# Patient Record
Sex: Female | Born: 1935
Health system: Southern US, Community
[De-identification: ages and names within clinical notes are randomized; demographics above are authoritative.]

## PROBLEM LIST (undated history)

## (undated) DIAGNOSIS — K2980 Duodenitis without bleeding: Secondary | ICD-10-CM

## (undated) DIAGNOSIS — E785 Hyperlipidemia, unspecified: Secondary | ICD-10-CM

## (undated) DIAGNOSIS — R0989 Other specified symptoms and signs involving the circulatory and respiratory systems: Secondary | ICD-10-CM

## (undated) DIAGNOSIS — K449 Diaphragmatic hernia without obstruction or gangrene: Secondary | ICD-10-CM

## (undated) DIAGNOSIS — B9681 Helicobacter pylori [H. pylori] as the cause of diseases classified elsewhere: Secondary | ICD-10-CM

## (undated) DIAGNOSIS — D126 Benign neoplasm of colon, unspecified: Secondary | ICD-10-CM

## (undated) DIAGNOSIS — K2970 Gastritis, unspecified, without bleeding: Secondary | ICD-10-CM

## (undated) DIAGNOSIS — C4491 Basal cell carcinoma of skin, unspecified: Secondary | ICD-10-CM

## (undated) DIAGNOSIS — Z9289 Personal history of other medical treatment: Secondary | ICD-10-CM

## (undated) DIAGNOSIS — I2510 Atherosclerotic heart disease of native coronary artery without angina pectoris: Secondary | ICD-10-CM

## (undated) DIAGNOSIS — K279 Peptic ulcer, site unspecified, unspecified as acute or chronic, without hemorrhage or perforation: Secondary | ICD-10-CM

## (undated) DIAGNOSIS — N6019 Diffuse cystic mastopathy of unspecified breast: Secondary | ICD-10-CM

## (undated) DIAGNOSIS — I219 Acute myocardial infarction, unspecified: Secondary | ICD-10-CM

## (undated) DIAGNOSIS — C4492 Squamous cell carcinoma of skin, unspecified: Secondary | ICD-10-CM

## (undated) DIAGNOSIS — K5790 Diverticulosis of intestine, part unspecified, without perforation or abscess without bleeding: Secondary | ICD-10-CM

## (undated) DIAGNOSIS — K219 Gastro-esophageal reflux disease without esophagitis: Secondary | ICD-10-CM

## (undated) HISTORY — DX: Other specified symptoms and signs involving the circulatory and respiratory systems: R09.89

## (undated) HISTORY — DX: Helicobacter pylori (H. pylori) as the cause of diseases classified elsewhere: B96.81

## (undated) HISTORY — PX: DILATION AND CURETTAGE OF UTERUS: SHX78

## (undated) HISTORY — DX: Diffuse cystic mastopathy of unspecified breast: N60.19

## (undated) HISTORY — PX: BASAL CELL CARCINOMA EXCISION: SHX1214

## (undated) HISTORY — DX: Gastro-esophageal reflux disease without esophagitis: K21.9

## (undated) HISTORY — DX: Personal history of other medical treatment: Z92.89

## (undated) HISTORY — DX: Peptic ulcer, site unspecified, unspecified as acute or chronic, without hemorrhage or perforation: K27.9

## (undated) HISTORY — DX: Hyperlipidemia, unspecified: E78.5

## (undated) HISTORY — DX: Benign neoplasm of colon, unspecified: D12.6

## (undated) HISTORY — DX: Diverticulosis of intestine, part unspecified, without perforation or abscess without bleeding: K57.90

## (undated) HISTORY — DX: Atherosclerotic heart disease of native coronary artery without angina pectoris: I25.10

## (undated) HISTORY — DX: Acute myocardial infarction, unspecified: I21.9

## (undated) HISTORY — DX: Diaphragmatic hernia without obstruction or gangrene: K44.9

## (undated) HISTORY — DX: Basal cell carcinoma of skin, unspecified: C44.91

## (undated) HISTORY — DX: Duodenitis without bleeding: K29.80

## (undated) HISTORY — DX: Gastritis, unspecified, without bleeding: K29.70

---
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
## 1982-10-08 HISTORY — PX: CHOLECYSTECTOMY: SHX55

## 1991-10-09 DIAGNOSIS — I219 Acute myocardial infarction, unspecified: Secondary | ICD-10-CM

## 1991-10-09 HISTORY — PX: ANGIOPLASTY: SHX39

## 1991-10-09 HISTORY — DX: Acute myocardial infarction, unspecified: I21.9

## 1998-02-07 ENCOUNTER — Other Ambulatory Visit: Admission: RE | Admit: 1998-02-07 | Discharge: 1998-02-07 | Payer: Self-pay | Admitting: *Deleted

## 1998-04-15 ENCOUNTER — Ambulatory Visit (HOSPITAL_COMMUNITY): Admission: RE | Admit: 1998-04-15 | Discharge: 1998-04-15 | Payer: Self-pay | Admitting: *Deleted

## 1998-09-21 ENCOUNTER — Other Ambulatory Visit: Admission: RE | Admit: 1998-09-21 | Discharge: 1998-09-21 | Payer: Self-pay | Admitting: Obstetrics and Gynecology

## 1999-03-21 ENCOUNTER — Encounter (INDEPENDENT_AMBULATORY_CARE_PROVIDER_SITE_OTHER): Payer: Self-pay | Admitting: *Deleted

## 1999-03-21 ENCOUNTER — Emergency Department (HOSPITAL_COMMUNITY): Admission: EM | Admit: 1999-03-21 | Discharge: 1999-03-21 | Payer: Self-pay | Admitting: Emergency Medicine

## 1999-03-22 ENCOUNTER — Other Ambulatory Visit: Admission: RE | Admit: 1999-03-22 | Discharge: 1999-03-22 | Payer: Self-pay | Admitting: Obstetrics and Gynecology

## 1999-05-10 ENCOUNTER — Other Ambulatory Visit: Admission: RE | Admit: 1999-05-10 | Discharge: 1999-05-10 | Payer: Self-pay | Admitting: Obstetrics and Gynecology

## 1999-05-10 ENCOUNTER — Encounter (INDEPENDENT_AMBULATORY_CARE_PROVIDER_SITE_OTHER): Payer: Self-pay | Admitting: Specialist

## 1999-10-10 ENCOUNTER — Other Ambulatory Visit: Admission: RE | Admit: 1999-10-10 | Discharge: 1999-10-10 | Payer: Self-pay | Admitting: *Deleted

## 2000-01-17 ENCOUNTER — Other Ambulatory Visit: Admission: RE | Admit: 2000-01-17 | Discharge: 2000-01-17 | Payer: Self-pay | Admitting: *Deleted

## 2000-10-08 DIAGNOSIS — D126 Benign neoplasm of colon, unspecified: Secondary | ICD-10-CM

## 2000-10-08 HISTORY — DX: Benign neoplasm of colon, unspecified: D12.6

## 2001-01-06 ENCOUNTER — Other Ambulatory Visit: Admission: RE | Admit: 2001-01-06 | Discharge: 2001-01-06 | Payer: Self-pay | Admitting: *Deleted

## 2001-03-07 ENCOUNTER — Other Ambulatory Visit: Admission: RE | Admit: 2001-03-07 | Discharge: 2001-03-07 | Payer: Self-pay | Admitting: Gastroenterology

## 2001-03-07 ENCOUNTER — Encounter (INDEPENDENT_AMBULATORY_CARE_PROVIDER_SITE_OTHER): Payer: Self-pay | Admitting: Specialist

## 2002-01-06 ENCOUNTER — Other Ambulatory Visit: Admission: RE | Admit: 2002-01-06 | Discharge: 2002-01-06 | Payer: Self-pay | Admitting: Obstetrics and Gynecology

## 2002-10-08 DIAGNOSIS — D126 Benign neoplasm of colon, unspecified: Secondary | ICD-10-CM

## 2002-10-08 HISTORY — DX: Benign neoplasm of colon, unspecified: D12.6

## 2003-01-19 ENCOUNTER — Encounter (INDEPENDENT_AMBULATORY_CARE_PROVIDER_SITE_OTHER): Payer: Self-pay

## 2003-01-19 ENCOUNTER — Ambulatory Visit (HOSPITAL_COMMUNITY): Admission: RE | Admit: 2003-01-19 | Discharge: 2003-01-19 | Payer: Self-pay | Admitting: Obstetrics and Gynecology

## 2003-02-08 ENCOUNTER — Other Ambulatory Visit: Admission: RE | Admit: 2003-02-08 | Discharge: 2003-02-08 | Payer: Self-pay | Admitting: Obstetrics and Gynecology

## 2004-08-24 ENCOUNTER — Ambulatory Visit: Payer: Self-pay | Admitting: Family Medicine

## 2004-08-24 ENCOUNTER — Encounter: Admission: RE | Admit: 2004-08-24 | Discharge: 2004-08-24 | Payer: Self-pay | Admitting: Family Medicine

## 2004-09-11 ENCOUNTER — Ambulatory Visit: Payer: Self-pay | Admitting: Family Medicine

## 2005-10-04 ENCOUNTER — Ambulatory Visit: Payer: Self-pay | Admitting: Internal Medicine

## 2005-10-20 ENCOUNTER — Emergency Department (HOSPITAL_COMMUNITY): Admission: EM | Admit: 2005-10-20 | Discharge: 2005-10-20 | Payer: Self-pay | Admitting: Emergency Medicine

## 2005-10-20 ENCOUNTER — Ambulatory Visit: Payer: Self-pay | Admitting: Internal Medicine

## 2006-01-08 ENCOUNTER — Ambulatory Visit: Payer: Self-pay | Admitting: Internal Medicine

## 2006-01-30 ENCOUNTER — Ambulatory Visit: Payer: Self-pay | Admitting: Gastroenterology

## 2006-02-11 ENCOUNTER — Ambulatory Visit: Payer: Self-pay | Admitting: Gastroenterology

## 2006-04-12 ENCOUNTER — Ambulatory Visit: Payer: Self-pay | Admitting: Internal Medicine

## 2008-05-27 ENCOUNTER — Ambulatory Visit: Payer: Self-pay | Admitting: Family Medicine

## 2008-05-27 DIAGNOSIS — M549 Dorsalgia, unspecified: Secondary | ICD-10-CM | POA: Insufficient documentation

## 2008-05-27 DIAGNOSIS — E785 Hyperlipidemia, unspecified: Secondary | ICD-10-CM

## 2008-05-27 LAB — CONVERTED CEMR LAB
Bilirubin Urine: NEGATIVE
Blood in Urine, dipstick: NEGATIVE
Glucose, Urine, Semiquant: NEGATIVE
Ketones, urine, test strip: NEGATIVE
Nitrite: NEGATIVE
Protein, U semiquant: NEGATIVE
Specific Gravity, Urine: 1.025
Urobilinogen, UA: 0.2
pH: 5

## 2008-05-28 ENCOUNTER — Telehealth (INDEPENDENT_AMBULATORY_CARE_PROVIDER_SITE_OTHER): Payer: Self-pay | Admitting: *Deleted

## 2008-05-31 ENCOUNTER — Telehealth (INDEPENDENT_AMBULATORY_CARE_PROVIDER_SITE_OTHER): Payer: Self-pay | Admitting: *Deleted

## 2008-06-21 ENCOUNTER — Telehealth (INDEPENDENT_AMBULATORY_CARE_PROVIDER_SITE_OTHER): Payer: Self-pay | Admitting: *Deleted

## 2008-06-23 ENCOUNTER — Encounter: Admission: RE | Admit: 2008-06-23 | Discharge: 2008-06-23 | Payer: Self-pay | Admitting: Family Medicine

## 2008-06-24 ENCOUNTER — Encounter (INDEPENDENT_AMBULATORY_CARE_PROVIDER_SITE_OTHER): Payer: Self-pay | Admitting: *Deleted

## 2008-07-05 ENCOUNTER — Telehealth: Payer: Self-pay | Admitting: Family Medicine

## 2008-07-13 ENCOUNTER — Encounter: Payer: Self-pay | Admitting: Family Medicine

## 2008-08-19 ENCOUNTER — Ambulatory Visit: Payer: Self-pay | Admitting: Internal Medicine

## 2008-11-26 ENCOUNTER — Encounter: Payer: Self-pay | Admitting: Family Medicine

## 2009-09-19 ENCOUNTER — Ambulatory Visit: Payer: Self-pay | Admitting: Family Medicine

## 2009-09-19 DIAGNOSIS — K449 Diaphragmatic hernia without obstruction or gangrene: Secondary | ICD-10-CM | POA: Insufficient documentation

## 2009-09-19 DIAGNOSIS — R634 Abnormal weight loss: Secondary | ICD-10-CM | POA: Insufficient documentation

## 2009-09-19 DIAGNOSIS — Z8711 Personal history of peptic ulcer disease: Secondary | ICD-10-CM | POA: Insufficient documentation

## 2009-09-20 ENCOUNTER — Encounter (INDEPENDENT_AMBULATORY_CARE_PROVIDER_SITE_OTHER): Payer: Self-pay | Admitting: *Deleted

## 2009-09-21 ENCOUNTER — Encounter (INDEPENDENT_AMBULATORY_CARE_PROVIDER_SITE_OTHER): Payer: Self-pay | Admitting: *Deleted

## 2009-09-21 LAB — CONVERTED CEMR LAB
ALT: 17 units/L (ref 0–35)
AST: 25 units/L (ref 0–37)
Albumin: 4.1 g/dL (ref 3.5–5.2)
Alkaline Phosphatase: 48 units/L (ref 39–117)
BUN: 15 mg/dL (ref 6–23)
Basophils Absolute: 0.1 10*3/uL (ref 0.0–0.1)
Basophils Relative: 1.5 % (ref 0.0–3.0)
Bilirubin, Direct: 0.1 mg/dL (ref 0.0–0.3)
CO2: 30 meq/L (ref 19–32)
Calcium: 9.9 mg/dL (ref 8.4–10.5)
Chloride: 104 meq/L (ref 96–112)
Creatinine, Ser: 0.9 mg/dL (ref 0.4–1.2)
Eosinophils Absolute: 0.2 10*3/uL (ref 0.0–0.7)
Eosinophils Relative: 3.5 % (ref 0.0–5.0)
GFR calc non Af Amer: 64.64 mL/min (ref >60)
Glucose, Bld: 98 mg/dL (ref 70–99)
HCT: 40.7 % (ref 36.0–46.0)
Hemoglobin: 13.6 g/dL (ref 12.0–15.0)
Lymphocytes Relative: 32.6 % (ref 12.0–46.0)
Lymphs Abs: 1.8 10*3/uL (ref 0.7–4.0)
MCHC: 33.4 g/dL (ref 30.0–36.0)
MCV: 98.7 fL (ref 78.0–100.0)
Monocytes Absolute: 0.8 10*3/uL (ref 0.1–1.0)
Monocytes Relative: 14.1 % — ABNORMAL HIGH (ref 3.0–12.0)
Neutro Abs: 2.5 10*3/uL (ref 1.4–7.7)
Neutrophils Relative %: 48.3 % (ref 43.0–77.0)
Platelets: 146 10*3/uL — ABNORMAL LOW (ref 150.0–400.0)
Potassium: 3.9 meq/L (ref 3.5–5.1)
RBC: 4.13 M/uL (ref 3.87–5.11)
RDW: 12.7 % (ref 11.5–14.6)
Sodium: 142 meq/L (ref 135–145)
TSH: 1.32 microintl units/mL (ref 0.35–5.50)
Total Bilirubin: 0.8 mg/dL (ref 0.3–1.2)
Total Protein: 7.2 g/dL (ref 6.0–8.3)
WBC: 5.4 10*3/uL (ref 4.5–10.5)

## 2009-10-12 ENCOUNTER — Ambulatory Visit: Payer: Self-pay | Admitting: Family Medicine

## 2009-10-12 ENCOUNTER — Encounter (INDEPENDENT_AMBULATORY_CARE_PROVIDER_SITE_OTHER): Payer: Self-pay | Admitting: *Deleted

## 2009-10-12 LAB — CONVERTED CEMR LAB
OCCULT 1: NEGATIVE
OCCULT 2: NEGATIVE
OCCULT 3: NEGATIVE

## 2009-10-14 DIAGNOSIS — K5730 Diverticulosis of large intestine without perforation or abscess without bleeding: Secondary | ICD-10-CM | POA: Insufficient documentation

## 2009-10-14 DIAGNOSIS — Z860100 Personal history of colon polyps, unspecified: Secondary | ICD-10-CM | POA: Insufficient documentation

## 2009-10-18 ENCOUNTER — Ambulatory Visit: Payer: Self-pay | Admitting: Gastroenterology

## 2009-10-18 DIAGNOSIS — R1013 Epigastric pain: Secondary | ICD-10-CM | POA: Insufficient documentation

## 2009-10-18 DIAGNOSIS — Z9089 Acquired absence of other organs: Secondary | ICD-10-CM | POA: Insufficient documentation

## 2009-10-19 ENCOUNTER — Ambulatory Visit: Payer: Self-pay | Admitting: Gastroenterology

## 2009-10-19 DIAGNOSIS — K2970 Gastritis, unspecified, without bleeding: Secondary | ICD-10-CM | POA: Insufficient documentation

## 2009-10-19 DIAGNOSIS — K2990 Gastroduodenitis, unspecified, without bleeding: Secondary | ICD-10-CM

## 2009-10-19 LAB — CONVERTED CEMR LAB: UREASE: NEGATIVE

## 2009-10-26 HISTORY — PX: CATARACT EXTRACTION: SUR2

## 2009-11-01 ENCOUNTER — Telehealth: Payer: Self-pay | Admitting: Gastroenterology

## 2009-11-09 HISTORY — PX: CATARACT EXTRACTION: SUR2

## 2009-11-28 ENCOUNTER — Encounter: Payer: Self-pay | Admitting: Family Medicine

## 2010-10-11 ENCOUNTER — Encounter: Payer: Self-pay | Admitting: Family Medicine

## 2010-10-29 ENCOUNTER — Encounter (HOSPITAL_COMMUNITY): Payer: Self-pay | Admitting: Oncology

## 2010-11-07 NOTE — Procedures (Signed)
Summary: Upper Endoscopy  Patient: [name] Note: All result statuses are Final unless otherwise noted.  Tests: (1) Upper Endoscopy (EGD)   EGD Upper Endoscopy       DONE      [facility]     [address]     [place], [place]  [address]           ENDOSCOPY PROCEDURE REPORT           PATIENT:  Rachel Nichols, Rachel Nichols  MR#:  [id number]     BIRTHDATE:  06/04/1931, 78 yrs. old  GENDER:  female           ENDOSCOPIST:  Vania Rea. Jarold Motto, MD, [facility]     Referred by:           PROCEDURE DATE:  10/19/2009     PROCEDURE:  EGD with biopsy     ASA CLASS:  Class II     INDICATIONS:  abdominal pain           MEDICATIONS:   Fentanyl 25 mcg IV, Versed 2 mg IV, glycopyrrolate     (Robinal) 0.2 mg IV     TOPICAL ANESTHETIC:  Exactacain Spray           DESCRIPTION OF PROCEDURE:   After the risks benefits and     alternatives of the procedure were thoroughly explained, informed     consent was obtained.  The LB [id number]-[id number] [id number] endoscope was     introduced through the mouth and advanced to the second portion of     the duodenum, without limitations.  The instrument was slowly     withdrawn as the mucosa was fully examined.     <<PROCEDUREIMAGES>>           Duodenitis was found in the bulb of the duodenum. LINEAR EROSION     IN BULB AND SCARRING WITH DEFORMITY OF BULB.  Moderate gastritis     was found in the fundus. CLO AND REGULAR BIOPSIES DONE FOR     H.PYLORI.  Normal duodenal folds were noted.  The esophagus and     gastroesophageal junction were completely normal in appearance.     Retroflexed views revealed no masses.    The scope was then withdrawn     from the patient and the procedure completed.           COMPLICATIONS:  None           ENDOSCOPIC IMPRESSION:     1) Duodenitis in the bulb of duodenum     2) Moderate gastritis in the fundus     3) Normal duodenal folds     4) Normal esophagus     5) No masses     RECURRENT DUODENAL ULCER DISEASE.PROBABLE H.PYLORI.  RECOMMENDATIONS:     1) continue PPI     2) follow-up of helicobacter pylori status, treat if indicated     3) Rx CLO if positive           REPEAT EXAM:  No           ______________________________     Vania Rea. Jarold Motto, MD, [facility]           CC:  Lelon Perla, DO           n.     eSIGNED:   Vania Rea. Navpreet Szczygiel at 10/19/2009 02:22 PM           Lerry Liner, [phone]  Note: An exclamation mark Marland Kitchen)  indicates a result that was not dispersed into the flowsheet. Document Creation Date: 10/19/2009 2:22 PM _______________________________________________________________________  (1) Order result status: Final Collection or observation date-time: 10/19/2009 14:14 Requested date-time:  Receipt date-time:  Reported date-time:  Referring Physician:   Ordering Physician: Sheryn Bison [phone]) Specimen Source:  Source: Launa Grill Order Number: [id number] Lab site:

## 2010-11-07 NOTE — Assessment & Plan Note (Signed)
Summary: WEIGHTLOSS AND HERNIA--CH   History of Present Illness Visit Type: Initial Consult Primary GI MD: Sheryn Bison MD FACP FAGA Primary Provider: Loreen Freud, MD Requesting Provider: Loreen Freud, MD Chief Complaint: Patient referred for epigastric pain and increased acid reflux. Dr. Laury Axon gave her samples of Dexilant which she has been taking since her last office visit, with the exception of the last 6 days. She stopped the Dexilant. She states she has had a 5lb weight loss since summer. She has a history of hiatal hernia and doudenal ulcer in the the 70s.  History of Present Illness:   This patient is a pleasant 77 year old white female referred through the courtesy of Dr. Laury Axon for evaluation of epigastric abdominal pain which began in late November and lasted approximately 6 weeks and is responsive to PPI therapy. Her pain was a dull in sensation in epigastric area without radiation, relieved by antacids and food, and was associated with nocturnal awakening. She denies use of aspirin or salicylates. She was told she had a peptic ulcer by upper GI series in the 1970s. She denies a history of gastrointestinal hemorrhage. Recently there has been 4-5 pound weight loss despite good p.o. intake. There has been no melena, hematochezia, or hepatobiliary complaints. Cholecystectomy was performed in 1984. She is up-to-date on colonoscopy exams and has none diverticulosis coli.  Medical problems include Rosacea with weekly administration of minocycline, hypertensive cardiovascular disease with previous angioplasty, hypertension, hyperlipidemia, and osteopenia.   GI Review of Systems    Reports abdominal pain and  weight loss.     Location of  Abdominal pain: epigastric area. Weight loss of 5 pounds over 6 months.   Denies acid reflux, belching, bloating, chest pain, dysphagia with liquids, dysphagia with solids, heartburn, loss of appetite, nausea, vomiting, vomiting blood, and  weight gain.         Denies anal fissure, black tarry stools, change in bowel habit, constipation, diarrhea, diverticulosis, fecal incontinence, heme positive stool, hemorrhoids, irritable bowel syndrome, jaundice, light color stool, liver problems, rectal bleeding, and  rectal pain.    Current Medications (verified): 1)  Atenolol 25 Mg  Tabs (Atenolol) .... Take 1/2 Tab Once Daily 2)  Zetia 10 Mg  Tabs (Ezetimibe) .... Take 1 Tab Once Daily 3)  Protegra Cardio   Tabs (Multiple Vitamins-Minerals) .... Take 1 Tab Once Daily 4)  Vitamin D 1000 Unit Tabs (Cholecalciferol) .... Take One By Mouth Once Daily 5)  Minocycline Hcl 100 Mg  Caps (Minocycline Hcl) .... Take 2 Tabs Weekly 6)  Crestor 5 Mg Tabs (Rosuvastatin Calcium) .Marland Kitchen.. 1 By Mouth Daily 7)  Dexilant 60 Mg Cpdr (Dexlansoprazole) .Marland Kitchen.. 1 By Mouth Once Daily  Allergies (verified): 1)  ! Ultram 2)  ! Cardizem  Past History:  Past medical, surgical, family and social histories (including risk factors) reviewed for relevance to current acute and chronic problems.  Past Medical History: Reviewed history from 10/14/2009 and no changes required. Hyperlipidemia Myocardial infarction, hx of (1993)--cardiolyte stress--- Dr Tresa Endo  Current Problems:  DIVERTICULOSIS, COLON (ICD-562.10) COLONIC POLYPS, ADENOMATOUS, HX OF (ICD-V12.72) WEIGHT LOSS (ICD-783.21) PUD, HX OF (ICD-V12.71) HIATAL HERNIA WITH REFLUX (ICD-553.3) HYPERLIPIDEMIA (ICD-272.4) BACK PAIN (ICD-724.5)  Past Surgical History: angioplasty 1993 Cholecystectomy 1984  Family History: Reviewed history from 09/19/2009 and no changes required. Brother ---bacteria attacked heart and lungs Family History of Breast Cancer: maternal aunts Family History of Inflammatory Bowel Disease: brother Family History of Heart Disease: brother, sister  Social History: Reviewed history from 05/27/2008 and no changes  required. 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  The patient denies allergy/sinus, anemia, anxiety-new, blood in urine, breast changes/lumps, change in vision, confusion, cough, coughing up blood, depression-new, fainting, fatigue, fever, headaches-new, hearing problems, heart rhythm changes, itching, menstrual pain, muscle pains/cramps, night sweats, nosebleeds, pregnancy symptoms, shortness of breath, skin rash, sleeping problems, sore throat, swelling of feet/legs, swollen lymph glands, thirst - excessive , urination - excessive , urination changes/pain, urine leakage, vision changes, and voice change.   General:  Denies fever, chills, sweats, anorexia, fatigue, weakness, malaise, weight loss, and sleep disorder. Eyes:  Denies blurring, diplopia, irritation, discharge, vision loss, scotoma, eye pain, and photophobia. ENT:  Denies earache, ear discharge, tinnitus, decreased hearing, nasal congestion, loss of smell, nosebleeds, sore throat, hoarseness, and difficulty swallowing. CV:  Denies chest pains, angina, palpitations, syncope, dyspnea on exertion, orthopnea, PND, peripheral edema, and claudication. Resp:  Denies dyspnea at rest, dyspnea with exercise, cough, sputum, wheezing, coughing up blood, and pleurisy. GI:  Denies difficulty swallowing, pain on swallowing, nausea, indigestion/heartburn, vomiting, vomiting blood, abdominal pain, jaundice, gas/bloating, diarrhea, constipation, change in bowel habits, bloody BM's, black BMs, and fecal incontinence. GU:  Denies urinary burning, blood in urine, nocturnal urination, urinary frequency, urinary incontinence, abnormal vaginal bleeding, amenorrhea, menorrhagia, vaginal discharge, pelvic pain, genital sores, painful intercourse, and decreased libido. MS:  Complains of joint stiffness and low back pain; denies joint pain /  LOM, joint swelling, joint deformity, muscle weakness, muscle cramps, muscle atrophy, leg pain at night, leg pain with exertion, and shoulder pain / LOM hand / wrist pain (CTS). Derm:  Complains of dry skin; denies rash, itching, hives, moles, warts, and unhealing ulcers. Neuro:  Denies weakness, paralysis, abnormal sensation, seizures, syncope, tremors, vertigo, transient blindness, frequent falls, frequent headaches, difficulty walking, headache, sciatica, radiculopathy other:, restless legs, memory loss, and confusion. Psych:  Denies depression, anxiety, memory loss, suicidal ideation, hallucinations, paranoia, phobia, and confusion. Endo:  Denies cold intolerance, heat intolerance, polydipsia, polyphagia, polyuria, unusual weight change, and hirsutism. Heme:  Denies bruising, bleeding, enlarged lymph nodes, and pagophagia.  Vital Signs:  Patient profile:   77 year old female Height:      64 inches Weight:      131.8 pounds BMI:     22.71 Pulse rate:   60 / minute Pulse rhythm:   regular BP sitting:   94 / 54  (left arm) Cuff size:   regular  Vitals Entered By: Harlow Mares CMA Duncan Dull) (October 18, 2009 10:35 AM)  Physical Exam  General:  Well developed, well nourished, no acute distress.healthy appearing.   Head:  Normocephalic and atraumatic. Eyes:  PERRLA, no icterus.exam deferred to patient's ophthalmologist.   Neck:  Supple; no masses or thyromegaly. Lungs:  Clear throughout to auscultation. Heart:  Regular rate and rhythm; no murmurs, rubs,  or bruits. Abdomen:  Soft, nontender and nondistended. No masses, hepatosplenomegaly or hernias noted. Normal bowel sounds. Msk:  Symmetrical with no gross deformities. Normal posture. Pulses:  Normal pulses noted. Extremities:  No clubbing, cyanosis, edema or deformities noted. Neurologic:  Alert and  oriented x4;  grossly normal neurologically. Cervical Nodes:  No significant cervical adenopathy. Inguinal Nodes:  No significant  inguinal adenopathy. Psych:  Alert and cooperative. Normal mood and affect.   Impression & Recommendations:  Problem #  1:  ABDOMINAL PAIN-EPIGASTRIC (ICD-789.06) Assessment Improved  Her history and symptomatology are consistent with relapsing peptic ulcer disease and probable H. pylori infection. We will perform endoscopy for diagnostic purposes and check her for Helicobacter infection. If present, would recommend triple drug therapy per the relapsing nature of her problem and also for carcinoma prophylaxis. She currently is asymptomatic and I reviewed her record in detail and her labs, she is not anemic there is no evidence of other metabolic derangements. She actually is eating well and her weight is stable. There is a vague history of her brother recently dying during hospitalization with a unknown "bacterial infection". Patient denies use of NSAIDs or salicylates and quit smoking greater than 25 years ago. She does not abuse ethanol.  Orders: EGD (EGD)  Problem # 2:  DIVERTICULOSIS, COLON (ICD-562.10) Assessment: Improved high-fiber diet as tolerated.  Problem # 3:  CHOLECYSTECTOMY, HX OF (ICD-V45.79) Assessment: Unchanged There are no current hepatobiliary problems. She has a well-healed right upper quadrant scar from previous surgery. Additional surgeries have included appendectomy and removal of her right ovary. She denies gynecologic problems at this time.  Patient Instructions: 1)  Copy sent to : Dr.  Loreen Freud 2)  Please continue current medications.  3)  Conscious Sedation brochure given.  4)  Upper Endoscopy brochure given.  5)  High Fiber, Low Fat  Healthy Eating Plan brochure given.  6)  Return tomorrow afternoon for EGD. 7)  The medication list was reviewed and reconciled.  All changed / newly prescribed medications were explained.  A complete medication list was provided to the patient / caregiver.

## 2010-11-07 NOTE — Progress Notes (Signed)
Summary: Prevpak  Phone Note Outgoing Call   Call placed by: Ashok Cordia RN,  November 01, 2009 12:05 PM Summary of Call: Talked with pt re treatment for hpylori.  Pt had cataract surgery on 10/26/09 and she is scheduled to have surgery on ther other eye on 11/09/09.  Pt asks if it sould be OK to wait until after the second surgery to start the prevpak?  She will be taking antiobiotic eye gtts. Initial call taken by: Ashok Cordia RN,  November 01, 2009 12:07 PM    Additional Follow-up for Phone Call Additional follow up Details #2::    yes Follow-up by: Mardella Layman MD FACG,  November 01, 2009 12:21 PM  Additional Follow-up for Phone Call Additional follow up Details #3:: Details for Additional Follow-up Action Taken: Pt notified.  Pt request Rx be sent to [facility] for Prevpak.  Rx sent as requested. Additional Follow-up by: Ashok Cordia RN,  November 01, 2009 1:32 PM  New/Updated Medications: PREVPAC   MISC (AMOXICILL-CLARITHRO-LANSOPRAZ) take as directed Prescriptions: PREVPAC   MISC (AMOXICILL-CLARITHRO-LANSOPRAZ) take as directed  #1 x 0   Entered by:   Ashok Cordia RN   Authorized by:   Mardella Layman MD [facility]   Signed by:   Ashok Cordia RN on 11/01/2009   Method used:   Electronically to        [facility]* (mail-order)             ,          Ph: [phone]       Fax: [phone]   RxID:   [id number]

## 2010-11-07 NOTE — Letter (Signed)
Summary: Results Follow up Letter  [facility] [place]  [address] [place], [place] [address]   Phone: [phone]  Fax: [phone]    10/12/2009 MRN: [id number]  Rachel Nichols [address] [place], [place]  [address]  Dear Rachel Nichols,  The following are the results of your recent test(s):  Test         Result    Pap Smear:        Normal _____  Not Normal _____ Comments: ______________________________________________________ Cholesterol: LDL(Bad cholesterol):         Your goal is less than:         HDL (Good cholesterol):       Your goal is more than: Comments:  ______________________________________________________ Mammogram:        Normal _____  Not Normal _____ Comments:  ___________________________________________________________________ Hemoccult:        Normal __X___  Not normal _______ Comments:    _____________________________________________________________________ Other Tests:    We routinely do not discuss normal results over the telephone.  If you desire a copy of the results, or you have any questions about this information we can discuss them at your next office visit.   Sincerely,    Army Fossa CMA  October 12, 2009 2:14 PM

## 2010-11-07 NOTE — Procedures (Signed)
Summary: Colon   Colonoscopy  Procedure date:  02/11/2006  Findings:      Location:  [facility].   Patient Name: Rachel Nichols, Rachel Nichols MRN:  Procedure Procedures: Colonoscopy CPT: [phone].  Personnel: Endoscopist: Vania Rea. Jarold Motto, MD.  Exam Location: Exam performed in Outpatient Clinic. Outpatient  Patient Consent: Procedure, Alternatives, Risks and Benefits discussed, consent obtained, from patient. Consent was obtained by the RN.  Indications  Surveillance of: Adenomatous Polyp(s).  History  Current Medications: Patient is not currently taking Coumadin.  Pre-Exam Physical: Performed Feb 11, 2006. Cardio-pulmonary exam, Rectal exam, Abdominal exam, Extremity exam, Mental status exam WNL.  Comments: Pt. history reviewed/updated, physical exam performed prior to initiation of sedation? Exam Exam: Extent of exam reached: Cecum, extent intended: Cecum.  The cecum was identified by appendiceal orifice and IC valve. Patient position: on left side. Duration of exam: 20 minutes. Colon retroflexion performed. Images taken. ASA Classification: II. Tolerance: good.  Monitoring: Pulse and BP monitoring, Oximetry used. Supplemental O2 given. at 2 Liters.  Colon Prep Used Golytely for colon prep. Prep results: excellent.  Sedation Meds: Patient assessed and found to be appropriate for moderate (conscious) sedation. Fentanyl 50 mcg. given IV. Versed 7 mg. given IV.  Instrument(s): CF 140L. Serial [id number].  Findings - DIVERTICULOSIS: Descending Colon to Sigmoid Colon. Not bleeding. ICD9: Diverticulosis, Colon: 562.10. Comments: Small mouthed tics noted...  - NORMAL EXAM: Cecum to Rectum. Not Seen: Polyps. AVM's. Colitis. Tumors. Melanosis. Crohn's. Hemorrhoids. Comments: VERY LOW pain threshold here...   Assessment  Diagnoses: 562.10: Diverticulosis, Colon.   Events  Unplanned Interventions: No intervention was required.  Plans Medication  Plan: Referring provider to order medications.  Patient Education: Patient given standard instructions for: Diverticulosis. Patient instructed to get routine colonoscopy every 5 years.  Disposition: After procedure patient sent to recovery. After recovery patient sent home.  Scheduling/Referral: Follow-Up prn.     cc: Daphene Jaeger, MD     Titus Dubin. Alwyn Ren, MD   This report was created from the original endoscopy report, which was reviewed and signed by the above listed endoscopist.

## 2010-11-07 NOTE — Miscellaneous (Signed)
Summary: clotest  Clinical Lists Changes  Problems: Added new problem of GASTRITIS (ICD-535.50) Orders: Added new Test order of TLB-H Pylori Screen Gastric Biopsy (83013-CLOTEST) - Signed 

## 2010-11-09 NOTE — Letter (Signed)
Summary: [facility]  [facility]   Imported By: Lanelle Bal 10/26/2010 13:47:30  _____________________________________________________________________  External Attachment:    Type:   Image     Comment:   External Document

## 2010-11-29 ENCOUNTER — Encounter: Payer: Self-pay | Admitting: Family Medicine

## 2010-12-25 ENCOUNTER — Other Ambulatory Visit: Payer: Self-pay | Admitting: Obstetrics

## 2011-01-10 ENCOUNTER — Emergency Department (HOSPITAL_COMMUNITY): Payer: Medicare Other

## 2011-01-10 ENCOUNTER — Inpatient Hospital Stay (HOSPITAL_COMMUNITY)
Admission: EM | Admit: 2011-01-10 | Discharge: 2011-01-11 | DRG: 287 | Disposition: A | Discharge status: Home / Self Care | Payer: Medicare Other | Attending: Cardiovascular Disease | Admitting: Cardiovascular Disease

## 2011-01-10 DIAGNOSIS — I2510 Atherosclerotic heart disease of native coronary artery without angina pectoris: Principal | ICD-10-CM | POA: Diagnosis present

## 2011-01-10 DIAGNOSIS — E559 Vitamin D deficiency, unspecified: Secondary | ICD-10-CM | POA: Diagnosis present

## 2011-01-10 DIAGNOSIS — J329 Chronic sinusitis, unspecified: Secondary | ICD-10-CM | POA: Diagnosis present

## 2011-01-10 DIAGNOSIS — I959 Hypotension, unspecified: Secondary | ICD-10-CM | POA: Diagnosis present

## 2011-01-10 DIAGNOSIS — Z7982 Long term (current) use of aspirin: Secondary | ICD-10-CM

## 2011-01-10 DIAGNOSIS — I498 Other specified cardiac arrhythmias: Secondary | ICD-10-CM | POA: Diagnosis present

## 2011-01-10 DIAGNOSIS — I10 Essential (primary) hypertension: Secondary | ICD-10-CM | POA: Diagnosis present

## 2011-01-10 DIAGNOSIS — Z9861 Coronary angioplasty status: Secondary | ICD-10-CM

## 2011-01-10 DIAGNOSIS — I200 Unstable angina: Secondary | ICD-10-CM | POA: Diagnosis present

## 2011-01-10 DIAGNOSIS — Z7902 Long term (current) use of antithrombotics/antiplatelets: Secondary | ICD-10-CM

## 2011-01-10 DIAGNOSIS — E785 Hyperlipidemia, unspecified: Secondary | ICD-10-CM | POA: Diagnosis present

## 2011-01-10 HISTORY — PX: CORONARY STENT PLACEMENT: SHX1402

## 2011-01-10 LAB — PROTIME-INR
INR: 0.98 (ref 0.00–1.49)
Prothrombin Time: 13.2 seconds (ref 11.6–15.2)

## 2011-01-10 LAB — POCT CARDIAC MARKERS
CKMB, poc: 1.3 ng/mL (ref 1.0–8.0)
CKMB, poc: 1.5 ng/mL (ref 1.0–8.0)
Myoglobin, poc: 66.8 ng/mL (ref 12–200)
Myoglobin, poc: 83.8 ng/mL (ref 12–200)
Troponin i, poc: <0.05 ng/mL (ref 0.00–0.09)
Troponin i, poc: <0.05 ng/mL (ref 0.00–0.09)

## 2011-01-10 LAB — DIFFERENTIAL
Basophils Absolute: 0 10*3/uL (ref 0.0–0.1)
Basophils Relative: 0 % (ref 0–1)
Eosinophils Absolute: 0.1 10*3/uL (ref 0.0–0.7)
Eosinophils Relative: 1 % (ref 0–5)
Lymphocytes Relative: 18 % (ref 12–46)
Lymphs Abs: 1.5 10*3/uL (ref 0.7–4.0)

## 2011-01-10 LAB — CK TOTAL AND CKMB (NOT AT ARMC)
CK, MB: 2.1 ng/mL (ref 0.3–4.0)
Relative Index: 1.8 (ref 0.0–2.5)
Total CK: 118 U/L (ref 7–177)

## 2011-01-10 LAB — CBC
HCT: 39.2 % (ref 36.0–46.0)
Hemoglobin: 13.2 g/dL (ref 12.0–15.0)
MCH: 31.5 pg (ref 26.0–34.0)
MCHC: 33.7 g/dL (ref 30.0–36.0)
MCV: 93.6 fL (ref 78.0–100.0)
Platelets: 164 10*3/uL (ref 150–400)
RBC: 4.19 MIL/uL (ref 3.87–5.11)
RDW: 13 % (ref 11.5–15.5)
WBC: 8.5 10*3/uL (ref 4.0–10.5)

## 2011-01-10 LAB — COMPREHENSIVE METABOLIC PANEL
ALT: 19 U/L (ref 0–35)
AST: 25 U/L (ref 0–37)
Alkaline Phosphatase: 47 U/L (ref 39–117)
BUN: 12 mg/dL (ref 6–23)
CO2: 24 mEq/L (ref 19–32)
Calcium: 9.2 mg/dL (ref 8.4–10.5)
Chloride: 108 mEq/L (ref 96–112)
Creatinine, Ser: 0.82 mg/dL (ref 0.4–1.2)
Glucose, Bld: 123 mg/dL — ABNORMAL HIGH (ref 70–99)
Potassium: 3.5 mEq/L (ref 3.5–5.1)
Total Bilirubin: 0.6 mg/dL (ref 0.3–1.2)
Total Protein: 6.5 g/dL (ref 6.0–8.3)

## 2011-01-10 LAB — TROPONIN I: Troponin I: 0.01 ng/mL (ref 0.00–0.06)

## 2011-01-10 LAB — MRSA PCR SCREENING: MRSA by PCR: NEGATIVE

## 2011-01-10 LAB — TSH: TSH: 1.691 u[IU]/mL (ref 0.350–4.500)

## 2011-01-10 LAB — APTT: aPTT: 26 seconds (ref 24–37)

## 2011-01-10 LAB — HEMOGLOBIN A1C: Mean Plasma Glucose: 126 mg/dL — ABNORMAL HIGH (ref <117)

## 2011-01-10 LAB — POCT ACTIVATED CLOTTING TIME: Activated Clotting Time: 334 seconds

## 2011-01-10 LAB — BRAIN NATRIURETIC PEPTIDE: Pro B Natriuretic peptide (BNP): 86 pg/mL (ref 0.0–100.0)

## 2011-01-11 LAB — BASIC METABOLIC PANEL
BUN: 7 mg/dL (ref 6–23)
CO2: 24 mEq/L (ref 19–32)
Calcium: 8.1 mg/dL — ABNORMAL LOW (ref 8.4–10.5)
Chloride: 110 mEq/L (ref 96–112)
Creatinine, Ser: 0.84 mg/dL (ref 0.4–1.2)
GFR calc Af Amer: >60 mL/min (ref >60)
GFR calc non Af Amer: >60 mL/min (ref >60)
Glucose, Bld: 102 mg/dL — ABNORMAL HIGH (ref 70–99)
Potassium: 4 mEq/L (ref 3.5–5.1)
Sodium: 138 mEq/L (ref 135–145)

## 2011-01-11 LAB — CBC
HCT: 36.8 % (ref 36.0–46.0)
Hemoglobin: 12.2 g/dL (ref 12.0–15.0)
MCH: 31.4 pg (ref 26.0–34.0)
MCHC: 33.2 g/dL (ref 30.0–36.0)
MCV: 94.8 fL (ref 78.0–100.0)
Platelets: 146 10*3/uL — ABNORMAL LOW (ref 150–400)
RBC: 3.88 MIL/uL (ref 3.87–5.11)
RDW: 13.2 % (ref 11.5–15.5)
WBC: 7.4 10*3/uL (ref 4.0–10.5)

## 2011-01-11 LAB — LIPID PANEL
Cholesterol: 105 mg/dL (ref 0–200)
LDL Cholesterol: 49 mg/dL (ref 0–99)
Triglycerides: 119 mg/dL (ref <150)
VLDL: 24 mg/dL (ref 0–40)

## 2011-01-16 NOTE — H&P (Signed)
NAME:  Rachel Nichols, Rachel Nichols NO.:  [id number]  MEDICAL RECORD NO.:  [id number]           PATIENT TYPE:  E  LOCATION:  [facility]                         FACILITY:  [facility]  PHYSICIAN:  Nanetta Batty, M.D.   DATE OF BIRTH:  [birth year]  DATE OF ADMISSION:  01/10/2011 DATE OF DISCHARGE:                             HISTORY & PHYSICAL   CHIEF COMPLAINT:  Chest pressure.  HISTORY OF PRESENT ILLNESS:  A 77 year old white married female presents to the ER with chest pressure that began actually yesterday, midsternal without radiation.  It would come and go all night, would wake her from sleep.  This morning, she took a whole aspirin instead of her 81 mg and came to the emergency room.  Here in the ER, they gave her 1 nitroglycerin and the pain completely subsided.  She also complains of lower back pain.  She had no associated symptoms of nausea, diaphoresis, or shortness of breath with this chest discomfort.  She does have a history of coronary artery disease with angioplasty alone to the right coronary artery in 1993.  She also had mild concomitant LAD and circumflex stenosis.  Her last nuclear stress test was on September 13, 2010, at which time she had no ischemia.  Last echo on September 13, 2010, with normal systolic and diastolic function, mild MR and TR and mild aortic valve sclerosis.  The patient does relate she has been packing up her winter clothes and tells they are moving it from the basement all the way upstairs, which may be the cause of her lower back pain and has worked in the yard some as well.  Here in the emergency room, on my exam, she is totally pain free except for some low back discomfort.  Her heart rate, she is sinus brady, mostly in the 50s, and it will drop down to sinus brady in 46. She states at home, her heart rates are in the 50s as low as 52.  Blood pressure here drops down to 87, she has already had 1 L of fluid.  We are giving her in a 250 mL bolus  as well.  The patient had also eaten a sandwich earlier.  Other history includes recently diagnosed with low levels of vitamin D, I believe 50,000 units a week as well as a recent bone density scan and is on medication for decreased bone density.  Also recent normal mammogram.  Also recent sinusitis on amoxicillin.  She also has a history of rosacea.  FAMILY HISTORY:  Not significant to this admission.  SOCIAL HISTORY:  Married with two children, three grandchildren, and four great-grandchildren.  She does have one child that is deceased. She is active.  She swims a lot and active at the house.  No tobacco, no alcohol use.  REVIEW OF SYSTEMS:  No weight changes.  Currently with sinusitis, on amoxicillin.  MUSCULOSKELETAL:  Denies joint pain or musculoskeletal pain.  SKIN:  Without rashes.  HEENT:  No blurred vision or double vision.  PULMONARY:  Denies shortness of breath.  CARDIOVASCULAR: Stated.  GASTROINTESTINAL:  No indigestion, diarrhea,  constipation, or melena.  GENITOURINARY:  No hematuria or dysuria.  She is concerned that she may get some type of infection on the amoxicillin.  ENDOCRINE:  No diabetes or thyroid disease.  NEUROLOGIC:  No syncope, lightheadedness, or dizziness.  ALLERGIES: 1. CARDIZEM. 2. Janean Sark.  OUTPATIENT MEDICATIONS:  Atenolol 12.5 mg p.o. daily, Zetia 10 mg p.o. daily, Crestor 5 mg daily, aspirin 81 mg daily.  She takes minocycline 2 times a week, but she has been holding all of her meds as she is on her amoxicillin for sinusitis.  She is on vitamin D, unsure of the dosage questionable 50,000 units weekly.  She is also on medication for decreased bone density weekly.  I do not have a name or dose of that, but here in the hospital, we will hold it anyway.  PHYSICAL EXAMINATION:  VITAL SIGNS:  Today blood pressure 109/62 to now 86 systolic.  Temp 98, pulse 58-46 sinus brady arrhythmia, respirations 16, temperature 98, and oxygen saturation  97%. GENERAL:  Alert and oriented white female in no acute distress, pleasant affect. SKIN:  Warm and dry.  Brisk capillary refill. HEENT:  Normocephalic.  Sclerae are clear. NECK:  Supple.  No JVD, no bruits. LUNGS:  Clear with few harsh breath sounds.  No rales are noted.  No wheezes. HEART:  Sounds S1, S2 regular rate and rhythm with a faint 1/6 systolic murmur. ABDOMEN:  Soft, nontender.  Positive bowel sounds.  I do not palpate liver, spleen, or masses. LOWER EXTREMITIES:  Without edema, 2+ pedal bilaterally, 2+ radials bilaterally. NEUROLOGIC:  Alert and oriented x3.  Follows commands.  Affect is pleasant.  IMAGING DATA:  EKG; sinus rhythm, sinus brady with a heart rate of 56, and no acute changes from old EKG.  ASSESSMENT: 1. Chest pressure, felt to be cardiac with relief with nitroglycerin     sublingual and recent normal stress test with known coronary     disease with angioplasty in 1993. 2. Dyslipidemia, treated. 3. Sinus bradycardia, somewhat slower now than her normal 52-60.  We     will decrease the dose of atenolol. 4. Hypotension.  We will bolus with another 250 mL saline.  We will     hold off on nitroglycerin currently due to the hypotension.  PLAN:  Discussed with Dr. Allyson Sabal, we will plan cardiac catheterization for today and further plan depending on results.  Again, decrease beta- blocker, I am cutting her to 6.25 twice a day with parameters.  We will also increase IV fluids.  The patient is asymptomatic with the bradycardia.  We will do serial CK/MBs and begin IV heparin.  No nitroglycerin currently secondary to the hypotension.  Please note that Dr. Allyson Sabal has seen her and assisted her with me.     Darcella Gasman. Annie Paras, N.P.   ______________________________ Nanetta Batty, M.D.    LRI/MEDQ  D:  01/10/2011  T:  01/10/2011  Job:  [id number]  cc:   Lelon Perla, DO Eliberto Ivory Rosalio Macadamia, M.D. Dr. Tresa Endo  Electronically Signed by Nada Boozer N.P. on  01/12/2011 08:06:31 AM Electronically Signed by Nanetta Batty M.D. on 01/16/2011 10:45:49 AM

## 2011-01-16 NOTE — Procedures (Signed)
NAME:  Rachel Nichols, Rachel Nichols NO.:  [id number]  MEDICAL RECORD NO.:  [id number]           PATIENT TYPE:  I  LOCATION:  6524                         FACILITY:  [facility]  PHYSICIAN:  Nanetta Batty, M.D.   DATE OF BIRTH:  [birth year]  DATE OF PROCEDURE: DATE OF DISCHARGE:                           CARDIAC CATHETERIZATION   Rachel Nichols is a 77 year old married Caucasian female, patient of Dr. Tresa Endo, history of CAD, status post PCI in the setting of inferior wall myocardial infarction, October 14, 1991.  The patient did have mild LAD and circumflex disease at that time.  She had a recent negative Myoview, September 13, 2010.  She developed unstable angina today, was admitted to the ER.  No acute EKG changes.  She presents now for angiography and potential intervention.  DESCRIPTION OF PROCEDURE:  The patient was brought to the Second [facility] in the postabsorptive state.  She was premedicated with p.o. Valium and IV fentanyl.  Her right groin was prepped and draped in the usual sterile fashion.  A 1% Xylocaine was used for local anesthesia.  A 5-French sheath was inserted into the right femoral artery using standard Seldinger technique.  A 5-French right and left Judkins diagnostic catheter as well as 5-French pigtail catheter were used for selective coronary angiography and left ventriculography respectively.  Visipaque dye was used for the entirety of the case.  Retrograde, aortic, left ventricular, and pullback pressures were recorded.  HEMODYNAMICS: 1. Aortic systolic pressure 127, diastolic pressure 64. 2. Left ventricular systolic pressure 125, end-diastolic pressure 15.  SELECTIVE CORONARY ANGIOGRAPHY: 1. Left main normal. 2. LAD; LAD had a 60% hypodense lesion in the proximal third just     after the first diagonal branch. 3. Circumflex; free of significant disease. 4. Ramus branch; moderate in size and had 80-90% proximal stenosis. 5. Right  coronary artery; dominant with segmental 40-50% segmental     stenosis in the midportion.  LEFT VENTRICULOGRAPHY:  RAO left ventriculogram was performed using 25 mL of Visipaque dye at 12 mL per second.  The overall LVEF is estimated at greater than 60% without focal wall motion abnormalities.  IMPRESSION:  Rachel Nichols has high-grade ramus branch stenosis with moderate left anterior descending disease.  We will proceed with percutaneous coronary intervention and stenting using Resolute drug- eluting stent and Angiomax.  DESCRIPTION OF PROCEDURE:  A 5-French sheath was exchanged over a short wire for a 6-French sheath.  The patient had received 4 chewable aspirin as well as 600 mg of p.o. Plavix and Angiomax bolus with therapeutic ACT.  Using a 6-French XB 3.5 guide catheter along with an 0.014 x 190 [employer]ahi soft wire and 2.25 x 12 Trek balloon, predilatation was performed with nominal pressures.  Following this, a 2.25 x 12 Resolute drug- eluting stent was then deployed at 12 atmospheres (2.4 mm resulting reduction of 80-90% proximal ramus branch stenosis to 0.0% residual). The patient tolerated the procedure well.  There were no hemodynamic or electrocardiographic sequelae.  IMPRESSION:  Successful ramus branch percutaneous coronary intervention and stenting using Resolute drug-eluting stent and  Angiomax in the setting of unstable angina with moderate residual left anterior descending and right coronary artery disease.  The guidewire and catheter removed.  The sheath was then secured in place.  The patient left lab in stable condition.  She will be gently hydrated overnight, treated with aspirin and Plavix, and discharged to home in the morning. She left the lab in stable condition.     Nanetta Batty, M.D.     JB/MEDQ  D:  01/10/2011  T:  01/11/2011  Job:  [id number]  cc:   [facility] Nicki Guadalajara, M.D. Sherry  A. Rosalio Macadamia, M.D. Lelon Perla, DO  Electronically Signed by Nanetta Batty M.D. on 01/16/2011 10:45:52 AM

## 2011-01-18 NOTE — Discharge Summary (Signed)
  NAMEVESPER, TRANT NO.:  [id number]  MEDICAL RECORD NO.:  [id number]           PATIENT TYPE:  I  LOCATION:  6524                         FACILITY:  [facility]  PHYSICIAN:  Nicki Guadalajara, M.D.     DATE OF BIRTH:  07-04-1931  DATE OF ADMISSION:  01/10/2011 DATE OF DISCHARGE:  01/11/2011                              DISCHARGE SUMMARY   DISCHARGE DIAGNOSES: 1. Unstable angina. 2. Coronary artery disease, right coronary artery percutaneous     coronary intervention in 1993, with elective ramus intermedius drug-     eluting stent placement this admission. 3. Treated dyslipidemia. 4. History of sinusitis. 5. Low vitamin D, treated.  HOSPITAL COURSE:  The patient is a 77 year old female, seen by Dr. Tresa Endo in the past.  She had a remote angioplasty in 1993.  She had a recent low-risk Myoview in the office, September 13, 2010, which showed no ischemia.  Echocardiogram shows normal LV function.  She was seen in the emergency room on January 10, 2011, with chest pain which was consistent with unstable angina, see history and physical for complete details. The patient was admitted to telemetry and taken to the cath lab late on January 10, 2011.  This revealed normal LV function, 40-50% mid RCA, 60% LAD after the takeoff of the first diagonal, normal circumflex, and OM with an 80% ramus intermedius stenosis.  This was treated with a drug- eluting stent.  She tolerated the procedure well and we feel she can be discharged on January 11, 2011.  LABS AT DISCHARGE:  White count 7.4, hemoglobin 12.2, hematocrit 36.8, platelets 146.  Sodium 138, potassium 4.0, BUN 7, creatinine 0.84, LDL was 49.  EKG shows sinus rhythm, sinus bradycardia without acute changes.  Please see med rec for complete discharge medications.  DISPOSITION:  The patient was discharged in stable condition and will follow up Dr. Tresa Endo as an outpatient.     Abelino Derrick,  P.A.   ______________________________ Nicki Guadalajara, M.D.    Lenard Lance  D:  01/11/2011  T:  01/11/2011  Job:  [id number]  Electronically Signed by Corine Shelter P.A. on 01/15/2011 12:55:34 PM Electronically Signed by Nicki Guadalajara M.D. on 01/18/2011 10:54:15 AM

## 2011-01-23 ENCOUNTER — Encounter: Payer: Self-pay | Admitting: Family Medicine

## 2011-01-29 ENCOUNTER — Encounter (HOSPITAL_COMMUNITY): Payer: Medicare Other | Attending: Cardiovascular Disease

## 2011-01-29 DIAGNOSIS — Z7902 Long term (current) use of antithrombotics/antiplatelets: Secondary | ICD-10-CM | POA: Insufficient documentation

## 2011-01-29 DIAGNOSIS — I10 Essential (primary) hypertension: Secondary | ICD-10-CM | POA: Insufficient documentation

## 2011-01-29 DIAGNOSIS — Z9861 Coronary angioplasty status: Secondary | ICD-10-CM | POA: Insufficient documentation

## 2011-01-29 DIAGNOSIS — I2510 Atherosclerotic heart disease of native coronary artery without angina pectoris: Secondary | ICD-10-CM | POA: Insufficient documentation

## 2011-01-29 DIAGNOSIS — Z7982 Long term (current) use of aspirin: Secondary | ICD-10-CM | POA: Insufficient documentation

## 2011-01-29 DIAGNOSIS — I498 Other specified cardiac arrhythmias: Secondary | ICD-10-CM | POA: Insufficient documentation

## 2011-01-29 DIAGNOSIS — Z5189 Encounter for other specified aftercare: Secondary | ICD-10-CM | POA: Insufficient documentation

## 2011-01-29 DIAGNOSIS — I200 Unstable angina: Secondary | ICD-10-CM | POA: Insufficient documentation

## 2011-01-29 DIAGNOSIS — E785 Hyperlipidemia, unspecified: Secondary | ICD-10-CM | POA: Insufficient documentation

## 2011-01-31 ENCOUNTER — Encounter (HOSPITAL_COMMUNITY): Payer: Medicare Other

## 2011-02-02 ENCOUNTER — Encounter (HOSPITAL_COMMUNITY): Payer: Medicare Other

## 2011-02-05 ENCOUNTER — Encounter (HOSPITAL_COMMUNITY): Payer: Medicare Other

## 2011-02-07 ENCOUNTER — Encounter (HOSPITAL_COMMUNITY): Payer: Medicare Other | Attending: Cardiovascular Disease

## 2011-02-07 DIAGNOSIS — Z7902 Long term (current) use of antithrombotics/antiplatelets: Secondary | ICD-10-CM | POA: Insufficient documentation

## 2011-02-07 DIAGNOSIS — Z5189 Encounter for other specified aftercare: Secondary | ICD-10-CM | POA: Insufficient documentation

## 2011-02-07 DIAGNOSIS — Z9861 Coronary angioplasty status: Secondary | ICD-10-CM | POA: Insufficient documentation

## 2011-02-07 DIAGNOSIS — I2510 Atherosclerotic heart disease of native coronary artery without angina pectoris: Secondary | ICD-10-CM | POA: Insufficient documentation

## 2011-02-07 DIAGNOSIS — I498 Other specified cardiac arrhythmias: Secondary | ICD-10-CM | POA: Insufficient documentation

## 2011-02-07 DIAGNOSIS — E785 Hyperlipidemia, unspecified: Secondary | ICD-10-CM | POA: Insufficient documentation

## 2011-02-07 DIAGNOSIS — I200 Unstable angina: Secondary | ICD-10-CM | POA: Insufficient documentation

## 2011-02-07 DIAGNOSIS — Z7982 Long term (current) use of aspirin: Secondary | ICD-10-CM | POA: Insufficient documentation

## 2011-02-07 DIAGNOSIS — I10 Essential (primary) hypertension: Secondary | ICD-10-CM | POA: Insufficient documentation

## 2011-02-09 ENCOUNTER — Encounter (HOSPITAL_COMMUNITY): Payer: Medicare Other

## 2011-02-12 ENCOUNTER — Encounter (HOSPITAL_COMMUNITY): Payer: Medicare Other

## 2011-02-14 ENCOUNTER — Encounter (HOSPITAL_COMMUNITY): Payer: Medicare Other

## 2011-02-16 ENCOUNTER — Encounter (HOSPITAL_COMMUNITY): Payer: Medicare Other

## 2011-02-19 ENCOUNTER — Encounter (HOSPITAL_COMMUNITY): Payer: Medicare Other

## 2011-02-21 ENCOUNTER — Encounter (HOSPITAL_COMMUNITY): Payer: Medicare Other

## 2011-02-23 ENCOUNTER — Encounter (HOSPITAL_COMMUNITY): Payer: Medicare Other

## 2011-02-23 NOTE — Consult Note (Signed)
NAMEMAIREAD, SCHWARZKOPF NO.:  [id number]   MEDICAL RECORD NO.:  [id number]          PATIENT TYPE:  EMS   LOCATION:  [facility]                         FACILITY:  [facility]   PHYSICIAN:  Titus Dubin. Alwyn Ren, M.D. [facility] OF BIRTH:  August 10, 1931   DATE OF CONSULTATION:  10/20/2005  DATE OF DISCHARGE:                                   CONSULTATION   Rachel Nichols is a 77 year old white female who began to experience pain in the  left eye the morning of January 12.  She awoke with sensation of sharp,  sticking feeling in the eye as if she had a foreign body in the eye.  This  was associated with swelling around the eye, particularly the lower lid.  She began to use Sulfacet sodium 10% eye drops which she had used for  infection of the right eye in December.   Specifically, she began to have pain in the right eye December 24; she was  seen December 28 and treated with the eye drops and Amoxil 875 mg every 12  hours for 10 days.  At that time, she also had some imbalance symptoms which  resolved with these therapies.  As of January 9, she had no symptoms on the  right.   She denies any fever, chills, sweats, or purulent secretions from the eyes,  nose, or throat.  She has had no visual change.   She did have some pain over the left face and pain in both ears.   She is followed by an optician, Dr. Herbert Moors, but does not have an  ophthalmologist.   She uses Extra-Strength Tylenol as well as the eye drops on the left but  continued to have increased pain and swelling as of January 13, prompting  the call.  Arrangements were made to meet her in the emergency room to rule  out orbital cellulitis.   PAST MEDICAL HISTORY:  1.  Myocardial infarction on 2 occasions in 1993; specifically, January 6      and October 18, 1991.  2.  Cholecystectomy.  3.  Two pregnancies and two deliveries.   MEDICATIONS:  1.  Atenolol 25 mg daily.  2.  Crestor 5 mg daily.  3.  Zetia 10 mg daily.  4.   Enteric-coated aspirin 81 mg.   All meds are from Dr. Tresa Endo.  She is on Tums and calcium supplements.   She is intolerant or allergic to ULTRAM and CARDIZEM CD.   She denies any GI symptoms or cardiopulmonary symptoms.  Specifically, she  had no diarrhea following Amoxil.   PHYSICAL EXAMINATION:  HEENT: On exam, there is obvious swelling around the  left eye, particularly inferiorly.  There is mild chemosis.  No frankly  purulent secretions are noted.  She has full extraocular motion.  Pupils  equal, round, and reactive to light.  There was minimal erythema over the  left inferior lid, and there was tenderness to palpation.  Vision is intact,  and extraocular motion as noted.  Tympanic membranes are dull, but there is  no erythema or discharge.  VITAL SIGNS:  Temperature 96.9, blood pressure 104/60, pulse 58, respiratory  rate 18.  T  LYMPHADENOPATHY:  She has no lymphadenopathy about the head, neck, or  axillae.  CARDIAC: An S4 was noted with no significant murmur.  CHEST:  Clear with no increased work of breathing.   CBC and differential revealed normal white count.   CT scan revealed soft tissue swelling but no orbital cellulitis.   She was placed on ciprofloxacin 0.3% solution, 2 drops in the left eye every  2 hours for 2 days and then every 4 hours while awake for 7 days.  She was  also placed on Augmentin 500 mg every 12 hours to be taken with food.  These  medications were chosen in view of the acute symptoms on the left; symptoms  have progressed despite use of the sodium Sulfacet and having completed the  amoxicillin.  Possible resistant organisms are questioned.   If symptoms fail to resolve or progress, then ophthalmology consultation  will be pursued.  Warning signs were discussed in detail with her.  Specifically, these were to include facial or orbital pain, fever, or  purulent discharge, inability to rotate her eyes was also a warning sign.  She understands these  instructions and knows to call should she fail to  continue to improve.      Titus Dubin. Alwyn Ren, M.D. [facility]  Electronically Signed     [facility]/MEDQ  D:  10/20/2005  T:  10/21/2005  Job:  [id number]   cc:   Nicki Guadalajara, M.D.  Fax: [phone]

## 2011-02-23 NOTE — Assessment & Plan Note (Signed)
[facility]                                 ON-CALL NOTE   Rachel Nichols, Rachel Nichols                           MRN:          [id number]  DATE:02/23/2007                            DOB:          08-27-1931    PRIMARY CARE PHYSICIAN:  Dr. Laury Axon   Rachel Nichols calls in today complaining of a sore throat, congestion, and  headache. She also states that she feels feverish. The reason she called  is to find out if she can take Tylenol extra strength this evening. She  states that this morning she took some Mucinex D. She has no  contraindications in taking Tylenol and she has taken it in the past.  Overall, her symptoms are not severe. She denies any shortness of breath  or chest pain.   PLAN:  I advised the patient that she can go ahead and take Tylenol  extra strength as directed on the bottle. She can call the office  tomorrow if she feels that her symptoms are not improving or if they  worsen in any way. The patient expressed understanding.     Leanne Chang, M.D.  Electronically Signed    LA/MedQ  DD: 02/23/2007  DT: 02/24/2007  Job #: [id number]

## 2011-02-23 NOTE — Op Note (Signed)
NAME:  Rachel Nichols, Rachel Nichols                             ACCOUNT NO.:  [id number]   MEDICAL RECORD NO.:  [id number]                   PATIENT TYPE:  AMB   LOCATION:  [facility]C                                  FACILITY:  [facility]   PHYSICIAN:  Sherry A. Rosalio Macadamia, M.D.           DATE OF BIRTH:  05-23-1931   DATE OF PROCEDURE:  01/19/2003  DATE OF DISCHARGE:                                 OPERATIVE REPORT   PREOPERATIVE DIAGNOSIS:  Post menopausal bleeding, thickened endometrium.   POSTOPERATIVE DIAGNOSIS:  Post menopausal bleeding, thickened endometrium.   PROCEDURE:  D&C hysteroscopy with resectoscope.   SURGEON:  Sherry A. Rosalio Macadamia, M.D.   ANESTHESIA:  MAC.   INDICATIONS:  This is a 77 year old G2 P2-0-0-2 woman who noted the onset of  irregular bleeding while on hormone replacement therapy.  The patient was  taken off of hormone replacement therapy.  However, in March 2004 she had  some vaginal spotting and bleeding after intercourse.  Because of the post  menopausal bleeding, an ultrasound was performed.  Ultrasound revealed an  irregular endometrial cavity with probable polyp present.  Because of this  the patient is brought to the operating room for [facility] hysteroscopy with  resectoscope.   FINDINGS:  Normal size anteflexed uterus with thickened polypoid tissue  along the posterior endometrial wall.  Normal adnexa without mass.   DESCRIPTION OF PROCEDURE:  The patient is brought into the operating room,  given adequate IV sedation which was minimal.  She was placed in a dorsal  lithotomy position.  Her perineum was washed with Hibiclens.  Pelvic  examination was performed.  The patient was draped in sterile fashion.  A  speculum was placed within the vagina.  The vagina was washed with  Hibiclens.  A paracervical block was administered with 1% Nesacaine.  The  anterior lip of the cervix was grasped with a single tooth tenaculum.  The  cervix was sounded.  The cervix was dilated with Pratt  dilators to a #31.  The hysteroscope was introduced into the endometrial cavity.  Pictures were  obtained.  Using a double-loop right angle resector the posterior wall of  the uterus was resected, first removing the thickened endometrial tissue.  Samples from the other endometrial areas were taken.  Adequate hemostasis  was present.  Pictures were obtained before and after resection.  All  instruments were removed from the vagina.  The patient was taken out of the  dorsal lithotomy position.  She was awakened.  She was moved from the  operating table to a stretcher in stable condition.   COMPLICATIONS:  None.   ESTIMATED BLOOD LOSS:  Less than 5 mL.   SORBITOL DIFFERENTIAL:  -60 mL.  Sherry A. Rosalio Macadamia, M.D.   SAD/MEDQ  D:  01/19/2003  T:  01/19/2003  Job:  [id number]

## 2011-02-26 ENCOUNTER — Encounter (HOSPITAL_COMMUNITY): Payer: Medicare Other

## 2011-02-28 ENCOUNTER — Encounter (HOSPITAL_COMMUNITY): Payer: Medicare Other

## 2011-03-02 ENCOUNTER — Encounter (HOSPITAL_COMMUNITY): Payer: Medicare Other

## 2011-03-05 ENCOUNTER — Encounter (HOSPITAL_COMMUNITY): Payer: Medicare Other

## 2011-03-07 ENCOUNTER — Encounter (HOSPITAL_COMMUNITY): Payer: Medicare Other

## 2011-03-09 ENCOUNTER — Encounter (HOSPITAL_COMMUNITY): Payer: Medicare Other

## 2011-03-12 ENCOUNTER — Encounter (HOSPITAL_COMMUNITY): Payer: Medicare Other | Attending: Cardiovascular Disease

## 2011-03-12 DIAGNOSIS — Z5189 Encounter for other specified aftercare: Secondary | ICD-10-CM | POA: Insufficient documentation

## 2011-03-12 DIAGNOSIS — I498 Other specified cardiac arrhythmias: Secondary | ICD-10-CM | POA: Insufficient documentation

## 2011-03-12 DIAGNOSIS — E785 Hyperlipidemia, unspecified: Secondary | ICD-10-CM | POA: Insufficient documentation

## 2011-03-12 DIAGNOSIS — Z7902 Long term (current) use of antithrombotics/antiplatelets: Secondary | ICD-10-CM | POA: Insufficient documentation

## 2011-03-12 DIAGNOSIS — I2510 Atherosclerotic heart disease of native coronary artery without angina pectoris: Secondary | ICD-10-CM | POA: Insufficient documentation

## 2011-03-12 DIAGNOSIS — I10 Essential (primary) hypertension: Secondary | ICD-10-CM | POA: Insufficient documentation

## 2011-03-12 DIAGNOSIS — Z9861 Coronary angioplasty status: Secondary | ICD-10-CM | POA: Insufficient documentation

## 2011-03-12 DIAGNOSIS — Z7982 Long term (current) use of aspirin: Secondary | ICD-10-CM | POA: Insufficient documentation

## 2011-03-12 DIAGNOSIS — I200 Unstable angina: Secondary | ICD-10-CM | POA: Insufficient documentation

## 2011-03-14 ENCOUNTER — Encounter (HOSPITAL_COMMUNITY): Payer: Medicare Other

## 2011-03-16 ENCOUNTER — Encounter (HOSPITAL_COMMUNITY): Payer: Medicare Other

## 2011-03-19 ENCOUNTER — Encounter (HOSPITAL_COMMUNITY): Payer: Medicare Other

## 2011-03-21 ENCOUNTER — Encounter (HOSPITAL_COMMUNITY): Payer: Medicare Other

## 2011-03-23 ENCOUNTER — Encounter (HOSPITAL_COMMUNITY): Payer: Medicare Other

## 2011-03-26 ENCOUNTER — Encounter (HOSPITAL_COMMUNITY): Payer: Medicare Other

## 2011-03-28 ENCOUNTER — Encounter (HOSPITAL_COMMUNITY): Payer: Medicare Other

## 2011-03-30 ENCOUNTER — Encounter (HOSPITAL_COMMUNITY): Payer: Medicare Other

## 2011-04-02 ENCOUNTER — Encounter (HOSPITAL_COMMUNITY): Payer: Medicare Other

## 2011-04-04 ENCOUNTER — Encounter (HOSPITAL_COMMUNITY): Payer: Medicare Other

## 2011-04-06 ENCOUNTER — Encounter (HOSPITAL_COMMUNITY): Payer: Medicare Other

## 2011-04-08 HISTORY — PX: FINGER SURGERY: SHX640

## 2011-04-09 ENCOUNTER — Encounter (HOSPITAL_COMMUNITY): Payer: Medicare Other | Attending: Cardiovascular Disease

## 2011-04-09 DIAGNOSIS — Z7982 Long term (current) use of aspirin: Secondary | ICD-10-CM | POA: Insufficient documentation

## 2011-04-09 DIAGNOSIS — Z5189 Encounter for other specified aftercare: Secondary | ICD-10-CM | POA: Insufficient documentation

## 2011-04-09 DIAGNOSIS — I200 Unstable angina: Secondary | ICD-10-CM | POA: Insufficient documentation

## 2011-04-09 DIAGNOSIS — I10 Essential (primary) hypertension: Secondary | ICD-10-CM | POA: Insufficient documentation

## 2011-04-09 DIAGNOSIS — Z7902 Long term (current) use of antithrombotics/antiplatelets: Secondary | ICD-10-CM | POA: Insufficient documentation

## 2011-04-09 DIAGNOSIS — E785 Hyperlipidemia, unspecified: Secondary | ICD-10-CM | POA: Insufficient documentation

## 2011-04-09 DIAGNOSIS — I498 Other specified cardiac arrhythmias: Secondary | ICD-10-CM | POA: Insufficient documentation

## 2011-04-09 DIAGNOSIS — Z9861 Coronary angioplasty status: Secondary | ICD-10-CM | POA: Insufficient documentation

## 2011-04-09 DIAGNOSIS — I2510 Atherosclerotic heart disease of native coronary artery without angina pectoris: Secondary | ICD-10-CM | POA: Insufficient documentation

## 2011-04-11 ENCOUNTER — Encounter (HOSPITAL_COMMUNITY): Payer: Medicare Other

## 2011-04-13 ENCOUNTER — Encounter (HOSPITAL_COMMUNITY): Payer: Medicare Other

## 2011-04-16 ENCOUNTER — Encounter (HOSPITAL_COMMUNITY): Payer: Medicare Other

## 2011-04-18 ENCOUNTER — Encounter (HOSPITAL_COMMUNITY): Payer: Medicare Other

## 2011-04-20 ENCOUNTER — Encounter (HOSPITAL_COMMUNITY): Payer: Medicare Other

## 2011-04-23 ENCOUNTER — Encounter (HOSPITAL_COMMUNITY): Payer: Medicare Other

## 2011-04-24 ENCOUNTER — Other Ambulatory Visit: Payer: Self-pay | Admitting: Physician Assistant

## 2011-04-24 DIAGNOSIS — C4492 Squamous cell carcinoma of skin, unspecified: Secondary | ICD-10-CM

## 2011-04-24 HISTORY — DX: Squamous cell carcinoma of skin, unspecified: C44.92

## 2011-04-25 ENCOUNTER — Encounter (HOSPITAL_COMMUNITY): Payer: Medicare Other

## 2011-04-27 ENCOUNTER — Encounter (HOSPITAL_COMMUNITY): Payer: Medicare Other

## 2011-04-30 ENCOUNTER — Encounter (HOSPITAL_COMMUNITY): Payer: Medicare Other

## 2011-05-02 ENCOUNTER — Encounter (HOSPITAL_COMMUNITY): Payer: Medicare Other

## 2011-05-04 ENCOUNTER — Encounter (HOSPITAL_COMMUNITY): Payer: Medicare Other

## 2011-12-03 DIAGNOSIS — Z1231 Encounter for screening mammogram for malignant neoplasm of breast: Secondary | ICD-10-CM | POA: Diagnosis not present

## 2011-12-03 DIAGNOSIS — Z803 Family history of malignant neoplasm of breast: Secondary | ICD-10-CM | POA: Diagnosis not present

## 2012-02-20 DIAGNOSIS — I252 Old myocardial infarction: Secondary | ICD-10-CM | POA: Diagnosis not present

## 2012-02-20 DIAGNOSIS — I10 Essential (primary) hypertension: Secondary | ICD-10-CM | POA: Diagnosis not present

## 2012-02-20 DIAGNOSIS — E7800 Pure hypercholesterolemia, unspecified: Secondary | ICD-10-CM | POA: Diagnosis not present

## 2012-02-20 DIAGNOSIS — I2510 Atherosclerotic heart disease of native coronary artery without angina pectoris: Secondary | ICD-10-CM | POA: Diagnosis not present

## 2012-03-02 DIAGNOSIS — M545 Low back pain: Secondary | ICD-10-CM | POA: Diagnosis not present

## 2012-03-02 DIAGNOSIS — M533 Sacrococcygeal disorders, not elsewhere classified: Secondary | ICD-10-CM | POA: Diagnosis not present

## 2012-03-06 ENCOUNTER — Encounter: Payer: Self-pay | Admitting: Family Medicine

## 2012-03-24 DIAGNOSIS — I2510 Atherosclerotic heart disease of native coronary artery without angina pectoris: Secondary | ICD-10-CM | POA: Diagnosis not present

## 2012-03-24 DIAGNOSIS — I10 Essential (primary) hypertension: Secondary | ICD-10-CM | POA: Diagnosis not present

## 2012-03-24 DIAGNOSIS — E785 Hyperlipidemia, unspecified: Secondary | ICD-10-CM | POA: Diagnosis not present

## 2012-03-28 DIAGNOSIS — R5383 Other fatigue: Secondary | ICD-10-CM | POA: Diagnosis not present

## 2012-03-28 DIAGNOSIS — E782 Mixed hyperlipidemia: Secondary | ICD-10-CM | POA: Diagnosis not present

## 2012-03-28 DIAGNOSIS — Z79899 Other long term (current) drug therapy: Secondary | ICD-10-CM | POA: Diagnosis not present

## 2012-04-14 ENCOUNTER — Encounter: Payer: Self-pay | Admitting: Family Medicine

## 2012-04-14 ENCOUNTER — Ambulatory Visit (INDEPENDENT_AMBULATORY_CARE_PROVIDER_SITE_OTHER): Payer: Medicare Other | Admitting: Family Medicine

## 2012-04-14 ENCOUNTER — Encounter: Payer: Self-pay | Admitting: Gastroenterology

## 2012-04-14 VITALS — BP 116/70 | HR 64 | Temp 98.5°F | Ht 64.0 in | Wt 142.0 lb

## 2012-04-14 DIAGNOSIS — Z860100 Personal history of colon polyps, unspecified: Secondary | ICD-10-CM

## 2012-04-14 DIAGNOSIS — Z23 Encounter for immunization: Secondary | ICD-10-CM

## 2012-04-14 DIAGNOSIS — E785 Hyperlipidemia, unspecified: Secondary | ICD-10-CM | POA: Diagnosis not present

## 2012-04-14 DIAGNOSIS — I2510 Atherosclerotic heart disease of native coronary artery without angina pectoris: Secondary | ICD-10-CM | POA: Diagnosis not present

## 2012-04-14 DIAGNOSIS — K635 Polyp of colon: Secondary | ICD-10-CM | POA: Insufficient documentation

## 2012-04-14 DIAGNOSIS — Z9861 Coronary angioplasty status: Secondary | ICD-10-CM | POA: Insufficient documentation

## 2012-04-14 DIAGNOSIS — Z0000 Encounter for general adult medical examination without abnormal findings: Secondary | ICD-10-CM

## 2012-04-14 NOTE — Assessment & Plan Note (Signed)
Pt due for colonoscopy.

## 2012-04-14 NOTE — Addendum Note (Signed)
Addended by: Arnette Norris on: 04/14/2012 02:14 PM   Modules accepted: Orders

## 2012-04-14 NOTE — Patient Instructions (Addendum)
Preventive Care for Adults, Female A healthy lifestyle and preventive care can promote health and wellness. Preventive health guidelines for women include the following key practices.  A routine yearly physical is a good way to check with your caregiver about your health and preventive screening. It is a chance to share any concerns and updates on your health, and to receive a thorough exam.   Visit your dentist for a routine exam and preventive care every 6 months. Brush your teeth twice a day and floss once a day. Good oral hygiene prevents tooth decay and gum disease.   The frequency of eye exams is based on your age, health, family medical history, use of contact lenses, and other factors. Follow your caregiver's recommendations for frequency of eye exams.   Eat a healthy diet. Foods like vegetables, fruits, whole grains, low-fat dairy products, and lean protein foods contain the nutrients you need without too many calories. Decrease your intake of foods high in solid fats, added sugars, and salt. Eat the right amount of calories for you.Get information about a proper diet from your caregiver, if necessary.   Regular physical exercise is one of the most important things you can do for your health. Most adults should get at least 150 minutes of moderate-intensity exercise (any activity that increases your heart rate and causes you to sweat) each week. In addition, most adults need muscle-strengthening exercises on 2 or more days a week.   Maintain a healthy weight. The body mass index (BMI) is a screening tool to identify possible weight problems. It provides an estimate of body fat based on height and weight. Your caregiver can help determine your BMI, and can help you achieve or maintain a healthy weight.For adults 20 years and older:   A BMI below 18.5 is considered underweight.   A BMI of 18.5 to 24.9 is normal.   A BMI of 25 to 29.9 is considered overweight.   A BMI of 30 and above is  considered obese.   Maintain normal blood lipids and cholesterol levels by exercising and minimizing your intake of saturated fat. Eat a balanced diet with plenty of fruit and vegetables. Blood tests for lipids and cholesterol should begin at age 20 and be repeated every 5 years. If your lipid or cholesterol levels are high, you are over 50, or you are at high risk for heart disease, you may need your cholesterol levels checked more frequently.Ongoing high lipid and cholesterol levels should be treated with medicines if diet and exercise are not effective.   If you smoke, find out from your caregiver how to quit. If you do not use tobacco, do not start.   If you are pregnant, do not drink alcohol. If you are breastfeeding, be very cautious about drinking alcohol. If you are not pregnant and choose to drink alcohol, do not exceed 1 drink per day. One drink is considered to be 12 ounces (355 mL) of beer, 5 ounces (148 mL) of wine, or 1.5 ounces (44 mL) of liquor.   Avoid use of street drugs. Do not share needles with anyone. Ask for help if you need support or instructions about stopping the use of drugs.   High blood pressure causes heart disease and increases the risk of stroke. Your blood pressure should be checked at least every 1 to 2 years. Ongoing high blood pressure should be treated with medicines if weight loss and exercise are not effective.   If you are 55 to 76   years old, ask your caregiver if you should take aspirin to prevent strokes.   Diabetes screening involves taking a blood sample to check your fasting blood sugar level. This should be done once every 3 years, after age 45, if you are within normal weight and without risk factors for diabetes. Testing should be considered at a younger age or be carried out more frequently if you are overweight and have at least 1 risk factor for diabetes.   Breast cancer screening is essential preventive care for women. You should practice "breast  self-awareness." This means understanding the normal appearance and feel of your breasts and may include breast self-examination. Any changes detected, no matter how small, should be reported to a caregiver. Women in their 20s and 30s should have a clinical breast exam (CBE) by a caregiver as part of a regular health exam every 1 to 3 years. After age 40, women should have a CBE every year. Starting at age 40, women should consider having a mammography (breast X-ray test) every year. Women who have a family history of breast cancer should talk to their caregiver about genetic screening. Women at a high risk of breast cancer should talk to their caregivers about having magnetic resonance imaging (MRI) and a mammography every year.   The Pap test is a screening test for cervical cancer. A Pap test can show cell changes on the cervix that might become cervical cancer if left untreated. A Pap test is a procedure in which cells are obtained and examined from the lower end of the uterus (cervix).   Women should have a Pap test starting at age 21.   Between ages 21 and 29, Pap tests should be repeated every 2 years.   Beginning at age 30, you should have a Pap test every 3 years as long as the past 3 Pap tests have been normal.   Some women have medical problems that increase the chance of getting cervical cancer. Talk to your caregiver about these problems. It is especially important to talk to your caregiver if a new problem develops soon after your last Pap test. In these cases, your caregiver may recommend more frequent screening and Pap tests.   The above recommendations are the same for women who have or have not gotten the vaccine for human papillomavirus (HPV).   If you had a hysterectomy for a problem that was not cancer or a condition that could lead to cancer, then you no longer need Pap tests. Even if you no longer need a Pap test, a regular exam is a good idea to make sure no other problems are  starting.   If you are between ages 65 and 70, and you have had normal Pap tests going back 10 years, you no longer need Pap tests. Even if you no longer need a Pap test, a regular exam is a good idea to make sure no other problems are starting.   If you have had past treatment for cervical cancer or a condition that could lead to cancer, you need Pap tests and screening for cancer for at least 20 years after your treatment.   If Pap tests have been discontinued, risk factors (such as a new sexual partner) need to be reassessed to determine if screening should be resumed.   The HPV test is an additional test that may be used for cervical cancer screening. The HPV test looks for the virus that can cause the cell changes on the cervix.   The cells collected during the Pap test can be tested for HPV. The HPV test could be used to screen women aged 30 years and older, and should be used in women of any age who have unclear Pap test results. After the age of 30, women should have HPV testing at the same frequency as a Pap test.   Colorectal cancer can be detected and often prevented. Most routine colorectal cancer screening begins at the age of 50 and continues through age 75. However, your caregiver may recommend screening at an earlier age if you have risk factors for colon cancer. On a yearly basis, your caregiver may provide home test kits to check for hidden blood in the stool. Use of a small camera at the end of a tube, to directly examine the colon (sigmoidoscopy or colonoscopy), can detect the earliest forms of colorectal cancer. Talk to your caregiver about this at age 50, when routine screening begins. Direct examination of the colon should be repeated every 5 to 10 years through age 75, unless early forms of pre-cancerous polyps or small growths are found.   Hepatitis C blood testing is recommended for all people born from 1945 through 1965 and any individual with known risks for hepatitis C.    Practice safe sex. Use condoms and avoid high-risk sexual practices to reduce the spread of sexually transmitted infections (STIs). STIs include gonorrhea, chlamydia, syphilis, trichomonas, herpes, HPV, and human immunodeficiency virus (HIV). Herpes, HIV, and HPV are viral illnesses that have no cure. They can result in disability, cancer, and death. Sexually active women aged 25 and younger should be checked for chlamydia. Older women with new or multiple partners should also be tested for chlamydia. Testing for other STIs is recommended if you are sexually active and at increased risk.   Osteoporosis is a disease in which the bones lose minerals and strength with aging. This can result in serious bone fractures. The risk of osteoporosis can be identified using a bone density scan. Women ages 65 and over and women at risk for fractures or osteoporosis should discuss screening with their caregivers. Ask your caregiver whether you should take a calcium supplement or vitamin D to reduce the rate of osteoporosis.   Menopause can be associated with physical symptoms and risks. Hormone replacement therapy is available to decrease symptoms and risks. You should talk to your caregiver about whether hormone replacement therapy is right for you.   Use sunscreen with sun protection factor (SPF) of 30 or more. Apply sunscreen liberally and repeatedly throughout the day. You should seek shade when your shadow is shorter than you. Protect yourself by wearing long sleeves, pants, a wide-brimmed hat, and sunglasses year round, whenever you are outdoors.   Once a month, do a whole body skin exam, using a mirror to look at the skin on your back. Notify your caregiver of new moles, moles that have irregular borders, moles that are larger than a pencil eraser, or moles that have changed in shape or color.   Stay current with required immunizations.   Influenza. You need a dose every fall (or winter). The composition of  the flu vaccine changes each year, so being vaccinated once is not enough.   Pneumococcal polysaccharide. You need 1 to 2 doses if you smoke cigarettes or if you have certain chronic medical conditions. You need 1 dose at age 65 (or older) if you have never been vaccinated.   Tetanus, diphtheria, pertussis (Tdap, Td). Get 1 dose of   Tdap vaccine if you are younger than age 65, are over 65 and have contact with an infant, are a healthcare worker, are pregnant, or simply want to be protected from whooping cough. After that, you need a Td booster dose every 10 years. Consult your caregiver if you have not had at least 3 tetanus and diphtheria-containing shots sometime in your life or have a deep or dirty wound.   HPV. You need this vaccine if you are a woman age 26 or younger. The vaccine is given in 3 doses over 6 months.   Measles, mumps, rubella (MMR). You need at least 1 dose of MMR if you were born in 1957 or later. You may also need a second dose.   Meningococcal. If you are age 19 to 21 and a first-year college student living in a residence hall, or have one of several medical conditions, you need to get vaccinated against meningococcal disease. You may also need additional booster doses.   Zoster (shingles). If you are age 60 or older, you should get this vaccine.   Varicella (chickenpox). If you have never had chickenpox or you were vaccinated but received only 1 dose, talk to your caregiver to find out if you need this vaccine.   Hepatitis A. You need this vaccine if you have a specific risk factor for hepatitis A virus infection or you simply wish to be protected from this disease. The vaccine is usually given as 2 doses, 6 to 18 months apart.   Hepatitis B. You need this vaccine if you have a specific risk factor for hepatitis B virus infection or you simply wish to be protected from this disease. The vaccine is given in 3 doses, usually over 6 months.  Preventive Services /  Frequency Ages 19 to 39  Blood pressure check.** / Every 1 to 2 years.   Lipid and cholesterol check.** / Every 5 years beginning at age 20.   Clinical breast exam.** / Every 3 years for women in their 20s and 30s.   Pap test.** / Every 2 years from ages 21 through 29. Every 3 years starting at age 30 through age 65 or 70 with a history of 3 consecutive normal Pap tests.   HPV screening.** / Every 3 years from ages 30 through ages 65 to 70 with a history of 3 consecutive normal Pap tests.   Hepatitis C blood test.** / For any individual with known risks for hepatitis C.   Skin self-exam. / Monthly.   Influenza immunization.** / Every year.   Pneumococcal polysaccharide immunization.** / 1 to 2 doses if you smoke cigarettes or if you have certain chronic medical conditions.   Tetanus, diphtheria, pertussis (Tdap, Td) immunization. / A one-time dose of Tdap vaccine. After that, you need a Td booster dose every 10 years.   HPV immunization. / 3 doses over 6 months, if you are 26 and younger.   Measles, mumps, rubella (MMR) immunization. / You need at least 1 dose of MMR if you were born in 1957 or later. You may also need a second dose.   Meningococcal immunization. / 1 dose if you are age 19 to 21 and a first-year college student living in a residence hall, or have one of several medical conditions, you need to get vaccinated against meningococcal disease. You may also need additional booster doses.   Varicella immunization.** / Consult your caregiver.   Hepatitis A immunization.** / Consult your caregiver. 2 doses, 6 to 18 months   apart.   Hepatitis B immunization.** / Consult your caregiver. 3 doses usually over 6 months.  Ages 40 to 64  Blood pressure check.** / Every 1 to 2 years.   Lipid and cholesterol check.** / Every 5 years beginning at age 20.   Clinical breast exam.** / Every year after age 40.   Mammogram.** / Every year beginning at age 40 and continuing for as  long as you are in good health. Consult with your caregiver.   Pap test.** / Every 3 years starting at age 30 through age 65 or 70 with a history of 3 consecutive normal Pap tests.   HPV screening.** / Every 3 years from ages 30 through ages 65 to 70 with a history of 3 consecutive normal Pap tests.   Fecal occult blood test (FOBT) of stool. / Every year beginning at age 50 and continuing until age 75. You may not need to do this test if you get a colonoscopy every 10 years.   Flexible sigmoidoscopy or colonoscopy.** / Every 5 years for a flexible sigmoidoscopy or every 10 years for a colonoscopy beginning at age 50 and continuing until age 75.   Hepatitis C blood test.** / For all people born from 1945 through 1965 and any individual with known risks for hepatitis C.   Skin self-exam. / Monthly.   Influenza immunization.** / Every year.   Pneumococcal polysaccharide immunization.** / 1 to 2 doses if you smoke cigarettes or if you have certain chronic medical conditions.   Tetanus, diphtheria, pertussis (Tdap, Td) immunization.** / A one-time dose of Tdap vaccine. After that, you need a Td booster dose every 10 years.   Measles, mumps, rubella (MMR) immunization. / You need at least 1 dose of MMR if you were born in 1957 or later. You may also need a second dose.   Varicella immunization.** / Consult your caregiver.   Meningococcal immunization.** / Consult your caregiver.   Hepatitis A immunization.** / Consult your caregiver. 2 doses, 6 to 18 months apart.   Hepatitis B immunization.** / Consult your caregiver. 3 doses, usually over 6 months.  Ages 65 and over  Blood pressure check.** / Every 1 to 2 years.   Lipid and cholesterol check.** / Every 5 years beginning at age 20.   Clinical breast exam.** / Every year after age 40.   Mammogram.** / Every year beginning at age 40 and continuing for as long as you are in good health. Consult with your caregiver.   Pap test.** /  Every 3 years starting at age 30 through age 65 or 70 with a 3 consecutive normal Pap tests. Testing can be stopped between 65 and 70 with 3 consecutive normal Pap tests and no abnormal Pap or HPV tests in the past 10 years.   HPV screening.** / Every 3 years from ages 30 through ages 65 or 70 with a history of 3 consecutive normal Pap tests. Testing can be stopped between 65 and 70 with 3 consecutive normal Pap tests and no abnormal Pap or HPV tests in the past 10 years.   Fecal occult blood test (FOBT) of stool. / Every year beginning at age 50 and continuing until age 75. You may not need to do this test if you get a colonoscopy every 10 years.   Flexible sigmoidoscopy or colonoscopy.** / Every 5 years for a flexible sigmoidoscopy or every 10 years for a colonoscopy beginning at age 50 and continuing until age 75.   Hepatitis   C blood test.** / For all people born from 1945 through 1965 and any individual with known risks for hepatitis C.   Osteoporosis screening.** / A one-time screening for women ages 65 and over and women at risk for fractures or osteoporosis.   Skin self-exam. / Monthly.   Influenza immunization.** / Every year.   Pneumococcal polysaccharide immunization.** / 1 dose at age 65 (or older) if you have never been vaccinated.   Tetanus, diphtheria, pertussis (Tdap, Td) immunization. / A one-time dose of Tdap vaccine if you are over 65 and have contact with an infant, are a healthcare worker, or simply want to be protected from whooping cough. After that, you need a Td booster dose every 10 years.   Varicella immunization.** / Consult your caregiver.   Meningococcal immunization.** / Consult your caregiver.   Hepatitis A immunization.** / Consult your caregiver. 2 doses, 6 to 18 months apart.   Hepatitis B immunization.** / Check with your caregiver. 3 doses, usually over 6 months.  ** Family history and personal history of risk and conditions may change your caregiver's  recommendations. Document Released: 11/20/2001 Document Revised: 09/13/2011 Document Reviewed: 02/19/2011 [employer] Patient Information 2012 [employer]. 

## 2012-04-14 NOTE — Progress Notes (Signed)
Subjective:    Rachel Nichols is a 78 y.o. female who presents for Medicare Annual/Subsequent preventive examination.  Preventive Screening-Counseling & Management  Tobacco History  Smoking status  . Former Smoker -- 2.0 packs/day for 15 years  . Types: Cigarettes  . Quit date: 10/09/1975  Smokeless tobacco  . Never Used     Problems Prior to Visit 1.   Current Problems (verified) Patient Active Problem List  Diagnosis  . HYPERLIPIDEMIA  . GASTRITIS  . HIATAL HERNIA WITH REFLUX  . DIVERTICULOSIS, COLON  . BACK PAIN  . WEIGHT LOSS  . ABDOMINAL PAIN-EPIGASTRIC  . PUD, HX OF  . COLONIC POLYPS, ADENOMATOUS, HX OF  . CHOLECYSTECTOMY, HX OF    Medications Prior to Visit Current Outpatient Prescriptions on File Prior to Visit  Medication Sig Dispense Refill  . atenolol (TENORMIN) 25 MG tablet Take 12.5 mg by mouth daily.      . rosuvastatin (CRESTOR) 5 MG tablet Take 5 mg by mouth daily.      Marland Kitchen ZETIA 10 MG tablet Take 1 tablet by mouth daily.        Current Medications (verified) Current Outpatient Prescriptions  Medication Sig Dispense Refill  . aspirin 81 MG tablet Take 81 mg by mouth daily.      . ATELVIA 35 MG TBEC Take 1 tablet by mouth once a week.      Marland Kitchen atenolol (TENORMIN) 25 MG tablet Take 12.5 mg by mouth daily.      . Biotin 5 MG CAPS Take 1 capsule by mouth daily.      . cholecalciferol (VITAMIN D) 1000 UNITS tablet Take 2,000 Units by mouth daily.      . clopidogrel (PLAVIX) 75 MG tablet Take 1 tablet by mouth daily.      . Coenzyme Q10 (CO Q10) 100 MG CAPS Take 200 mg by mouth daily.      . fish oil-omega-3 fatty acids 1000 MG capsule Take 1 g by mouth 3 (three) times daily.      . minocycline (MINOCIN,DYNACIN) 100 MG capsule Take 1 capsule by mouth 2 (two) times a week.      . Multiple Vitamins-Minerals (CENTRUM SILVER ULTRA WOMENS) TABS Take 1 tablet by mouth daily.      . Multiple Vitamins-Minerals (PROTEGRA CARDIO PO) Take 1 tablet by mouth daily.        . rosuvastatin (CRESTOR) 5 MG tablet Take 5 mg by mouth daily.      Marland Kitchen ZETIA 10 MG tablet Take 1 tablet by mouth daily.         Allergies (verified) Diltiazem hcl and Tramadol hcl   PAST HISTORY  Family History Family History  Problem Relation Age of Onset  . Breast cancer Maternal Aunt   . Heart disease Brother 11    MI  . Heart disease Sister     stents  . Neuropathy Sister   . Hypertension Sister   . Stroke Mother   . Heart disease Father     Social History History  Substance Use Topics  . Smoking status: Former Smoker -- 2.0 packs/day for 15 years    Types: Cigarettes    Quit date: 10/09/1975  . Smokeless tobacco: Never Used  . Alcohol Use: No     Are there smokers in your home (other than you)? No  Risk Factors Current exercise habits: Gym/ health club routine includes swimming and walking on track .  Dietary issues discussed: na   Cardiac risk factors: advanced  age (older than 40 for men, 62 for women), dyslipidemia, family history of premature cardiovascular disease and hypertension.  Depression Screen (Note: if answer to either of the following is "Yes", a more complete depression screening is indicated)   Over the past two weeks, have you felt down, depressed or hopeless? No  Over the past two weeks, have you felt little interest or pleasure in doing things? No  Have you lost interest or pleasure in daily life? No  Do you often feel hopeless? No  Do you cry easily over simple problems? No  Activities of Daily Living In your present state of health, do you have any difficulty performing the following activities?:  Driving? No Managing money?  No Feeding yourself? No Getting from bed to chair? No Climbing a flight of stairs? No Preparing food and eating?: No Bathing or showering? No Getting dressed: No Getting to the toilet? No Using the toilet:No Moving around from place to place: No In the past year have you fallen or had a near fall?:No   Are  you sexually active?  Yes  Do you have more than one partner?  No  Hearing Difficulties: No Do you often ask people to speak up or repeat themselves? No Do you experience ringing or noises in your ears? No Do you have difficulty understanding soft or whispered voices? No   Do you feel that you have a problem with memory? No  Do you often misplace items? No  Do you feel safe at home?  Yes  Cognitive Testing  Alert? Yes  Normal Appearance?Yes  Oriented to person? Yes  Place? Yes   Time? Yes  Recall of three objects?  Yes  Can perform simple calculations? Yes  Displays appropriate judgment?Yes  Can read the correct time from a watch face?Yes   Advanced Directives have been discussed with the patient? Yes  List the Names of Other Physician/Practitioners you currently use: 1.  Cardiology--- Tresa Endo 2.l  GI -- Jarold Motto 3.  opth-- shapiro 4.  Dentist-- [place] 5 Gyn-- fogleman 6 Derm-- [place] Indicate any recent Medical Services you may have received from other than [facility] providers in the past year (date may be approximate).  Immunization History  Administered Date(s) Administered  . Influenza Whole 08/19/2008, 09/19/2009    Screening Tests Health Maintenance  Topic Date Due  . Tetanus/tdap  05/11/1952  . Colonoscopy  05/12/1983  . Pneumococcal Polysaccharide Vaccine Age 60 And Over  05/11/1998  . Influenza Vaccine  07/08/2012  . Zostavax  Completed    All answers were reviewed with the patient and necessary referrals were made:  Loreen Freud, DO   04/14/2012   History reviewed: allergies, current medications, past family history, past medical history, past social history, past surgical history and problem list  Review of Systems Review of Systems  Constitutional: Negative for activity change, appetite change and fatigue.  HENT: Negative for hearing loss, congestion, tinnitus and ear discharge.  dentist q59m Eyes: Negative for visual disturbance (see optho q1y -- vision  corrected to 20/20 with glasses).  Respiratory: Negative for cough, chest tightness and shortness of breath.   Cardiovascular: Negative for chest pain, palpitations and leg swelling.  Gastrointestinal: Negative for abdominal pain, diarrhea, constipation and abdominal distention.  Genitourinary: Negative for urgency, frequency, decreased urine volume and difficulty urinating.  Musculoskeletal: Negative for back pain, arthralgias and gait problem.  Skin: Negative for color change, pallor and rash.  Neurological: Negative for dizziness, light-headedness, numbness and headaches.  Hematological: Negative for  adenopathy. Does not bruise/bleed easily.  Psychiatric/Behavioral: Negative for suicidal ideas, confusion, sleep disturbance, self-injury, dysphoric mood, decreased concentration and agitation.        Objective:     Vision by Snellen chart: opth  Body mass index is 24.37 kg/(m^2). BP 116/70  Pulse 64  Temp 98.5 F (36.9 C) (Oral)  Ht 5\' 4"  (1.626 m)  Wt 142 lb (64.411 kg)  BMI 24.37 kg/m2  SpO2 96%  BP 116/70  Pulse 64  Temp 98.5 F (36.9 C) (Oral)  Ht 5\' 4"  (1.626 m)  Wt 142 lb (64.411 kg)  BMI 24.37 kg/m2  SpO2 96% General appearance: alert, cooperative, appears stated age and no distress Head: Normocephalic, without obvious abnormality, atraumatic Eyes: conjunctivae/corneas clear. PERRL, EOM&#39;s intact. Fundi benign. Ears: normal TM&#39;s and external ear canals both ears Nose: Nares normal. Septum midline. Mucosa normal. No drainage or sinus tenderness. Throat: lips, mucosa, and tongue normal; teeth and gums normal Neck: no adenopathy, no carotid bruit, no JVD, supple, symmetrical, trachea midline and thyroid not enlarged, symmetric, no tenderness/mass/nodules Back: symmetric, no curvature. ROM normal. No CVA tenderness. Lungs: clear to auscultation bilaterally Breasts: normal appearance, no masses or tenderness Heart: S1, S2 normal Abdomen: soft, non-tender;  bowel sounds normal; no masses,  no organomegaly Pelvic: deferred Extremities: extremities normal, atraumatic, no cyanosis or edema Pulses: 2+ and symmetric Skin: Skin color, texture, turgor normal. No rashes or lesions Lymph nodes: Cervical, supraclavicular, and axillary nodes normal. Neurologic: Alert and oriented X 3, normal strength and tone. Normal symmetric reflexes. Normal coordination and gait Psych-- no depression anxiety     Assessment:     cpe     Plan:    labs done by cardiologist ghm utd During the course of the visit the patient was educated and counseled about appropriate screening and preventive services including:    Pneumococcal vaccine   Influenza vaccine  Screening mammography  Bone densitometry screening  Colorectal cancer screening  Advanced directives: has an advanced directive - a copy HAS NOT been provided. Pt left without getting pneumonia vaccinex Diet review for nutrition referral? Yes ____  Not Indicated _x___   Patient Instructions (the written plan) was given to the patient.  Medicare Attestation I have personally reviewed: The patient's medical and social history Their use of alcohol, tobacco or illicit drugs Their current medications and supplements The patient's functional ability including ADLs,fall risks, home safety risks, cognitive, and hearing and visual impairment Diet and physical activities Evidence for depression or mood disorders  The patient's weight, height, BMI, and visual acuity have been recorded in the chart.  I have made referrals, counseling, and provided education to the patient based on review of the above and I have provided the patient with a written personalized care plan for preventive services.     Loreen Freud, DO   04/14/2012

## 2012-04-14 NOTE — Assessment & Plan Note (Signed)
Per cardiologist

## 2012-04-14 NOTE — Assessment & Plan Note (Signed)
Due for colon 

## 2012-04-14 NOTE — Assessment & Plan Note (Signed)
Hx MI Stress test recently done Per cardio

## 2012-04-16 ENCOUNTER — Encounter: Payer: Self-pay | Admitting: Gastroenterology

## 2012-04-29 ENCOUNTER — Encounter: Payer: Self-pay | Admitting: *Deleted

## 2012-05-01 ENCOUNTER — Ambulatory Visit (INDEPENDENT_AMBULATORY_CARE_PROVIDER_SITE_OTHER): Payer: Medicare Other | Admitting: Gastroenterology

## 2012-05-01 ENCOUNTER — Encounter: Payer: Self-pay | Admitting: Gastroenterology

## 2012-05-01 VITALS — BP 90/50 | HR 60 | Ht 64.0 in | Wt 143.0 lb

## 2012-05-01 DIAGNOSIS — Z7901 Long term (current) use of anticoagulants: Secondary | ICD-10-CM | POA: Diagnosis not present

## 2012-05-01 DIAGNOSIS — I2510 Atherosclerotic heart disease of native coronary artery without angina pectoris: Secondary | ICD-10-CM | POA: Diagnosis not present

## 2012-05-01 DIAGNOSIS — Z1211 Encounter for screening for malignant neoplasm of colon: Secondary | ICD-10-CM | POA: Diagnosis not present

## 2012-05-01 DIAGNOSIS — K5730 Diverticulosis of large intestine without perforation or abscess without bleeding: Secondary | ICD-10-CM

## 2012-05-01 NOTE — Progress Notes (Signed)
History of Present Illness:  This is a complex but very pleasant 78 year old Caucasian female with multiple medical problems including multiple cardiac stents and chronic anticoagulation. She currently denies any GI complaints. She had a negative colonoscopy 5 years ago except for diverticulosis. Chart review shows a small adenoma removed in 2004. Patient has regular bowel movements without melena, hematochezia, abdominal pain, any upper GI or hepatobiliary complaints. In addition to Plavix she is on aspirin 81 mg a day  I have reviewed this patient's present history, medical and surgical past history, allergies and medications.     ROS: The remainder of the 10 point ROS is negative     Physical Exam: Blood pressure 90/50, pulse 60 and regular, weight 143 pounds, and BMI 24.55. General well developed well nourished patient in no acute distress, appearing their stated age Eyes PERRLA, no icterus, fundoscopic exam per opthamologist Skin no lesions noted Neck supple, no adenopathy, no thyroid enlargement, no tenderness Chest clear to percussion and auscultation Heart no significant murmurs, gallops or rubs noted Abdomen no hepatosplenomegaly masses or tenderness, BS normal. . Extremities no acute joint lesions, edema, phlebitis or evidence of cellulitis.Marland Kitchen Psychological mental status normal and normal affect.  Assessment and plan: I do not think routine screening colonoscopy is indicated in this patient with multiple cardiac issues, chronic anticoagulation, and a previous negative colonoscopy except for diverticulosis 5 years ago. She is no family history of colon cancer or polyps, and is completely asymptomatic. She is been asked to return IFOB stool cards for occult blood. These are positive, we will go ahead with colonoscopy, otherwise I have advised her to continue her high fiber diet for diverticulosis as tolerated. She is a very sick husband, and does not wish exams at this time unless  absolutely needed. She is to continue her other medications as per primary care cardiology.  Encounter Diagnosis  Name Primary?  . Special screening for malignant neoplasms, colon Yes

## 2012-05-01 NOTE — Patient Instructions (Addendum)
Your physician has requested that you go to the basement for the following lab work before leaving today: Ifob. cc: Loreen Freud, DO

## 2012-05-09 ENCOUNTER — Other Ambulatory Visit: Payer: Medicare Other

## 2012-05-09 DIAGNOSIS — Z1211 Encounter for screening for malignant neoplasm of colon: Secondary | ICD-10-CM

## 2012-05-19 ENCOUNTER — Encounter: Payer: Medicare Other | Admitting: Gastroenterology

## 2012-06-27 ENCOUNTER — Encounter: Payer: Self-pay | Admitting: Gastroenterology

## 2012-08-20 DIAGNOSIS — L820 Inflamed seborrheic keratosis: Secondary | ICD-10-CM | POA: Diagnosis not present

## 2012-08-20 DIAGNOSIS — D239 Other benign neoplasm of skin, unspecified: Secondary | ICD-10-CM | POA: Diagnosis not present

## 2012-08-20 DIAGNOSIS — D235 Other benign neoplasm of skin of trunk: Secondary | ICD-10-CM | POA: Diagnosis not present

## 2012-08-25 ENCOUNTER — Ambulatory Visit (INDEPENDENT_AMBULATORY_CARE_PROVIDER_SITE_OTHER): Payer: Medicare Other

## 2012-08-25 DIAGNOSIS — Z23 Encounter for immunization: Secondary | ICD-10-CM | POA: Diagnosis not present

## 2012-09-01 DIAGNOSIS — Z961 Presence of intraocular lens: Secondary | ICD-10-CM | POA: Diagnosis not present

## 2012-09-22 DIAGNOSIS — I2510 Atherosclerotic heart disease of native coronary artery without angina pectoris: Secondary | ICD-10-CM | POA: Diagnosis not present

## 2012-09-22 DIAGNOSIS — E785 Hyperlipidemia, unspecified: Secondary | ICD-10-CM | POA: Diagnosis not present

## 2012-11-07 DIAGNOSIS — I2510 Atherosclerotic heart disease of native coronary artery without angina pectoris: Secondary | ICD-10-CM | POA: Diagnosis not present

## 2012-11-07 DIAGNOSIS — R5381 Other malaise: Secondary | ICD-10-CM | POA: Diagnosis not present

## 2012-11-07 DIAGNOSIS — R5383 Other fatigue: Secondary | ICD-10-CM | POA: Diagnosis not present

## 2012-11-07 DIAGNOSIS — E785 Hyperlipidemia, unspecified: Secondary | ICD-10-CM | POA: Diagnosis not present

## 2012-12-21 DIAGNOSIS — M949 Disorder of cartilage, unspecified: Secondary | ICD-10-CM | POA: Diagnosis not present

## 2012-12-21 DIAGNOSIS — Z01419 Encounter for gynecological examination (general) (routine) without abnormal findings: Secondary | ICD-10-CM | POA: Diagnosis not present

## 2012-12-21 DIAGNOSIS — M899 Disorder of bone, unspecified: Secondary | ICD-10-CM | POA: Diagnosis not present

## 2012-12-21 DIAGNOSIS — E559 Vitamin D deficiency, unspecified: Secondary | ICD-10-CM | POA: Diagnosis not present

## 2012-12-21 DIAGNOSIS — N952 Postmenopausal atrophic vaginitis: Secondary | ICD-10-CM | POA: Diagnosis not present

## 2012-12-21 DIAGNOSIS — Z124 Encounter for screening for malignant neoplasm of cervix: Secondary | ICD-10-CM | POA: Diagnosis not present

## 2012-12-22 DIAGNOSIS — E559 Vitamin D deficiency, unspecified: Secondary | ICD-10-CM | POA: Diagnosis not present

## 2012-12-29 DIAGNOSIS — M949 Disorder of cartilage, unspecified: Secondary | ICD-10-CM | POA: Diagnosis not present

## 2012-12-29 DIAGNOSIS — Z1231 Encounter for screening mammogram for malignant neoplasm of breast: Secondary | ICD-10-CM | POA: Diagnosis not present

## 2012-12-29 DIAGNOSIS — M899 Disorder of bone, unspecified: Secondary | ICD-10-CM | POA: Diagnosis not present

## 2013-02-25 ENCOUNTER — Telehealth: Payer: Self-pay | Admitting: Cardiovascular Disease

## 2013-02-25 ENCOUNTER — Encounter: Payer: Self-pay | Admitting: Family Medicine

## 2013-02-25 NOTE — Telephone Encounter (Signed)
Left msg for pt to call me to set up her appt

## 2013-03-05 ENCOUNTER — Encounter: Payer: Self-pay | Admitting: *Deleted

## 2013-03-06 ENCOUNTER — Encounter: Payer: Self-pay | Admitting: Cardiovascular Disease

## 2013-03-06 ENCOUNTER — Ambulatory Visit (INDEPENDENT_AMBULATORY_CARE_PROVIDER_SITE_OTHER): Payer: Medicare Other | Admitting: Cardiovascular Disease

## 2013-03-06 VITALS — BP 110/64 | HR 56 | Ht 64.0 in | Wt 141.6 lb

## 2013-03-06 DIAGNOSIS — I10 Essential (primary) hypertension: Secondary | ICD-10-CM

## 2013-03-06 DIAGNOSIS — I2510 Atherosclerotic heart disease of native coronary artery without angina pectoris: Secondary | ICD-10-CM

## 2013-03-06 DIAGNOSIS — E785 Hyperlipidemia, unspecified: Secondary | ICD-10-CM

## 2013-03-06 NOTE — Patient Instructions (Signed)
Your physician has recommended you make the following change in your medication: Increase your COQ 10 to 300 mg.  Your physician recommends that you schedule a follow-up appointment in: 6 months

## 2013-03-06 NOTE — Progress Notes (Signed)
Patient ID: Rachel Nichols, female   DOB: 12-09-1930, 79 y.o.   MRN: [id number]    HPI: Rachel Nichols, is a 79 y.o. female who presents to the office for a cardiology evaluation. I last saw her in December 2013.  Rachel Nichols is now almost 79 years old. She suffered a myocardial infarction in January 1993 and underwent PTCA of her right coronary artery. In April 2012, a 2.5x12 mm resolute DES stent was inserted to the proximal portion of a ramus intermediate vessel. She did have 60% LAD stenosis after diagonal vessel as well as 40-50% mid right coronary artery stenosis for which she's been treated medically. Her last myocardial perfusion study was in May 2013 which remained normal. Over the past 6 months, she has continued to remain stable on her current medical regimen. We had increased her Crestor to 10 mg to take in addition to this area there LDL particle concentration was still elevated at 1387. Subsequent blood work done on 11/10/2012 did show marked improvement with her LDL now at 61 and LDL particle number at 1027 the total cholesterol was 126, HDL 43, triglycerides 108. Her insulin resistance score was still slightly elevated at 56. Over the past 6 months, she continues to be stable. She swims several days per week. She does walk. She denies chest pain or shortness of breath.  Past Medical History  Diagnosis Date  . Hyperlipidemia   . Basal cell carcinoma     Nose  . Heart attack 1993    Cardiolyte stress---  stents 2012  . Diverticulosis 2002,2004,2007    Colonoscopy  . Benign neoplasm of colon 2004    Colonoscopy  . PUD (peptic ulcer disease)   . GERD (gastroesophageal reflux disease)   . Hiatal hernia   . Gastritis   . Duodenitis   . History of blood transfusion     pt denies  . CAD (coronary artery disease)     echo 09/13/10- EF>55%, aortic valve is mildly sclerotic; myoview 02-20-12-no ischemia  . Fibrocystic breast disease   . Adenomatous colon polyp 2002    Colonoscopy   .  Carotid bruit     carotid dopplers 05/16/04- normal    Past Surgical History  Procedure Laterality Date  . Basal cell carcinoma excision      From the nose  . Cataract extraction  10/26/09    Right eye  . Cataract extraction  11/09/09    Left eye  . Finger surgery  04/2011    Cyst removal, right hand forefinger  . Coronary stent placement  01/10/11    hig grade ramus branch stenosis with mod LAD dz, PCI/stenting of ramus branch with a Resolute DES   . Cholecystectomy  1984  . Angioplasty  1993  . Dilation and curettage of uterus    . Left oophorectomy  1957    Allergies  Allergen Reactions  . Diltiazem Hcl   . Tramadol Hcl     Current Outpatient Prescriptions  Medication Sig Dispense Refill  . aspirin 325 MG tablet Take 325 mg by mouth daily.      . ATELVIA 35 MG TBEC Take 1 tablet by mouth once a week.      Marland Kitchen atenolol (TENORMIN) 25 MG tablet Take 12.5 mg by mouth daily.      . Biotin 1000 MCG tablet Take 1,000 mcg by mouth daily. 2      . cholecalciferol (VITAMIN D) 1000 UNITS tablet Take 2,000 Units by mouth  daily.      . Coenzyme Q10 (CO Q10) 100 MG CAPS Take 200 mg by mouth daily.      . Cyanocobalamin (VITAMIN B 12 PO) Take 500 mg by mouth daily.      . fish oil-omega-3 fatty acids 1000 MG capsule Take 1 g by mouth 3 (three) times daily.      . minocycline (MINOCIN,DYNACIN) 100 MG capsule Take 1 capsule by mouth 2 (two) times a week.      . Multiple Vitamins-Minerals (CENTRUM SILVER ULTRA WOMENS) TABS Take 1 tablet by mouth daily.      . rosuvastatin (CRESTOR) 10 MG tablet Take 10 mg by mouth daily.      Marland Kitchen ZETIA 10 MG tablet Take 1 tablet by mouth daily.      . Multiple Vitamins-Minerals (PROTEGRA CARDIO PO) Take 1 tablet by mouth daily.       No current facility-administered medications for this visit.   Socially she is married. Has one child who is living and one child who is deceased. She has 3 grandchildren. There is no tobacco or alcohol use.  ROS is negative for  fever chills night sweats. She denies fatigue. She feels that she is sleeping well. She denies wheezing. She denies significant shortness of breath. She denies angina symptoms. He denies palpitations. There are no episodes of presyncope or syncope. She denies abdominal pain. She denies bleeding. She denies melena or hematochezia. She denies claudication. She denies paresthesias. She does admit to some episodes of mild aches  PE BP 110/64  Pulse 56  Ht 5\' 4"  (1.626 m)  Wt 141 lb 9.6 oz (64.229 kg)  BMI 24.29 kg/m2  General: Alert, oriented, no distress.  HEENT: Normocephalic, atraumatic. Pupils round and reactive; sclera anicteric;  Nose without nasal septal hypertrophy Mouth/Parynx benign; Mallinpatti scale 2 Neck: No JVD, no carotid briuts Lungs: clear to ausculatation and percussion; no wheezing or rales Heart: RRR, s1 s2 normal  Abdomen: soft, nontender; no hepatosplenomehaly, BS+; abdominal aorta nontender and not dilated by palpation. Pulses 2+ Extremities: no clubbinbg cyanosis or edema, Homan's sign negative  Neurologic: grossly nonfocal  ECG: Sinus rhythm at 56 beats per minute. PR interval 170 ms QTc interval 441 ms.  LABS:  BMET    Component Value Date/Time   NA 138 01/11/2011 0605   K 4.0 01/11/2011 0605   CL 110 01/11/2011 0605   CO2 24 01/11/2011 0605   GLUCOSE 102* 01/11/2011 0605   BUN 7 01/11/2011 0605   CREATININE 0.84 01/11/2011 0605   CALCIUM 8.1* 01/11/2011 0605   GFRNONAA >60 01/11/2011 0605   GFRAA  Value: >60        The eGFR has been calculated using the MDRD equation. This calculation has not been validated in all clinical situations. eGFR's persistently <60 mL/min signify possible Chronic Kidney Disease. 01/11/2011 0605     Hepatic Function Panel     Component Value Date/Time   PROT 6.5 01/10/2011 0832   ALBUMIN 3.7 01/10/2011 0832   AST 25 01/10/2011 0832   ALT 19 01/10/2011 0832   ALKPHOS 47 01/10/2011 0832   BILITOT 0.6 01/10/2011 0832   BILIDIR 0.1 09/19/2009 1545      CBC    Component Value Date/Time   WBC 7.4 01/11/2011 0605   RBC 3.88 01/11/2011 0605   HGB 12.2 01/11/2011 0605   HCT 36.8 01/11/2011 0605   PLT 146* 01/11/2011 0605   MCV 94.8 01/11/2011 0605   MCH 31.4 01/11/2011 1610  MCHC 33.2 01/11/2011 0605   RDW 13.2 01/11/2011 0605   LYMPHSABS 1.5 01/10/2011 0832   MONOABS 0.8 01/10/2011 0832   EOSABS 0.1 01/10/2011 0832   BASOSABS 0.0 01/10/2011 0832     BNP    Component Value Date/Time   PROBNP 86.0 01/10/2011 1357    Lipid Panel     Component Value Date/Time   CHOL  Value: 105        ATP III CLASSIFICATION:  <200     mg/dL   Desirable  161-096  mg/dL   Borderline High  >=045    mg/dL   High        4/0/9811 0605   TRIG 119 01/11/2011 0605   HDL 32* 01/11/2011 0605   CHOLHDL 3.3 01/11/2011 0605   VLDL 24 01/11/2011 0605   LDLCALC  Value: 49        Total Cholesterol/HDL:CHD Risk Coronary Heart Disease Risk Table                     Men   Women  1/2 Average Risk   3.4   3.3  Average Risk       5.0   4.4  2 X Average Risk   9.6   7.1  3 X Average Risk  23.4   11.0        Use the calculated Patient Ratio above and the CHD Risk Table to determine the patient's CHD Risk.        ATP III CLASSIFICATION (LDL):  <100     mg/dL   Optimal  914-782  mg/dL   Near or Above                    Optimal  130-159  mg/dL   Borderline  956-213  mg/dL   High  >086     mg/dL   Very High 02/12/8468 6295     RADIOLOGY: No results found.    ASSESSMENT AND PLAN: The cardiac standpoint, Ms. Foutz is 23 years following her myocardial infarction and PTCA of her right coronary artery. She is 2 years status post stenting of an 80% stenosis in the proximal portion of a ramus intermediate vessel treated with a resolute DES stent. She now is on aspirin alone without Plavix. Her most recent MMR lipoprotein is significantly improved on the 10 mg Crestor. He continues to take 70. Has some concerns about cost. We did discuss perhaps that this can be switched in the future to generic atorvastatin  she would prefer to still stay on this presently. However, she has noticed some mild aches diffusely. I suggested she increase her coenzyme Q10 to 300 mg. I can I encouraged her to continue her exercise and see in duration. Her blood pressure is well-controlled I did check orthostatic blood pressure readings today which were normal. I will see her in 6 months for cardiology reevaluation.     Lennette Bihari, MD, [facility]  03/06/2013 4:31 PM

## 2013-06-16 DIAGNOSIS — S7000XA Contusion of unspecified hip, initial encounter: Secondary | ICD-10-CM | POA: Diagnosis not present

## 2013-06-16 DIAGNOSIS — M25559 Pain in unspecified hip: Secondary | ICD-10-CM | POA: Diagnosis not present

## 2013-07-07 DIAGNOSIS — M25559 Pain in unspecified hip: Secondary | ICD-10-CM | POA: Diagnosis not present

## 2013-07-07 DIAGNOSIS — M25569 Pain in unspecified knee: Secondary | ICD-10-CM | POA: Diagnosis not present

## 2013-08-03 ENCOUNTER — Encounter: Payer: Self-pay | Admitting: Podiatry

## 2013-08-03 ENCOUNTER — Ambulatory Visit (INDEPENDENT_AMBULATORY_CARE_PROVIDER_SITE_OTHER): Payer: Medicare Other | Admitting: Podiatry

## 2013-08-03 ENCOUNTER — Ambulatory Visit (INDEPENDENT_AMBULATORY_CARE_PROVIDER_SITE_OTHER): Payer: Medicare Other

## 2013-08-03 VITALS — BP 99/59 | HR 52 | Resp 20 | Ht 64.0 in | Wt 142.0 lb

## 2013-08-03 DIAGNOSIS — M2040 Other hammer toe(s) (acquired), unspecified foot: Secondary | ICD-10-CM | POA: Diagnosis not present

## 2013-08-03 DIAGNOSIS — Z23 Encounter for immunization: Secondary | ICD-10-CM

## 2013-08-03 NOTE — Progress Notes (Signed)
  Subjective:    Patient ID: Rachel Nichols, female    DOB: 1931-10-01, 79 y.o.   MRN: [id number]: "I've got a place on the ball of my right foot that bothers me."  Foot Pain This is a recurrent problem. The current episode started more than 1 year ago. The problem occurs intermittently. The problem has been gradually worsening. The symptoms are aggravated by walking and standing. She has tried rest for the symptoms. The treatment provided no relief.      Review of Systems  Constitutional: Negative.   HENT: Negative.   Eyes: Negative.   Respiratory: Positive for shortness of breath.   Cardiovascular: Negative.   Gastrointestinal: Negative.   Endocrine: Negative.   Genitourinary: Positive for urgency.  Musculoskeletal: Negative.   Skin: Negative.   Allergic/Immunologic: Negative.   Neurological: Negative.   Hematological: Bruises/bleeds easily.  Psychiatric/Behavioral: Negative.        Objective:   Physical Exam  Psychiatric orientated x3 white female presents today  Vascular: The DP and PT pulses are two over four bilaterally. Capillary fill is immediate bilaterally.  Neurological: Knee and ankle reflexes equal and reactive bilaterally. Sensation to 10 g monofilament wire intact 10 over 10 locations bilaterally.  Dermatological: Atrophic skin noted bilaterally. Atrophic fat pad plantar MPJ noted bilaterally. Evidence of slight residual callus noted second MPJ right foot.  Musculoskeletal: Stable gait pattern noted. Hammertoes second digits noted bilaterally.        Assessment & Plan:    Assessment: Hammer second toes noted bilaterally. Atrophy of plantar fat pad MPJ bilaterally. Residual callus plantar right foot  Plan: Patient advised to wear sturdy little heel lace up style shoes. In addition I recommended a pumice stone for use on the plantar callus right an as-needed basis.  Reappoint at patient's request.  Richard C.Leeanne Deed, DPM

## 2013-08-03 NOTE — Patient Instructions (Signed)
Wear soft comfortable lace up style shoes low heels. Use a pumice stone as needed for the callus on the right foot.

## 2013-08-05 ENCOUNTER — Other Ambulatory Visit: Payer: Self-pay | Admitting: *Deleted

## 2013-08-05 MED ORDER — EZETIMIBE 10 MG PO TABS: 10.0000 mg | ORAL_TABLET | Freq: Every day | ORAL | Status: DC

## 2013-08-11 ENCOUNTER — Telehealth: Payer: Self-pay

## 2013-08-11 DIAGNOSIS — L820 Inflamed seborrheic keratosis: Secondary | ICD-10-CM | POA: Diagnosis not present

## 2013-08-11 DIAGNOSIS — D239 Other benign neoplasm of skin, unspecified: Secondary | ICD-10-CM | POA: Diagnosis not present

## 2013-08-11 NOTE — Telephone Encounter (Signed)
No answer no vm  Tdap ? [facility]-screening from Dr Valarie Cones no further due to cardiac issues--FOB--neg MMG--12/2012--neg Dexa--12/2012 Flu vaccine--07/2013 Zostavax--04/2008 Pneumonia--04/2012

## 2013-08-12 NOTE — Telephone Encounter (Signed)
Left message for call back. Identifiable.   

## 2013-08-13 ENCOUNTER — Ambulatory Visit (INDEPENDENT_AMBULATORY_CARE_PROVIDER_SITE_OTHER): Payer: Medicare Other | Admitting: Family Medicine

## 2013-08-13 ENCOUNTER — Encounter: Payer: Self-pay | Admitting: Family Medicine

## 2013-08-13 VITALS — BP 100/58 | HR 54 | Temp 98.6°F | Ht 64.0 in | Wt 141.6 lb

## 2013-08-13 DIAGNOSIS — E785 Hyperlipidemia, unspecified: Secondary | ICD-10-CM | POA: Diagnosis not present

## 2013-08-13 DIAGNOSIS — I10 Essential (primary) hypertension: Secondary | ICD-10-CM | POA: Diagnosis not present

## 2013-08-13 DIAGNOSIS — I2510 Atherosclerotic heart disease of native coronary artery without angina pectoris: Secondary | ICD-10-CM

## 2013-08-13 DIAGNOSIS — Z0000 Encounter for general adult medical examination without abnormal findings: Secondary | ICD-10-CM

## 2013-08-13 DIAGNOSIS — R7309 Other abnormal glucose: Secondary | ICD-10-CM

## 2013-08-13 DIAGNOSIS — R739 Hyperglycemia, unspecified: Secondary | ICD-10-CM

## 2013-08-13 LAB — HEPATIC FUNCTION PANEL
ALT: 24 U/L (ref 0–35)
Albumin: 4.3 g/dL (ref 3.5–5.2)
Total Protein: 7.5 g/dL (ref 6.0–8.3)

## 2013-08-13 LAB — POCT URINALYSIS DIPSTICK
Blood, UA: NEGATIVE
Leukocytes, UA: NEGATIVE
Nitrite, UA: NEGATIVE
Protein, UA: NEGATIVE
Urobilinogen, UA: 0.2
pH, UA: 7

## 2013-08-13 LAB — BASIC METABOLIC PANEL
BUN: 14 mg/dL (ref 6–23)
CO2: 30 mEq/L (ref 19–32)
Chloride: 104 mEq/L (ref 96–112)
GFR: 60.86 mL/min (ref >60.00)
Potassium: 4.2 mEq/L (ref 3.5–5.1)

## 2013-08-13 LAB — CBC WITH DIFFERENTIAL/PLATELET
Basophils Relative: 0.5 % (ref 0.0–3.0)
Eosinophils Absolute: 0.1 10*3/uL (ref 0.0–0.7)
Eosinophils Relative: 1.3 % (ref 0.0–5.0)
HCT: 42 % (ref 36.0–46.0)
Lymphs Abs: 1.8 10*3/uL (ref 0.7–4.0)
MCHC: 33.8 g/dL (ref 30.0–36.0)
MCV: 94.3 fl (ref 78.0–100.0)
Monocytes Absolute: 0.6 10*3/uL (ref 0.1–1.0)
Platelets: 192 10*3/uL (ref 150.0–400.0)
RBC: 4.45 Mil/uL (ref 3.87–5.11)
WBC: 5.3 10*3/uL (ref 4.5–10.5)

## 2013-08-13 LAB — LIPID PANEL
HDL: 46.4 mg/dL (ref >39.00)
LDL Cholesterol: 65 mg/dL (ref 0–99)
Total CHOL/HDL Ratio: 3
VLDL: 28.4 mg/dL (ref 0.0–40.0)

## 2013-08-13 LAB — HEMOGLOBIN A1C: Hgb A1c MFr Bld: 6.5 % (ref 4.6–6.5)

## 2013-08-13 NOTE — Assessment & Plan Note (Signed)
Stable Cont meds 

## 2013-08-13 NOTE — Assessment & Plan Note (Signed)
Check labs 

## 2013-08-13 NOTE — Patient Instructions (Signed)
Preventive Care for Adults, Female A healthy lifestyle and preventive care can promote health and wellness. Preventive health guidelines for women include the following key practices.  A routine yearly physical is a good way to check with your caregiver about your health and preventive screening. It is a chance to share any concerns and updates on your health, and to receive a thorough exam.  Visit your dentist for a routine exam and preventive care every 6 months. Brush your teeth twice a day and floss once a day. Good oral hygiene prevents tooth decay and gum disease.  The frequency of eye exams is based on your age, health, family medical history, use of contact lenses, and other factors. Follow your caregiver's recommendations for frequency of eye exams.  Eat a healthy diet. Foods like vegetables, fruits, whole grains, low-fat dairy products, and lean protein foods contain the nutrients you need without too many calories. Decrease your intake of foods high in solid fats, added sugars, and salt. Eat the right amount of calories for you.Get information about a proper diet from your caregiver, if necessary.  Regular physical exercise is one of the most important things you can do for your health. Most adults should get at least 150 minutes of moderate-intensity exercise (any activity that increases your heart rate and causes you to sweat) each week. In addition, most adults need muscle-strengthening exercises on 2 or more days a week.  Maintain a healthy weight. The body mass index (BMI) is a screening tool to identify possible weight problems. It provides an estimate of body fat based on height and weight. Your caregiver can help determine your BMI, and can help you achieve or maintain a healthy weight.For adults 20 years and older:  A BMI below 18.5 is considered underweight.  A BMI of 18.5 to 24.9 is normal.  A BMI of 25 to 29.9 is considered overweight.  A BMI of 30 and above is  considered obese.  Maintain normal blood lipids and cholesterol levels by exercising and minimizing your intake of saturated fat. Eat a balanced diet with plenty of fruit and vegetables. Blood tests for lipids and cholesterol should begin at age 20 and be repeated every 5 years. If your lipid or cholesterol levels are high, you are over 50, or you are at high risk for heart disease, you may need your cholesterol levels checked more frequently.Ongoing high lipid and cholesterol levels should be treated with medicines if diet and exercise are not effective.  If you smoke, find out from your caregiver how to quit. If you do not use tobacco, do not start.  Lung cancer screening is recommended for adults aged 55 80 years who are at high risk for developing lung cancer because of a history of smoking. Yearly low-dose computed tomography (CT) is recommended for people who have at least a 30-pack-year history of smoking and are a current smoker or have quit within the past 15 years. A pack year of smoking is smoking an average of 1 pack of cigarettes a day for 1 year (for example: 1 pack a day for 30 years or 2 packs a day for 15 years). Yearly screening should continue until the smoker has stopped smoking for at least 15 years. Yearly screening should also be stopped for people who develop a health problem that would prevent them from having lung cancer treatment.  If you are pregnant, do not drink alcohol. If you are breastfeeding, be very cautious about drinking alcohol. If you are   not pregnant and choose to drink alcohol, do not exceed 1 drink per day. One drink is considered to be 12 ounces (355 mL) of beer, 5 ounces (148 mL) of wine, or 1.5 ounces (44 mL) of liquor.  Avoid use of street drugs. Do not share needles with anyone. Ask for help if you need support or instructions about stopping the use of drugs.  High blood pressure causes heart disease and increases the risk of stroke. Your blood pressure  should be checked at least every 1 to 2 years. Ongoing high blood pressure should be treated with medicines if weight loss and exercise are not effective.  If you are 55 to 77 years old, ask your caregiver if you should take aspirin to prevent strokes.  Diabetes screening involves taking a blood sample to check your fasting blood sugar level. This should be done once every 3 years, after age 45, if you are within normal weight and without risk factors for diabetes. Testing should be considered at a younger age or be carried out more frequently if you are overweight and have at least 1 risk factor for diabetes.  Breast cancer screening is essential preventive care for women. You should practice "breast self-awareness." This means understanding the normal appearance and feel of your breasts and may include breast self-examination. Any changes detected, no matter how small, should be reported to a caregiver. Women in their 20s and 30s should have a clinical breast exam (CBE) by a caregiver as part of a regular health exam every 1 to 3 years. After age 40, women should have a CBE every year. Starting at age 40, women should consider having a mammography (breast X-ray test) every year. Women who have a family history of breast cancer should talk to their caregiver about genetic screening. Women at a high risk of breast cancer should talk to their caregivers about having magnetic resonance imaging (MRI) and a mammography every year.  Breast cancer gene (BRCA)-related cancer risk assessment is recommended for women who have family members with BRCA-related cancers. BRCA-related cancers include breast, ovarian, tubal, and peritoneal cancers. Having family members with these cancers may be associated with an increased risk for harmful changes (mutations) in the breast cancer genes BRCA1 and BRCA2. Results of the assessment will determine the need for genetic counseling and BRCA1 and BRCA2 testing.  The Pap test is  a screening test for cervical cancer. A Pap test can show cell changes on the cervix that might become cervical cancer if left untreated. A Pap test is a procedure in which cells are obtained and examined from the lower end of the uterus (cervix).  Women should have a Pap test starting at age 21.  Between ages 21 and 29, Pap tests should be repeated every 2 years.  Beginning at age 30, you should have a Pap test every 3 years as long as the past 3 Pap tests have been normal.  Some women have medical problems that increase the chance of getting cervical cancer. Talk to your caregiver about these problems. It is especially important to talk to your caregiver if a new problem develops soon after your last Pap test. In these cases, your caregiver may recommend more frequent screening and Pap tests.  The above recommendations are the same for women who have or have not gotten the vaccine for human papillomavirus (HPV).  If you had a hysterectomy for a problem that was not cancer or a condition that could lead to cancer, then   you no longer need Pap tests. Even if you no longer need a Pap test, a regular exam is a good idea to make sure no other problems are starting.  If you are between ages 65 and 70, and you have had normal Pap tests going back 10 years, you no longer need Pap tests. Even if you no longer need a Pap test, a regular exam is a good idea to make sure no other problems are starting.  If you have had past treatment for cervical cancer or a condition that could lead to cancer, you need Pap tests and screening for cancer for at least 20 years after your treatment.  If Pap tests have been discontinued, risk factors (such as a new sexual partner) need to be reassessed to determine if screening should be resumed.  The HPV test is an additional test that may be used for cervical cancer screening. The HPV test looks for the virus that can cause the cell changes on the cervix. The cells collected  during the Pap test can be tested for HPV. The HPV test could be used to screen women aged 30 years and older, and should be used in women of any age who have unclear Pap test results. After the age of 30, women should have HPV testing at the same frequency as a Pap test.  Colorectal cancer can be detected and often prevented. Most routine colorectal cancer screening begins at the age of 50 and continues through age 75. However, your caregiver may recommend screening at an earlier age if you have risk factors for colon cancer. On a yearly basis, your caregiver may provide home test kits to check for hidden blood in the stool. Use of a small camera at the end of a tube, to directly examine the colon (sigmoidoscopy or colonoscopy), can detect the earliest forms of colorectal cancer. Talk to your caregiver about this at age 50, when routine screening begins. Direct examination of the colon should be repeated every 5 to 10 years through age 75, unless early forms of pre-cancerous polyps or small growths are found.  Hepatitis C blood testing is recommended for all people born from 1945 through 1965 and any individual with known risks for hepatitis C.  Practice safe sex. Use condoms and avoid high-risk sexual practices to reduce the spread of sexually transmitted infections (STIs). STIs include gonorrhea, chlamydia, syphilis, trichomonas, herpes, HPV, and human immunodeficiency virus (HIV). Herpes, HIV, and HPV are viral illnesses that have no cure. They can result in disability, cancer, and death. Sexually active women aged 25 and younger should be checked for chlamydia. Older women with new or multiple partners should also be tested for chlamydia. Testing for other STIs is recommended if you are sexually active and at increased risk.  Osteoporosis is a disease in which the bones lose minerals and strength with aging. This can result in serious bone fractures. The risk of osteoporosis can be identified using a  bone density scan. Women ages 65 and over and women at risk for fractures or osteoporosis should discuss screening with their caregivers. Ask your caregiver whether you should take a calcium supplement or vitamin D to reduce the rate of osteoporosis.  Menopause can be associated with physical symptoms and risks. Hormone replacement therapy is available to decrease symptoms and risks. You should talk to your caregiver about whether hormone replacement therapy is right for you.  Use sunscreen. Apply sunscreen liberally and repeatedly throughout the day. You should seek shade   when your shadow is shorter than you. Protect yourself by wearing long sleeves, pants, a wide-brimmed hat, and sunglasses year round, whenever you are outdoors.  Once a month, do a whole body skin exam, using a mirror to look at the skin on your back. Notify your caregiver of new moles, moles that have irregular borders, moles that are larger than a pencil eraser, or moles that have changed in shape or color.  Stay current with required immunizations.  Influenza vaccine. All adults should be immunized every year.  Tetanus, diphtheria, and acellular pertussis (Td, Tdap) vaccine. Pregnant women should receive 1 dose of Tdap vaccine during each pregnancy. The dose should be obtained regardless of the length of time since the last dose. Immunization is preferred during the 27th to 36th week of gestation. An adult who has not previously received Tdap or who does not know her vaccine status should receive 1 dose of Tdap. This initial dose should be followed by tetanus and diphtheria toxoids (Td) booster doses every 10 years. Adults with an unknown or incomplete history of completing a 3-dose immunization series with Td-containing vaccines should begin or complete a primary immunization series including a Tdap dose. Adults should receive a Td booster every 10 years.  Varicella vaccine. An adult without evidence of immunity to varicella  should receive 2 doses or a second dose if she has previously received 1 dose. Pregnant females who do not have evidence of immunity should receive the first dose after pregnancy. This first dose should be obtained before leaving the health care facility. The second dose should be obtained 4 8 weeks after the first dose.  Human papillomavirus (HPV) vaccine. Females aged 13 26 years who have not received the vaccine previously should obtain the 3-dose series. The vaccine is not recommended for use in pregnant females. However, pregnancy testing is not needed before receiving a dose. If a female is found to be pregnant after receiving a dose, no treatment is needed. In that case, the remaining doses should be delayed until after the pregnancy. Immunization is recommended for any person with an immunocompromised condition through the age of 26 years if she did not get any or all doses earlier. During the 3-dose series, the second dose should be obtained 4 8 weeks after the first dose. The third dose should be obtained 24 weeks after the first dose and 16 weeks after the second dose.  Zoster vaccine. One dose is recommended for adults aged 60 years or older unless certain conditions are present.  Measles, mumps, and rubella (MMR) vaccine. Adults born before 1957 generally are considered immune to measles and mumps. Adults born in 1957 or later should have 1 or more doses of MMR vaccine unless there is a contraindication to the vaccine or there is laboratory evidence of immunity to each of the three diseases. A routine second dose of MMR vaccine should be obtained at least 28 days after the first dose for students attending postsecondary schools, health care workers, or international travelers. People who received inactivated measles vaccine or an unknown type of measles vaccine during 1963 1967 should receive 2 doses of MMR vaccine. People who received inactivated mumps vaccine or an unknown type of mumps vaccine  before 1979 and are at high risk for mumps infection should consider immunization with 2 doses of MMR vaccine. For females of childbearing age, rubella immunity should be determined. If there is no evidence of immunity, females who are not pregnant should be vaccinated. If there   is no evidence of immunity, females who are pregnant should delay immunization until after pregnancy. Unvaccinated health care workers born before 1957 who lack laboratory evidence of measles, mumps, or rubella immunity or laboratory confirmation of disease should consider measles and mumps immunization with 2 doses of MMR vaccine or rubella immunization with 1 dose of MMR vaccine.  Pneumococcal 13-valent conjugate (PCV13) vaccine. When indicated, a person who is uncertain of her immunization history and has no record of immunization should receive the PCV13 vaccine. An adult aged 19 years or older who has certain medical conditions and has not been previously immunized should receive 1 dose of PCV13 vaccine. This PCV13 should be followed with a dose of pneumococcal polysaccharide (PPSV23) vaccine. The PPSV23 vaccine dose should be obtained at least 8 weeks after the dose of PCV13 vaccine. An adult aged 19 years or older who has certain medical conditions and previously received 1 or more doses of PPSV23 vaccine should receive 1 dose of PCV13. The PCV13 vaccine dose should be obtained 1 or more years after the last PPSV23 vaccine dose.  Pneumococcal polysaccharide (PPSV23) vaccine. When PCV13 is also indicated, PCV13 should be obtained first. All adults aged 65 years and older should be immunized. An adult younger than age 65 years who has certain medical conditions should be immunized. Any person who resides in a nursing home or long-term care facility should be immunized. An adult smoker should be immunized. People with an immunocompromised condition and certain other conditions should receive both PCV13 and PPSV23 vaccines. People  with human immunodeficiency virus (HIV) infection should be immunized as soon as possible after diagnosis. Immunization during chemotherapy or radiation therapy should be avoided. Routine use of PPSV23 vaccine is not recommended for American Indians, [place] Natives, or people younger than 65 years unless there are medical conditions that require PPSV23 vaccine. When indicated, people who have unknown immunization and have no record of immunization should receive PPSV23 vaccine. One-time revaccination 5 years after the first dose of PPSV23 is recommended for people aged 19 64 years who have chronic kidney failure, nephrotic syndrome, asplenia, or immunocompromised conditions. People who received 1 2 doses of PPSV23 before age 65 years should receive another dose of PPSV23 vaccine at age 65 years or later if at least 5 years have passed since the previous dose. Doses of PPSV23 are not needed for people immunized with PPSV23 at or after age 65 years.  Meningococcal vaccine. Adults with asplenia or persistent complement component deficiencies should receive 2 doses of quadrivalent meningococcal conjugate (MenACWY-D) vaccine. The doses should be obtained at least 2 months apart. Microbiologists working with certain meningococcal bacteria, military recruits, people at risk during an outbreak, and people who travel to or live in countries with a high rate of meningitis should be immunized. A first-year college student up through age 21 years who is living in a residence hall should receive a dose if she did not receive a dose on or after her 16th birthday. Adults who have certain high-risk conditions should receive one or more doses of vaccine.  Hepatitis A vaccine. Adults who wish to be protected from this disease, have certain high-risk conditions, work with hepatitis A-infected animals, work in hepatitis A research labs, or travel to or work in countries with a high rate of hepatitis A should be immunized. Adults  who were previously unvaccinated and who anticipate close contact with an international adoptee during the first 60 days after arrival in the [place] from a country   with a high rate of hepatitis A should be immunized.  Hepatitis B vaccine. Adults who wish to be protected from this disease, have certain high-risk conditions, may be exposed to blood or other infectious body fluids, are household contacts or sex partners of hepatitis B positive people, are clients or workers in certain care facilities, or travel to or work in countries with a high rate of hepatitis B should be immunized.  Haemophilus influenzae type b (Hib) vaccine. A previously unvaccinated person with asplenia or sickle cell disease or having a scheduled splenectomy should receive 1 dose of Hib vaccine. Regardless of previous immunization, a recipient of a hematopoietic stem cell transplant should receive a 3-dose series 6 12 months after her successful transplant. Hib vaccine is not recommended for adults with HIV infection. Preventive Services / Frequency Ages 19 to 39  Blood pressure check.** / Every 1 to 2 years.  Lipid and cholesterol check.** / Every 5 years beginning at age 20.  Clinical breast exam.** / Every 3 years for women in their 20s and 30s.  BRCA-related cancer risk assessment.** / For women who have family members with a BRCA-related cancer (breast, ovarian, tubal, or peritoneal cancers).  Pap test.** / Every 2 years from ages 21 through 29. Every 3 years starting at age 30 through age 65 or 70 with a history of 3 consecutive normal Pap tests.  HPV screening.** / Every 3 years from ages 30 through ages 65 to 70 with a history of 3 consecutive normal Pap tests.  Hepatitis C blood test.** / For any individual with known risks for hepatitis C.  Skin self-exam. / Monthly.  Influenza vaccine. / Every year.  Tetanus, diphtheria, and acellular pertussis (Tdap, Td) vaccine.** / Consult your caregiver. Pregnant  women should receive 1 dose of Tdap vaccine during each pregnancy. 1 dose of Td every 10 years.  Varicella vaccine.** / Consult your caregiver. Pregnant females who do not have evidence of immunity should receive the first dose after pregnancy.  HPV vaccine. / 3 doses over 6 months, if 26 and younger. The vaccine is not recommended for use in pregnant females. However, pregnancy testing is not needed before receiving a dose.  Measles, mumps, rubella (MMR) vaccine.** / You need at least 1 dose of MMR if you were born in 1957 or later. You may also need a 2nd dose. For females of childbearing age, rubella immunity should be determined. If there is no evidence of immunity, females who are not pregnant should be vaccinated. If there is no evidence of immunity, females who are pregnant should delay immunization until after pregnancy.  Pneumococcal 13-valent conjugate (PCV13) vaccine.** / Consult your caregiver.  Pneumococcal polysaccharide (PPSV23) vaccine.** / 1 to 2 doses if you smoke cigarettes or if you have certain conditions.  Meningococcal vaccine.** / 1 dose if you are age 19 to 21 years and a first-year college student living in a residence hall, or have one of several medical conditions, you need to get vaccinated against meningococcal disease. You may also need additional booster doses.  Hepatitis A vaccine.** / Consult your caregiver.  Hepatitis B vaccine.** / Consult your caregiver.  Haemophilus influenzae type b (Hib) vaccine.** / Consult your caregiver. Ages 40 to 64  Blood pressure check.** / Every 1 to 2 years.  Lipid and cholesterol check.** / Every 5 years beginning at age 20.  Lung cancer screening. / Every year if you are aged 55 80 years and have a 30-pack-year history of smoking and   currently smoke or have quit within the past 15 years. Yearly screening is stopped once you have quit smoking for at least 15 years or develop a health problem that would prevent you from having  lung cancer treatment.  Clinical breast exam.** / Every year after age 40.  BRCA-related cancer risk assessment.** / For women who have family members with a BRCA-related cancer (breast, ovarian, tubal, or peritoneal cancers).  Mammogram.** / Every year beginning at age 40 and continuing for as long as you are in good health. Consult with your caregiver.  Pap test.** / Every 3 years starting at age 30 through age 65 or 70 with a history of 3 consecutive normal Pap tests.  HPV screening.** / Every 3 years from ages 30 through ages 65 to 70 with a history of 3 consecutive normal Pap tests.  Fecal occult blood test (FOBT) of stool. / Every year beginning at age 50 and continuing until age 75. You may not need to do this test if you get a colonoscopy every 10 years.  Flexible sigmoidoscopy or colonoscopy.** / Every 5 years for a flexible sigmoidoscopy or every 10 years for a colonoscopy beginning at age 50 and continuing until age 75.  Hepatitis C blood test.** / For all people born from 1945 through 1965 and any individual with known risks for hepatitis C.  Skin self-exam. / Monthly.  Influenza vaccine. / Every year.  Tetanus, diphtheria, and acellular pertussis (Tdap/Td) vaccine.** / Consult your caregiver. Pregnant women should receive 1 dose of Tdap vaccine during each pregnancy. 1 dose of Td every 10 years.  Varicella vaccine.** / Consult your caregiver. Pregnant females who do not have evidence of immunity should receive the first dose after pregnancy.  Zoster vaccine.** / 1 dose for adults aged 60 years or older.  Measles, mumps, rubella (MMR) vaccine.** / You need at least 1 dose of MMR if you were born in 1957 or later. You may also need a 2nd dose. For females of childbearing age, rubella immunity should be determined. If there is no evidence of immunity, females who are not pregnant should be vaccinated. If there is no evidence of immunity, females who are pregnant should delay  immunization until after pregnancy.  Pneumococcal 13-valent conjugate (PCV13) vaccine.** / Consult your caregiver.  Pneumococcal polysaccharide (PPSV23) vaccine.** / 1 to 2 doses if you smoke cigarettes or if you have certain conditions.  Meningococcal vaccine.** / Consult your caregiver.  Hepatitis A vaccine.** / Consult your caregiver.  Hepatitis B vaccine.** / Consult your caregiver.  Haemophilus influenzae type b (Hib) vaccine.** / Consult your caregiver. Ages 65 and over  Blood pressure check.** / Every 1 to 2 years.  Lipid and cholesterol check.** / Every 5 years beginning at age 20.  Lung cancer screening. / Every year if you are aged 55 80 years and have a 30-pack-year history of smoking and currently smoke or have quit within the past 15 years. Yearly screening is stopped once you have quit smoking for at least 15 years or develop a health problem that would prevent you from having lung cancer treatment.  Clinical breast exam.** / Every year after age 40.  BRCA-related cancer risk assessment.** / For women who have family members with a BRCA-related cancer (breast, ovarian, tubal, or peritoneal cancers).  Mammogram.** / Every year beginning at age 40 and continuing for as long as you are in good health. Consult with your caregiver.  Pap test.** / Every 3 years starting at age   30 through age 65 or 70 with a 3 consecutive normal Pap tests. Testing can be stopped between 65 and 70 with 3 consecutive normal Pap tests and no abnormal Pap or HPV tests in the past 10 years.  HPV screening.** / Every 3 years from ages 30 through ages 65 or 70 with a history of 3 consecutive normal Pap tests. Testing can be stopped between 65 and 70 with 3 consecutive normal Pap tests and no abnormal Pap or HPV tests in the past 10 years.  Fecal occult blood test (FOBT) of stool. / Every year beginning at age 50 and continuing until age 75. You may not need to do this test if you get a colonoscopy  every 10 years.  Flexible sigmoidoscopy or colonoscopy.** / Every 5 years for a flexible sigmoidoscopy or every 10 years for a colonoscopy beginning at age 50 and continuing until age 75.  Hepatitis C blood test.** / For all people born from 1945 through 1965 and any individual with known risks for hepatitis C.  Osteoporosis screening.** / A one-time screening for women ages 65 and over and women at risk for fractures or osteoporosis.  Skin self-exam. / Monthly.  Influenza vaccine. / Every year.  Tetanus, diphtheria, and acellular pertussis (Tdap/Td) vaccine.** / 1 dose of Td every 10 years.  Varicella vaccine.** / Consult your caregiver.  Zoster vaccine.** / 1 dose for adults aged 60 years or older.  Pneumococcal 13-valent conjugate (PCV13) vaccine.** / Consult your caregiver.  Pneumococcal polysaccharide (PPSV23) vaccine.** / 1 dose for all adults aged 65 years and older.  Meningococcal vaccine.** / Consult your caregiver.  Hepatitis A vaccine.** / Consult your caregiver.  Hepatitis B vaccine.** / Consult your caregiver.  Haemophilus influenzae type b (Hib) vaccine.** / Consult your caregiver. ** Family history and personal history of risk and conditions may change your caregiver's recommendations. Document Released: 11/20/2001 Document Revised: 01/19/2013 Document Reviewed: 02/19/2011 [employer] Patient Information 2014 [employer].  

## 2013-08-13 NOTE — Telephone Encounter (Signed)
Unable to reach pre visit.  

## 2013-08-13 NOTE — Progress Notes (Signed)
Subjective:    Rachel Nichols is a 79 y.o. female who presents for Medicare Annual/Subsequent preventive examination.  Preventive Screening-Counseling & Management  Tobacco History  Smoking status  . Former Smoker -- 2.00 packs/day for 15 years  . Types: Cigarettes  . Quit date: 10/09/1975  Smokeless tobacco  . Never Used     Problems Prior to Visit 1.   Current Problems (verified) Patient Active Problem List   Diagnosis Date Noted  . Essential hypertension 03/06/2013  . CAD (coronary artery disease) 04/14/2012  . GASTRITIS 10/19/2009  . ABDOMINAL PAIN-EPIGASTRIC 10/18/2009  . CHOLECYSTECTOMY, HX OF 10/18/2009  . DIVERTICULOSIS, COLON 10/14/2009  . COLONIC POLYPS, ADENOMATOUS, HX OF 10/14/2009  . HIATAL HERNIA WITH REFLUX 09/19/2009  . WEIGHT LOSS 09/19/2009  . PUD, HX OF 09/19/2009  . HYPERLIPIDEMIA 05/27/2008  . BACK PAIN 05/27/2008    Medications Prior to Visit Current Outpatient Prescriptions on File Prior to Visit  Medication Sig Dispense Refill  . aspirin 325 MG tablet Take 325 mg by mouth daily.      Marland Kitchen atenolol (TENORMIN) 25 MG tablet Take 12.5 mg by mouth daily.      . Biotin 1000 MCG tablet Take 1,000 mcg by mouth daily. 2      . cholecalciferol (VITAMIN D) 1000 UNITS tablet Take 2,000 Units by mouth daily.      . Coenzyme Q10 (CO Q10) 100 MG CAPS Take 200 mg by mouth daily.      . Cyanocobalamin (VITAMIN B 12 PO) Take 500 mg by mouth daily.      Marland Kitchen ezetimibe (ZETIA) 10 MG tablet Take 1 tablet (10 mg total) by mouth daily.  90 tablet  1  . fish oil-omega-3 fatty acids 1000 MG capsule Take 1 g by mouth 3 (three) times daily.      . minocycline (MINOCIN,DYNACIN) 100 MG capsule Take 1 capsule by mouth 2 (two) times a week.      . Multiple Vitamins-Minerals (CENTRUM SILVER ULTRA WOMENS) TABS Take 1 tablet by mouth daily.      . potassium phosphate, monobasic, (K-PHOS ORIGINAL) 500 MG tablet Take 500 mg by mouth daily.      . rosuvastatin (CRESTOR) 10 MG tablet  Take 10 mg by mouth daily.       No current facility-administered medications on file prior to visit.    Current Medications (verified) Current Outpatient Prescriptions  Medication Sig Dispense Refill  . aspirin 325 MG tablet Take 325 mg by mouth daily.      Marland Kitchen atenolol (TENORMIN) 25 MG tablet Take 12.5 mg by mouth daily.      . Biotin 1000 MCG tablet Take 1,000 mcg by mouth daily. 2      . cholecalciferol (VITAMIN D) 1000 UNITS tablet Take 2,000 Units by mouth daily.      . Coenzyme Q10 (CO Q10) 100 MG CAPS Take 200 mg by mouth daily.      . Cyanocobalamin (VITAMIN B 12 PO) Take 500 mg by mouth daily.      Marland Kitchen ezetimibe (ZETIA) 10 MG tablet Take 1 tablet (10 mg total) by mouth daily.  90 tablet  1  . fish oil-omega-3 fatty acids 1000 MG capsule Take 1 g by mouth 3 (three) times daily.      . magnesium 30 MG tablet Take 30 mg by mouth 2 (two) times daily.      . minocycline (MINOCIN,DYNACIN) 100 MG capsule Take 1 capsule by mouth 2 (two) times a week.      Marland Kitchen  Multiple Vitamins-Minerals (CENTRUM SILVER ULTRA WOMENS) TABS Take 1 tablet by mouth daily.      . potassium phosphate, monobasic, (K-PHOS ORIGINAL) 500 MG tablet Take 500 mg by mouth daily.      . rosuvastatin (CRESTOR) 10 MG tablet Take 10 mg by mouth daily.       No current facility-administered medications for this visit.     Allergies (verified) Diltiazem hcl; Tape; and Tramadol hcl   PAST HISTORY  Family History Family History  Problem Relation Age of Onset  . Breast cancer Maternal Aunt     x 2 aunts  . Heart disease Brother 26    MI  . Heart disease Sister     stents  . Neuropathy Sister   . Hypertension Sister   . Stroke Mother   . Heart disease Father   . Irritable bowel syndrome Brother   . Heart attack Father   . Stroke Maternal Grandfather   . Heart attack Paternal Grandmother   . Heart attack Brother   . Heart attack Paternal Grandfather     Social History History  Substance Use Topics  . Smoking  status: Former Smoker -- 2.00 packs/day for 15 years    Types: Cigarettes    Quit date: 10/09/1975  . Smokeless tobacco: Never Used  . Alcohol Use: No     Are there smokers in your home (other than you)? No  Risk Factors Current exercise habits: swimming 2x a week and exercise class x2 days Dietary issues discussed: na   Cardiac risk factors: advanced age (older than 31 for men, 73 for women), dyslipidemia and hypertension.  Depression Screen (Note: if answer to either of the following is "Yes", a more complete depression screening is indicated)   Over the past two weeks, have you felt down, depressed or hopeless? No  Over the past two weeks, have you felt little interest or pleasure in doing things? No  Have you lost interest or pleasure in daily life? No  Do you often feel hopeless? No  Do you cry easily over simple problems? No  Activities of Daily Living In your present state of health, do you have any difficulty performing the following activities?:  Driving? No Managing money?  No Feeding yourself? No Getting from bed to chair? No Climbing a flight of stairs? No Preparing food and eating?: No Bathing or showering? No Getting dressed: No Getting to the toilet? No Using the toilet:No Moving around from place to place: No In the past year have you fallen or had a near fall?:No   Are you sexually active?  Yes  Do you have more than one partner?  No  Hearing Difficulties: No Do you often ask people to speak up or repeat themselves? No Do you experience ringing or noises in your ears? No Do you have difficulty understanding soft or whispered voices? No   Do you feel that you have a problem with memory? No  Do you often misplace items? No  Do you feel safe at home?  No  Cognitive Testing  Alert? Yes  Normal Appearance?Yes  Oriented to person? Yes  Place? Yes   Time? Yes  Recall of three objects?  Yes  Can perform simple calculations? Yes  Displays appropriate  judgment?Yes  Can read the correct time from a watch face?Yes   Advanced Directives have been discussed with the patient? Yes  List the Names of Other Physician/Practitioners you currently use: 1.  Derm -- Dr Spero Curb-- [facility]  2.  Dentist- [place] 3    opth- Dr Nile Riggs 4. Cardio-- Corliss Marcus any recent Medical Services you may have received from other than [facility] providers in the past year (date may be approximate).  Immunization History  Administered Date(s) Administered  . Influenza Split 08/25/2012  . Influenza Whole 08/19/2008, 09/19/2009  . Influenza,inj,Quad PF,36+ Mos 08/03/2013  . Pneumococcal Polysaccharide 04/14/2012    Screening Tests Health Maintenance  Topic Date Due  . Tetanus/tdap  05/11/1950  . Mammogram  12/29/2013  . Influenza Vaccine  05/08/2014  . Pneumococcal Polysaccharide Vaccine Age 27 And Over  Completed  . Zostavax  Completed    All answers were reviewed with the patient and necessary referrals were made:  Loreen Freud, DO   08/13/2013   History reviewed:  She  has a past medical history of Hyperlipidemia; Basal cell carcinoma; Heart attack (1993); Diverticulosis [phone]); Benign neoplasm of colon (2004); PUD (peptic ulcer disease); GERD (gastroesophageal reflux disease); Hiatal hernia; Gastritis; Duodenitis; History of blood transfusion; CAD (coronary artery disease); Fibrocystic breast disease; Adenomatous colon polyp (2002); and Carotid bruit. She  does not have any pertinent problems on file. She  has past surgical history that includes Excision basal cell carcinoma; Cataract extraction (10/26/09); Cataract extraction (11/09/09); Finger surgery (04/2011); Coronary stent placement (01/10/11); Cholecystectomy (1984); Angioplasty (1993); Dilation and curettage of uterus; and Left oophorectomy (1957). Her family history includes Breast cancer in her maternal aunt; Heart attack in her brother, father, paternal grandfather, and paternal  grandmother; Heart disease in her father and sister; Heart disease (age of onset: 92) in her brother; Hypertension in her sister; Irritable bowel syndrome in her brother; Neuropathy in her sister; Stroke in her maternal grandfather and mother. She  reports that she quit smoking about 37 years ago. Her smoking use included Cigarettes. She has a 30 pack-year smoking history. She has never used smokeless tobacco. She reports that she does not drink alcohol or use illicit drugs. She has a current medication list which includes the following prescription(s): aspirin, atenolol, biotin, cholecalciferol, co q10, cyanocobalamin, ezetimibe, fish oil-omega-3 fatty acids, magnesium, minocycline, centrum silver ultra womens, potassium phosphate (monobasic), and rosuvastatin. Current Outpatient Prescriptions on File Prior to Visit  Medication Sig Dispense Refill  . aspirin 325 MG tablet Take 325 mg by mouth daily.      Marland Kitchen atenolol (TENORMIN) 25 MG tablet Take 12.5 mg by mouth daily.      . Biotin 1000 MCG tablet Take 1,000 mcg by mouth daily. 2      . cholecalciferol (VITAMIN D) 1000 UNITS tablet Take 2,000 Units by mouth daily.      . Coenzyme Q10 (CO Q10) 100 MG CAPS Take 200 mg by mouth daily.      . Cyanocobalamin (VITAMIN B 12 PO) Take 500 mg by mouth daily.      Marland Kitchen ezetimibe (ZETIA) 10 MG tablet Take 1 tablet (10 mg total) by mouth daily.  90 tablet  1  . fish oil-omega-3 fatty acids 1000 MG capsule Take 1 g by mouth 3 (three) times daily.      . minocycline (MINOCIN,DYNACIN) 100 MG capsule Take 1 capsule by mouth 2 (two) times a week.      . Multiple Vitamins-Minerals (CENTRUM SILVER ULTRA WOMENS) TABS Take 1 tablet by mouth daily.      . potassium phosphate, monobasic, (K-PHOS ORIGINAL) 500 MG tablet Take 500 mg by mouth daily.      . rosuvastatin (CRESTOR) 10 MG tablet Take 10 mg  by mouth daily.       No current facility-administered medications on file prior to visit.   She is allergic to diltiazem  hcl; tape; and tramadol hcl.  Review of Systems Review of Systems  Constitutional: Negative for activity change, appetite change and fatigue.  HENT: Negative for hearing loss, congestion, tinnitus and ear discharge.  dentist q35m Eyes: Negative for visual disturbance (see optho q1y -- vision corrected to 20/20 with glasses).  Respiratory: Negative for cough, chest tightness and shortness of breath.   Cardiovascular: Negative for chest pain, palpitations and leg swelling.  Gastrointestinal: Negative for abdominal pain, diarrhea, constipation and abdominal distention.  Genitourinary: Negative for urgency, frequency, decreased urine volume and difficulty urinating.  Musculoskeletal: Negative for back pain, arthralgias and gait problem.  Skin: Negative for color change, pallor and rash.  Neurological: Negative for dizziness, light-headedness, numbness and headaches.  Hematological: Negative for adenopathy. Does not bruise/bleed easily.  Psychiatric/Behavioral: Negative for suicidal ideas, confusion, sleep disturbance, self-injury, dysphoric mood, decreased concentration and agitation.        Objective:     Vision by Snellen chart: opth  Body mass index is 24.29 kg/(m^2). BP 100/58  Pulse 54  Temp(Src) 98.6 F (37 C) (Oral)  Ht 5\' 4"  (1.626 m)  Wt 141 lb 9.6 oz (64.229 kg)  BMI 24.29 kg/m2  SpO2 98%  BP 100/58  Pulse 54  Temp(Src) 98.6 F (37 C) (Oral)  Ht 5\' 4"  (1.626 m)  Wt 141 lb 9.6 oz (64.229 kg)  BMI 24.29 kg/m2  SpO2 98% General appearance: alert, cooperative, appears stated age and no distress Head: Normocephalic, without obvious abnormality, atraumatic Eyes: negative findings: pupils equal, round, reactive to light and accomodation Ears: normal TM's and external ear canals both ears Nose: Nares normal. Septum midline. Mucosa normal. No drainage or sinus tenderness. Throat: lips, mucosa, and tongue normal; teeth and gums normal Neck: no adenopathy, no carotid  bruit, no JVD, supple, symmetrical, trachea midline and thyroid not enlarged, symmetric, no tenderness/mass/nodules Back: symmetric, no curvature. ROM normal. No CVA tenderness. Lungs: clear to auscultation bilaterally Breasts: normal appearance, no masses or tenderness Heart: S1, S2 normal Abdomen: soft, non-tender; bowel sounds normal; no masses,  no organomegaly Pelvic: not indicated; post-menopausal, no abnormal Pap smears in past Extremities: extremities normal, atraumatic, no cyanosis or edema Pulses: 2+ and symmetric Skin: Skin color, texture, turgor normal. No rashes or lesions Lymph nodes: Cervical, supraclavicular, and axillary nodes normal. Neurologic: Alert and oriented X 3, normal strength and tone. Normal symmetric reflexes. Normal coordination and gait Psych-- no depression, no anxiety      Assessment:     cpe      Plan:     During the course of the visit the patient was educated and counseled about appropriate screening and preventive services including:    Pneumococcal vaccine   Influenza vaccine  Td vaccine  Screening mammography  Bone densitometry screening  Colorectal cancer screening  Glaucoma screening  Advanced directives: has an advanced directive - a copy HAS NOT been provided.  Diet review for nutrition referral? Yes ____  Not Indicated x____   Patient Instructions (the written plan) was given to the patient.  Medicare Attestation I have personally reviewed: The patient's medical and social history Their use of alcohol, tobacco or illicit drugs Their current medications and supplements The patient's functional ability including ADLs,fall risks, home safety risks, cognitive, and hearing and visual impairment Diet and physical activities Evidence for depression or mood disorders  The  patient's weight, height, BMI, and visual acuity have been recorded in the chart.  I have made referrals, counseling, and provided education to the patient  based on review of the above and I have provided the patient with a written personalized care plan for preventive services.     Loreen Freud, DO   08/13/2013

## 2013-08-13 NOTE — Assessment & Plan Note (Signed)
Check labs con't meds 

## 2013-08-31 ENCOUNTER — Ambulatory Visit (INDEPENDENT_AMBULATORY_CARE_PROVIDER_SITE_OTHER): Payer: Medicare Other | Admitting: Cardiovascular Disease

## 2013-08-31 ENCOUNTER — Encounter: Payer: Self-pay | Admitting: Cardiovascular Disease

## 2013-08-31 VITALS — BP 130/60 | HR 53 | Ht 65.5 in | Wt 145.0 lb

## 2013-08-31 DIAGNOSIS — E785 Hyperlipidemia, unspecified: Secondary | ICD-10-CM

## 2013-08-31 DIAGNOSIS — I2510 Atherosclerotic heart disease of native coronary artery without angina pectoris: Secondary | ICD-10-CM | POA: Diagnosis not present

## 2013-08-31 DIAGNOSIS — I10 Essential (primary) hypertension: Secondary | ICD-10-CM

## 2013-08-31 NOTE — Progress Notes (Signed)
Patient ID: Rachel Nichols, female   DOB: 02-28-1931, 79 y.o.   MRN: [id number]       HPI: Rachel Nichols, is a 79 y.o. female who presents to the office for a 6 month cardiology evaluation.   Rachel Nichols is  79 years old WF who suffered a myocardial infarction in January 1993 and underwent PTCA of her right coronary artery. In April 2012, a 2.5x12 mm resolute DES stent was inserted to the proximal portion of a ramus intermediate vessel. She did have 60% LAD stenosis after diagonal vessel as well as 40-50% mid right coronary artery stenosis for which she's been treated medically. Her last myocardial perfusion study in May 2013  remained normal. She has continued to remain stable on her current medical regimen. We had increased her Crestor to 10 mg to take in addition to this area there LDL particle concentration was still elevated at 1387. Subsequent blood work done on 11/10/2012 did show marked improvement with her LDL now at 61 and LDL particle number at 1027 the total cholesterol was 126, HDL 43, triglycerides 108. Her insulin resistance score was still slightly elevated at 56. Over the past 6 months, she continues to be stable. She swims several days per week. She does walk. She denies chest pain or shortness of breath. She recently had followup laboratory done by her primary physician on 08/13/2013. Presently, she denies chest pain or shortness of breath. She denies palpitations. There is no presyncope or syncope.  Past Medical History  Diagnosis Date  . Hyperlipidemia   . Basal cell carcinoma     Nose  . Heart attack 1993    Cardiolyte stress---  stents 2012  . Diverticulosis 2002,2004,2007    Colonoscopy  . Benign neoplasm of colon 2004    Colonoscopy  . PUD (peptic ulcer disease)   . GERD (gastroesophageal reflux disease)   . Hiatal hernia   . Gastritis   . Duodenitis   . History of blood transfusion     pt denies  . CAD (coronary artery disease)     echo 09/13/10- EF>55%, aortic valve is  mildly sclerotic; myoview 02-20-12-no ischemia  . Fibrocystic breast disease   . Adenomatous colon polyp 2002    Colonoscopy   . Carotid bruit     carotid dopplers 05/16/04- normal    Past Surgical History  Procedure Laterality Date  . Basal cell carcinoma excision      From the nose  . Cataract extraction  10/26/09    Right eye  . Cataract extraction  11/09/09    Left eye  . Finger surgery  04/2011    Cyst removal, right hand forefinger  . Coronary stent placement  01/10/11    hig grade ramus branch stenosis with mod LAD dz, PCI/stenting of ramus branch with a Resolute DES   . Cholecystectomy  1984  . Angioplasty  1993  . Dilation and curettage of uterus    . Left oophorectomy  1957    Allergies  Allergen Reactions  . Diltiazem Hcl   . Tape Other (See Comments)    It pulls the skin off.  . Tramadol Hcl     Current Outpatient Prescriptions  Medication Sig Dispense Refill  . aspirin 325 MG tablet Take 325 mg by mouth daily.      Marland Kitchen atenolol (TENORMIN) 25 MG tablet Take 12.5 mg by mouth daily.      . Biotin 1000 MCG tablet Take 2,000 mcg by mouth daily. 2      .  cholecalciferol (VITAMIN D) 1000 UNITS tablet Take 2,000 Units by mouth daily.      . Coenzyme Q10 (CO Q10) 100 MG CAPS Take 200 mg by mouth daily.      . Cyanocobalamin (VITAMIN B 12 PO) Take 500 mg by mouth daily.      Marland Kitchen ezetimibe (ZETIA) 10 MG tablet Take 1 tablet (10 mg total) by mouth daily.  90 tablet  1  . fish oil-omega-3 fatty acids 1000 MG capsule Take 1 g by mouth 3 (three) times daily.      . Flaxseed, Linseed, (FLAX SEED OIL) 1000 MG CAPS Take 1 capsule by mouth daily.      . magnesium 30 MG tablet Take 30 mg by mouth 2 (two) times daily.      . minocycline (MINOCIN,DYNACIN) 100 MG capsule Take 1 capsule by mouth 2 (two) times a week.      . Multiple Vitamins-Minerals (CENTRUM SILVER ULTRA WOMENS) TABS Take 1 tablet by mouth daily.      . potassium phosphate, monobasic, (K-PHOS ORIGINAL) 500 MG tablet Take 500  mg by mouth daily.      . rosuvastatin (CRESTOR) 10 MG tablet Take 10 mg by mouth daily.       No current facility-administered medications for this visit.   Socially she is married. Has one child who is living and one child who is deceased. She has 3 grandchildren. There is no tobacco or alcohol use.  ROS is negative for fever chills night sweats. She denies skin changes. There is no change in vision or hearing. She denies fatigue. She feels that she is sleeping well. She denies wheezing. She denies significant shortness of breath. She denies angina symptoms. He denies palpitations. There are no episodes of presyncope or syncope. She denies abdominal pain. She denies bleeding. She denies melena or hematochezia. She denies claudication. She denies paresthesias. She does admit to some episodes of mild aches in her muscles and for this reason she has reduced her Crestor to 10 mg alternating with 5 mg every other day with tolerability. She does take coenzyme Q10. She denies cold or heat intolerance. Other comprehensive 12 point system review is negative.  PE BP 130/60  Pulse 53  Ht 5' 5.5" (1.664 m)  Wt 145 lb (65.772 kg)  BMI 23.75 kg/m2  General: Alert, oriented, no distress.  HEENT: Normocephalic, atraumatic. Pupils round and reactive; sclera anicteric;  Nose without nasal septal hypertrophy Mouth/Parynx benign; Mallinpatti scale 2 Neck: No JVD, no carotid briuts Lungs: clear to ausculatation and percussion; no wheezing or rales Heart: RRR, s1 s2 normal 6 systolic murmur Abdomen: soft, nontender; no hepatosplenomehaly, BS+; abdominal aorta nontender and not dilated by palpation. Pulses 2+ Extremities: no clubbinbg cyanosis or edema, Homan's sign negative  Neurologic: grossly nonfocal Psychologic: Normal affect and mood  ECG: Sinus rhythm at 53 beats per minute. PR interval 166 ms QTc interval 426 ms.  LABS:  BMET    Component Value Date/Time   NA 139 08/13/2013 0914   K 4.2  08/13/2013 0914   CL 104 08/13/2013 0914   CO2 30 08/13/2013 0914   GLUCOSE 97 08/13/2013 0914   BUN 14 08/13/2013 0914   CREATININE 0.9 08/13/2013 0914   CALCIUM 10.1 08/13/2013 0914   GFRNONAA >60 01/11/2011 0605   GFRAA  Value: >60        The eGFR has been calculated using the MDRD equation. This calculation has not been validated in all clinical situations. eGFR's persistently <60  mL/min signify possible Chronic Kidney Disease. 01/11/2011 0605     Hepatic Function Panel     Component Value Date/Time   PROT 7.5 08/13/2013 0914   ALBUMIN 4.3 08/13/2013 0914   AST 25 08/13/2013 0914   ALT 24 08/13/2013 0914   ALKPHOS 39 08/13/2013 0914   BILITOT 0.5 08/13/2013 0914   BILIDIR 0.0 08/13/2013 0914     CBC    Component Value Date/Time   WBC 5.3 08/13/2013 0914   RBC 4.45 08/13/2013 0914   HGB 14.2 08/13/2013 0914   HCT 42.0 08/13/2013 0914   PLT 192.0 08/13/2013 0914   MCV 94.3 08/13/2013 0914   MCH 31.4 01/11/2011 0605   MCHC 33.8 08/13/2013 0914   RDW 14.2 08/13/2013 0914   LYMPHSABS 1.8 08/13/2013 0914   MONOABS 0.6 08/13/2013 0914   EOSABS 0.1 08/13/2013 0914   BASOSABS 0.0 08/13/2013 0914     BNP    Component Value Date/Time   PROBNP 86.0 01/10/2011 1357    Lipid Panel     Component Value Date/Time   CHOL 140 08/13/2013 0914   TRIG 142.0 08/13/2013 0914   HDL 46.40 08/13/2013 0914   CHOLHDL 3 08/13/2013 0914   VLDL 28.4 08/13/2013 0914   LDLCALC 65 08/13/2013 0914     RADIOLOGY: No results found.    ASSESSMENT AND PLAN: From a clinical cardiac standpoint, Ms. Safley is 21 years following her myocardial infarction and PTCA of her right coronary artery. She is 2 years status post stenting of an 80% stenosis in the proximal portion of a ramus intermediate vessel treated with a resolute DES stent. She now is on aspirin alone without Plavix. Her  recent NMR lipoprotein is significantly improved on the 10 mg Crestor and her most recent lipid studies on a slightly reduced dose at 10 mg  alternating with 5 mg along with Zetia 10 mg continues to show an LDL cholesterol at 65. Her blood pressure continues to be well-controlled on current therapy. I am suggesting she reduce her aspirin dose to 81 mg. She tells me she sees Dr. Laury Axon for primary care. As long as she remains stable, I will see her in one year for followup cartilage evaluation but we have to see her sooner problems arise.    Lennette Bihari, MD, [facility]  08/31/2013 3:02 PM

## 2013-08-31 NOTE — Patient Instructions (Signed)
Your physician recommends that you schedule a follow-up appointment in: 1 YEAR. No changes were made today in your therapy. 

## 2013-09-01 ENCOUNTER — Encounter: Payer: Self-pay | Admitting: Cardiovascular Disease

## 2013-09-07 DIAGNOSIS — Z961 Presence of intraocular lens: Secondary | ICD-10-CM | POA: Diagnosis not present

## 2013-12-28 ENCOUNTER — Ambulatory Visit (INDEPENDENT_AMBULATORY_CARE_PROVIDER_SITE_OTHER): Payer: Medicare Other | Admitting: Internal Medicine

## 2013-12-28 ENCOUNTER — Encounter: Payer: Self-pay | Admitting: Internal Medicine

## 2013-12-28 VITALS — BP 96/61 | HR 61 | Temp 97.9°F | Wt 146.6 lb

## 2013-12-28 DIAGNOSIS — M549 Dorsalgia, unspecified: Secondary | ICD-10-CM | POA: Diagnosis not present

## 2013-12-28 DIAGNOSIS — R109 Unspecified abdominal pain: Secondary | ICD-10-CM

## 2013-12-28 NOTE — Progress Notes (Signed)
Subjective:    Patient ID: Rachel Nichols, female    DOB: 1931/08/02, 80 y.o.   MRN: [id number]  DOS:  12/28/2013 Type of  visit: Acute visit  4 days history of B low  back pain and lower abdominal pain ("pain all around my waist"). Reports the pain is worse when she walks or laid down in certain positions, described as " lower abdominal discomfort as well as spasm in the back". The pain has been in both sides of the lower back but today  it is particularly severe on the right side, no radiation. She feels sx could be from constipation, 6 days ago -before the problem started- had a hard bowel movement and saw few drops of red blood in the commode. No further episodes. 3 days ago took a laxative and had subsequently 7 bowel movements, stools normal in color, no blood.  ROS No fever or any a rash No dysuria or gross hematuria. No cough, chest pain or difficulty breathing. Denies nausea, vomiting, diarrhea. No GERD symptoms. Appetite remains excellent. Denies taking any Motrin or similar medications d/t her history of PUD. She did admit to early satiety feeling but no actual epigastric pain  Past Medical History  Diagnosis Date  . Hyperlipidemia   . Basal cell carcinoma     Nose  . Heart attack 1993    Cardiolyte stress---  stents 2012  . Diverticulosis 2002,2004,2007    Colonoscopy  . Benign neoplasm of colon 2004    Colonoscopy  . PUD (peptic ulcer disease)   . GERD (gastroesophageal reflux disease)   . Hiatal hernia   . Gastritis   . Duodenitis   . History of blood transfusion     pt denies  . CAD (coronary artery disease)     echo 09/13/10- EF>55%, aortic valve is mildly sclerotic; myoview 02-20-12-no ischemia  . Fibrocystic breast disease   . Adenomatous colon polyp 2002    Colonoscopy   . Carotid bruit     carotid dopplers 05/16/04- normal    Past Surgical History  Procedure Laterality Date  . Basal cell carcinoma excision      From the nose  . Cataract extraction   10/26/09    Right eye  . Cataract extraction  11/09/09    Left eye  . Finger surgery  04/2011    Cyst removal, right hand forefinger  . Coronary stent placement  01/10/11    hig grade ramus branch stenosis with mod LAD dz, PCI/stenting of ramus branch with a Resolute DES   . Cholecystectomy  1984  . Angioplasty  1993  . Dilation and curettage of uterus    . Left oophorectomy  1957  . Appendectomy  1957    History   Social History  . Marital Status: Married    Spouse Name: N/A    Number of Children: 2  . Years of Education: N/A   Occupational History  . retired Radiation protection practitioner    Social History Main Topics  . Smoking status: Former Smoker -- 2.00 packs/day for 15 years    Types: Cigarettes    Quit date: 10/09/1975  . Smokeless tobacco: Never Used  . Alcohol Use: No  . Drug Use: No  . Sexual Activity: Not Currently    Partners: Male   Other Topics Concern  . Not on file   Social History Narrative   Exercise--  Gym 3 days a week,   2 days swim  Medication List       This list is accurate as of: 12/28/13  6:42 PM.  Always use your most recent med list.               aspirin 325 MG tablet  Take 325 mg by mouth daily.     atenolol 25 MG tablet  Commonly known as:  TENORMIN  Take 12.5 mg by mouth daily.     Biotin 1000 MCG tablet  Take 2,000 mcg by mouth daily. 2     CENTRUM SILVER ULTRA WOMENS Tabs  Take 1 tablet by mouth daily.     cholecalciferol 1000 UNITS tablet  Commonly known as:  VITAMIN D  Take 2,000 Units by mouth daily.     Co Q10 100 MG Caps  Take 200 mg by mouth daily.     ezetimibe 10 MG tablet  Commonly known as:  ZETIA  Take 1 tablet (10 mg total) by mouth daily.     fish oil-omega-3 fatty acids 1000 MG capsule  Take 1 g by mouth 3 (three) times daily.     Flax Seed Oil 1000 MG Caps  Take 1 capsule by mouth daily.     magnesium oxide 400 MG tablet  Commonly known as:  MAG-OX  Take 400 mg by mouth daily.     minocycline  100 MG capsule  Commonly known as:  MINOCIN,DYNACIN  Take 1 capsule by mouth 2 (two) times a week.     potassium phosphate (monobasic) 500 MG tablet  Commonly known as:  K-PHOS ORIGINAL  Take 500 mg by mouth daily.     rosuvastatin 10 MG tablet  Commonly known as:  CRESTOR  Take 10 mg by mouth daily.     VITAMIN B 12 PO  Take 500 mg by mouth daily.           Objective:   Physical Exam BP 96/61  Pulse 61  Temp(Src) 97.9 F (36.6 C) (Oral)  Wt 146 lb 9.6 oz (66.497 kg)  SpO2 96%  General -- alert, well-developed. HEENT-- Not pale.  Lungs -- normal respiratory effort, no intercostal retractions, no accessory muscle use, and normal breath sounds.  Heart-- normal rate, regular rhythm, no murmur.  Abdomen-- Not distended, good bowel sounds,soft, non-tender. No rebound or rigidity. No mass,organomegaly. Extremities-- no pretibial edema bilaterally  Neurologic--  alert & oriented X3. Gait is normal, she definitely had pain in the R back with asked her to lay down in the examining table, she adopted an antalgic posture.  Speech normal, .strength normal in all extremities.  Psych-- Cognition and judgment appear intact. Cooperative with normal attention span and concentration. No anxious or depressed appearing.       Assessment & Plan:    80 year old lady with history of CAD, GERD, remote PUD, cholecystectomy, appendectomy, left oophorectomy and diverticulosis (Cscope 2007) presents with back and abdominal pain.  The H&P points to a  muscle skeletal   rather than GI etiology of her sx particularly because her posture during examination. Abdominal exam is benign. She has mild constipation. Plan:  Labs, abdominal x-rays, MiraLax as she had some mild constipation. Otherwise will treat as a muscle skeletal issue and advise her to call if abdominal pain gets worse. Ortho referral.

## 2013-12-28 NOTE — Patient Instructions (Signed)
Get your blood work and urine test before you leave   Get the XR at [facility] and [address] (10 minutes form here); they are open 24/7 [place], [place] [address] [phone]   For pain, : Rest Tylenol,  Warm compress.  Take MiraLax 17 g daily with 6  Come back and see your primary MD In 2 weeks. Call anytime if you have fever, chills, feel a lot worse, developed a rash, or have increased abdominal pain.

## 2013-12-28 NOTE — Progress Notes (Signed)
Pre visit review using our clinic review tool, if applicable. No additional management support is needed unless otherwise documented below in the visit note. 

## 2013-12-29 ENCOUNTER — Ambulatory Visit (INDEPENDENT_AMBULATORY_CARE_PROVIDER_SITE_OTHER)
Admission: RE | Admit: 2013-12-29 | Discharge: 2013-12-29 | Disposition: A | Discharge status: Home / Self Care | Payer: Medicare Other | Source: Ambulatory Visit | Attending: Internal Medicine | Admitting: Internal Medicine

## 2013-12-29 DIAGNOSIS — R109 Unspecified abdominal pain: Secondary | ICD-10-CM

## 2013-12-29 DIAGNOSIS — K5900 Constipation, unspecified: Secondary | ICD-10-CM | POA: Diagnosis not present

## 2013-12-29 LAB — COMPREHENSIVE METABOLIC PANEL
ALK PHOS: 44 U/L (ref 39–117)
ALT: 28 U/L (ref 0–35)
AST: 29 U/L (ref 0–37)
Albumin: 4.3 g/dL (ref 3.5–5.2)
BUN: 19 mg/dL (ref 6–23)
CO2: 27 mEq/L (ref 19–32)
Calcium: 10 mg/dL (ref 8.4–10.5)
Chloride: 103 mEq/L (ref 96–112)
Creatinine, Ser: 1.1 mg/dL (ref 0.4–1.2)
GFR: 53.51 mL/min — AB (ref >60.00)
GLUCOSE: 95 mg/dL (ref 70–99)
Potassium: 4 mEq/L (ref 3.5–5.1)
SODIUM: 139 meq/L (ref 135–145)
TOTAL PROTEIN: 7.2 g/dL (ref 6.0–8.3)
Total Bilirubin: 0.4 mg/dL (ref 0.3–1.2)

## 2013-12-29 LAB — AMYLASE: AMYLASE: 94 U/L (ref 27–131)

## 2013-12-29 LAB — LIPASE: LIPASE: 24 U/L (ref 11.0–59.0)

## 2013-12-29 LAB — CBC WITH DIFFERENTIAL/PLATELET
Basophils Absolute: 0 10*3/uL (ref 0.0–0.1)
Basophils Relative: 0.5 % (ref 0.0–3.0)
EOS PCT: 1.4 % (ref 0.0–5.0)
Eosinophils Absolute: 0.1 10*3/uL (ref 0.0–0.7)
HCT: 41.2 % (ref 36.0–46.0)
Hemoglobin: 13.6 g/dL (ref 12.0–15.0)
LYMPHS PCT: 31 % (ref 12.0–46.0)
Lymphs Abs: 2.1 10*3/uL (ref 0.7–4.0)
MCHC: 33 g/dL (ref 30.0–36.0)
MCV: 95.4 fl (ref 78.0–100.0)
Monocytes Absolute: 0.7 10*3/uL (ref 0.1–1.0)
Monocytes Relative: 10.6 % (ref 3.0–12.0)
NEUTROS PCT: 56.5 % (ref 43.0–77.0)
Neutro Abs: 3.8 10*3/uL (ref 1.4–7.7)
Platelets: 183 10*3/uL (ref 150.0–400.0)
RBC: 4.32 Mil/uL (ref 3.87–5.11)
RDW: 13.5 % (ref 11.5–14.6)
WBC: 6.8 10*3/uL (ref 4.5–10.5)

## 2013-12-29 LAB — URINALYSIS, ROUTINE W REFLEX MICROSCOPIC
BILIRUBIN URINE: NEGATIVE
Hgb urine dipstick: NEGATIVE
Ketones, ur: NEGATIVE
LEUKOCYTES UA: NEGATIVE
Nitrite: NEGATIVE
RBC / HPF: NONE SEEN (ref 0–?)
SPECIFIC GRAVITY, URINE: 1.02 (ref 1.000–1.030)
Total Protein, Urine: NEGATIVE
UROBILINOGEN UA: 0.2 (ref 0.0–1.0)
Urine Glucose: NEGATIVE
WBC, UA: NONE SEEN (ref 0–?)
pH: 7 (ref 5.0–8.0)

## 2013-12-31 DIAGNOSIS — Z1231 Encounter for screening mammogram for malignant neoplasm of breast: Secondary | ICD-10-CM | POA: Diagnosis not present

## 2014-01-01 DIAGNOSIS — M5137 Other intervertebral disc degeneration, lumbosacral region: Secondary | ICD-10-CM | POA: Diagnosis not present

## 2014-01-01 DIAGNOSIS — M545 Low back pain, unspecified: Secondary | ICD-10-CM | POA: Diagnosis not present

## 2014-01-06 ENCOUNTER — Other Ambulatory Visit: Payer: Self-pay

## 2014-01-06 MED ORDER — ROSUVASTATIN CALCIUM 10 MG PO TABS: 10.0000 mg | ORAL_TABLET | Freq: Every day | ORAL | Status: DC

## 2014-01-06 NOTE — Telephone Encounter (Signed)
Rx was sent to pharmacy electronically. 

## 2014-01-11 ENCOUNTER — Encounter: Payer: Self-pay | Admitting: Family Medicine

## 2014-01-11 ENCOUNTER — Ambulatory Visit (INDEPENDENT_AMBULATORY_CARE_PROVIDER_SITE_OTHER): Payer: Medicare Other | Admitting: Family Medicine

## 2014-01-11 VITALS — BP 120/98 | HR 67 | Temp 97.9°F | Ht 65.0 in | Wt 143.0 lb

## 2014-01-11 DIAGNOSIS — R319 Hematuria, unspecified: Secondary | ICD-10-CM

## 2014-01-11 DIAGNOSIS — G8929 Other chronic pain: Secondary | ICD-10-CM | POA: Diagnosis not present

## 2014-01-11 DIAGNOSIS — R1011 Right upper quadrant pain: Secondary | ICD-10-CM

## 2014-01-11 LAB — POCT URINALYSIS DIPSTICK
Bilirubin, UA: NEGATIVE
GLUCOSE UA: NEGATIVE
Ketones, UA: NEGATIVE
Leukocytes, UA: NEGATIVE
Nitrite, UA: NEGATIVE
Protein, UA: NEGATIVE
SPEC GRAV UA: 1.02
Urobilinogen, UA: 0.2
pH, UA: 6

## 2014-01-11 MED ORDER — OMEPRAZOLE 20 MG PO CPDR: 20.0000 mg | DELAYED_RELEASE_CAPSULE | Freq: Every day | ORAL | Status: DC

## 2014-01-11 MED ORDER — GI COCKTAIL ~~LOC~~
30.0000 mL | Freq: Once | ORAL | Status: AC
  Administered 2014-01-11: 30 mL via ORAL

## 2014-01-11 NOTE — Addendum Note (Signed)
Addended by: Ewing Schlein on: 01/11/2014 04:04 PM   Modules accepted: Orders

## 2014-01-11 NOTE — Progress Notes (Signed)
  Subjective:     Rachel Nichols is a 80 y.o. female who presents for evaluation of abdominal pain. Onset was a few weeks ago. Symptoms have been unchanged. The pain is described as sharp, and is 8/10 in intensity. Pain is located in the RUQ and costovertebral angle on the right with radiation to right back.  Aggravating factors: activity and movement.  Alleviating factors: GI cocktail given in office. Associated symptoms: belching. The patient denies anorexia, arthralagias, chills, constipation, diarrhea, dysuria, fever, flatus, frequency, headache, hematochezia, hematuria, melena, myalgias, nausea, sweats and vomiting.  The patient's history has been marked as reviewed and updated as appropriate.  Review of Systems Pertinent items are noted in HPI.     Objective:    BP 120/98  Pulse 67  Temp(Src) 97.9 F (36.6 C) (Oral)  Ht 5\' 5"  (1.651 m)  Wt 143 lb (64.864 kg)  BMI 23.80 kg/m2  SpO2 97% General appearance: alert, cooperative, appears stated age and no distress Throat: lips, mucosa, and tongue normal; teeth and gums normal Neck: no adenopathy, supple, symmetrical, trachea midline and thyroid not enlarged, symmetric, no tenderness/mass/nodules Lungs: clear to auscultation bilaterally Heart: S1, S2 normal Abdomen: abnormal findings:  moderate tenderness in the RUQ and midepigastric area Extremities: extremities normal, atraumatic, no cyanosis or edema    Assessment:    Abdominal pain, ? Etiology---some relief with GI cocktail .    Plan:    The diagnosis was discussed with the patient and evaluation and treatment plans outlined. See orders for lab and imaging studies. Adhere to simple, bland diet. Initiate empiric trial of acid suppression; see orders. Further follow-up plans will be based on outcome of lab/imaging studies; see orders. Follow up as needed. consider GI

## 2014-01-11 NOTE — Progress Notes (Signed)
Pre visit review using our clinic review tool, if applicable. No additional management support is needed unless otherwise documented below in the visit note. 

## 2014-01-11 NOTE — Addendum Note (Signed)
Addended by: Ewing Schlein on: 01/11/2014 04:30 PM   Modules accepted: Orders

## 2014-01-11 NOTE — Patient Instructions (Signed)

## 2014-01-12 LAB — BASIC METABOLIC PANEL
BUN: 21 mg/dL (ref 6–23)
CALCIUM: 9.9 mg/dL (ref 8.4–10.5)
CO2: 24 mEq/L (ref 19–32)
Chloride: 104 mEq/L (ref 96–112)
Creatinine, Ser: 0.8 mg/dL (ref 0.4–1.2)
GFR: 69.22 mL/min (ref >60.00)
GLUCOSE: 103 mg/dL — AB (ref 70–99)
Potassium: 4.1 mEq/L (ref 3.5–5.1)
SODIUM: 135 meq/L (ref 135–145)

## 2014-01-12 LAB — CBC WITH DIFFERENTIAL/PLATELET
Basophils Absolute: 0 10*3/uL (ref 0.0–0.1)
Basophils Relative: 0.3 % (ref 0.0–3.0)
Eosinophils Absolute: 0.1 10*3/uL (ref 0.0–0.7)
Eosinophils Relative: 1.1 % (ref 0.0–5.0)
HCT: 40.3 % (ref 36.0–46.0)
Hemoglobin: 13.8 g/dL (ref 12.0–15.0)
Lymphocytes Relative: 27.9 % (ref 12.0–46.0)
Lymphs Abs: 2.2 10*3/uL (ref 0.7–4.0)
MCHC: 34.2 g/dL (ref 30.0–36.0)
MCV: 94.2 fl (ref 78.0–100.0)
MONOS PCT: 9.5 % (ref 3.0–12.0)
Monocytes Absolute: 0.7 10*3/uL (ref 0.1–1.0)
Neutro Abs: 4.8 10*3/uL (ref 1.4–7.7)
Neutrophils Relative %: 61.2 % (ref 43.0–77.0)
Platelets: 182 10*3/uL (ref 150.0–400.0)
RBC: 4.27 Mil/uL (ref 3.87–5.11)
RDW: 13.5 % (ref 11.5–14.6)
WBC: 7.8 10*3/uL (ref 4.5–10.5)

## 2014-01-12 LAB — HEPATIC FUNCTION PANEL
ALT: 31 U/L (ref 0–35)
AST: 24 U/L (ref 0–37)
Albumin: 3.9 g/dL (ref 3.5–5.2)
Alkaline Phosphatase: 43 U/L (ref 39–117)
BILIRUBIN TOTAL: 0.3 mg/dL (ref 0.3–1.2)
Bilirubin, Direct: 0.1 mg/dL (ref 0.0–0.3)
Total Protein: 7 g/dL (ref 6.0–8.3)

## 2014-01-12 LAB — H. PYLORI ANTIBODY, IGG: H Pylori IgG: POSITIVE — AB

## 2014-01-12 LAB — URINE CULTURE
Colony Count: NO GROWTH
ORGANISM ID, BACTERIA: NO GROWTH

## 2014-01-12 LAB — LIPASE: Lipase: 28 U/L (ref 11.0–59.0)

## 2014-01-12 LAB — AMYLASE: Amylase: 98 U/L (ref 27–131)

## 2014-01-13 ENCOUNTER — Telehealth: Payer: Self-pay

## 2014-01-13 ENCOUNTER — Ambulatory Visit (INDEPENDENT_AMBULATORY_CARE_PROVIDER_SITE_OTHER)
Admission: RE | Admit: 2014-01-13 | Discharge: 2014-01-13 | Disposition: A | Discharge status: Home / Self Care | Payer: Medicare Other | Source: Ambulatory Visit | Attending: Family Medicine | Admitting: Family Medicine

## 2014-01-13 ENCOUNTER — Other Ambulatory Visit: Payer: Self-pay

## 2014-01-13 DIAGNOSIS — G8929 Other chronic pain: Secondary | ICD-10-CM | POA: Diagnosis not present

## 2014-01-13 DIAGNOSIS — K863 Pseudocyst of pancreas: Secondary | ICD-10-CM | POA: Diagnosis not present

## 2014-01-13 DIAGNOSIS — K862 Cyst of pancreas: Secondary | ICD-10-CM | POA: Diagnosis not present

## 2014-01-13 DIAGNOSIS — A048 Other specified bacterial intestinal infections: Secondary | ICD-10-CM

## 2014-01-13 DIAGNOSIS — R1011 Right upper quadrant pain: Secondary | ICD-10-CM

## 2014-01-13 DIAGNOSIS — K7689 Other specified diseases of liver: Secondary | ICD-10-CM | POA: Diagnosis not present

## 2014-01-13 MED ORDER — AMOXICILL-CLARITHRO-LANSOPRAZ PO MISC: ORAL | Status: DC

## 2014-01-13 MED ORDER — EZETIMIBE 10 MG PO TABS: 10.0000 mg | ORAL_TABLET | Freq: Every day | ORAL | Status: DC

## 2014-01-13 MED ORDER — IOHEXOL 300 MG/ML  SOLN
80.0000 mL | Freq: Once | INTRAMUSCULAR | Status: AC | PRN
  Administered 2014-01-13: 80 mL via INTRAVENOUS

## 2014-01-13 NOTE — Telephone Encounter (Signed)
Patient have been made aware and voiced understanding. Rx sent and copy of labs mailed. Ref put in.      KP

## 2014-01-13 NOTE — Telephone Encounter (Signed)
Rx was sent to pharmacy electronically. 

## 2014-01-13 NOTE — Telephone Encounter (Signed)
Message copied by Ewing Schlein on Wed Jan 13, 2014  4:53 PM ------      Message from: Rosalita Chessman      Created: Tue Jan 12, 2014  4:57 PM       + h pylori--- hold ppi--- prevpac x2   And refer to gi ------

## 2014-01-14 ENCOUNTER — Other Ambulatory Visit: Payer: Self-pay | Admitting: *Deleted

## 2014-01-14 ENCOUNTER — Telehealth: Payer: Self-pay | Admitting: *Deleted

## 2014-01-14 NOTE — Telephone Encounter (Signed)
Spoke with patient who states Prev-Pak is over $500.00 and she is unable to afford that. She advised the pharmacist told her they could dispense med separately and that would be affordable to the patient. Called [facility] on [address] and left a detailed message for the pharmacist to dispense each medication individually. Amoxicillin 500 mg (2tabs BID), Claithromycin 500 mg (1 tab BID) and Lansoprazole 30 mg ( 1 tab BID). Left patietn a detailed message to make aware Rx had been sent.

## 2014-01-15 ENCOUNTER — Other Ambulatory Visit: Payer: Self-pay | Admitting: Family Medicine

## 2014-01-15 DIAGNOSIS — A048 Other specified bacterial intestinal infections: Secondary | ICD-10-CM

## 2014-01-15 MED ORDER — AMOXICILLIN 500 MG PO CAPS: ORAL_CAPSULE | ORAL | Status: DC

## 2014-01-15 MED ORDER — LANSOPRAZOLE 30 MG PO CPDR: DELAYED_RELEASE_CAPSULE | ORAL | Status: DC

## 2014-01-15 MED ORDER — CLARITHROMYCIN 500 MG PO TABS: 500.0000 mg | ORAL_TABLET | Freq: Two times a day (BID) | ORAL | Status: DC

## 2014-01-19 ENCOUNTER — Encounter: Payer: Self-pay | Admitting: Family Medicine

## 2014-01-22 ENCOUNTER — Encounter: Payer: Self-pay | Admitting: Gastroenterology

## 2014-01-22 ENCOUNTER — Telehealth: Payer: Self-pay | Admitting: *Deleted

## 2014-01-22 ENCOUNTER — Ambulatory Visit (INDEPENDENT_AMBULATORY_CARE_PROVIDER_SITE_OTHER): Payer: Medicare Other | Admitting: Gastroenterology

## 2014-01-22 VITALS — BP 126/70 | HR 60 | Ht 63.5 in | Wt 142.4 lb

## 2014-01-22 DIAGNOSIS — R1011 Right upper quadrant pain: Secondary | ICD-10-CM | POA: Insufficient documentation

## 2014-01-22 DIAGNOSIS — K863 Pseudocyst of pancreas: Secondary | ICD-10-CM

## 2014-01-22 DIAGNOSIS — K862 Cyst of pancreas: Secondary | ICD-10-CM

## 2014-01-22 NOTE — Patient Instructions (Signed)
Per Alonza Bogus, finish your H Pylori treatment.  Continue to take Omeprazole daily

## 2014-01-22 NOTE — Telephone Encounter (Signed)
Patient aware of recommendations. Alonza Bogus, PA sending a note to Dr. Etter Sjogren. Note in EPIC.

## 2014-01-22 NOTE — Telephone Encounter (Signed)
Rachel Nichols,   This lady was found to have a pancreatic cyst on recent CT scan. Dr. Ardis Hughs looked at the report and images and recommended the following:   "3cm cyst, incidental in 80 yo woman on CT scan. Radiology recommended 3 month follow up CT however AGA cyst surveillance guidelines are MRI based and I recommend that she have MRI instead (3 month interval is fine). If no concerning features (send a copy to me at that time), then repeat MRI in 1 year is reasonable follow up"   Please set up a reminder to have her scheduled for an MRI of the abdomen with contrast in 3 months and make the patient aware of the recommendations.   Thank you,   Jess

## 2014-01-22 NOTE — Progress Notes (Addendum)
01/22/2014 Rachel Nichols [id number] 11/10/1930   History of Present Illness:  This is a pleasant 80 year old female who is previously known to Dr. Sharlett Iles. She presents to our office today with her daughter for complaints of right upper quadrant abdominal pain. This pain began suddenly approximately 4 weeks ago and initially was severe as a 10 out of 10 on the pain scale. She denies any other associated GI symptoms such as nausea, vomiting, fevers, etc.  Appetite is good.  Pain was not affected by eating.  Her only other complaint was of fatigue.  Initially she thought she was developing shingles.  She is status post cholecystectomy.  Recent amylase, lipase, hepatic function panel, BMP, and CBC were within normal limits. Urinalysis was negative. H. pylori IgG antibody was positive and she has been started on treatment for that with Prevpac. She only has a few days left of that and has had great improvement in her symptoms. Her pain is now about a 2/10 on the pain scale and has been at that level for about the last 3 days.  She actually had an EGD with Dr. Sharlett Iles in January 2011 at which time she was found to have duodenitis in the bulb of the duodenum as well as moderate gastritis. Biopsies of the stomach revealed chronic active gastritis with H. pylori at that time, but she states that she was never informed about this or treated.  During her recent evaluation of this abdominal pain she had a CT scan of the abdomen and pelvis with contrast. That showed no acute findings, but did note multiple liver cysts, mild increased stool in the colon mostly in the right colon with no associated bowel wall thickening or inflammatory changes, and a 3 cm cyst arising from the tail of the pancreas that was described as a simple cyst by CT characteristics but a cystic neoplasm remains a possibility and they recommended followup CT be performed without contrast in 3 months.  She says that she has always had issues with  constipation but is extremely careful to prevent herself from becoming constipated. She did take a laxative recently and has had several large bowel movements.  Last colonoscopy was in May 2007 at which time she was only found to have diverticulosis.   Current Medications, Allergies, Past Medical History, Past Surgical History, Family History and Social History were reviewed in [employer] record.   Physical Exam: BP 126/70  Pulse 60  Ht 5' 3.5" (1.613 m)  Wt 142 lb 6.4 oz (64.592 kg)  BMI 24.83 kg/m2 General: Well developed white female in no acute distress Head: Normocephalic and atraumatic Eyes:  Sclerae anicteric, conjunctiva pink  Ears: Normal auditory acuity Lungs: Clear throughout to auscultation Heart: Regular rate and rhythm Abdomen: Soft, non-distended.  Normal bowel sounds.  Non-tender.  Cholecystectomy scar noted in RUQ. Musculoskeletal: Symmetrical with no gross deformities  Extremities: No edema  Neurological: Alert oriented x 4, grossly non-focal Psychological:  Alert and cooperative. Normal mood and affect  Assessment and Recommendations: -RUQ abdominal pain:  Now much improved with use of laxative and treatment of Hpylori.   Unsure of the definite source of pain, but would  complete the H pylori treatment and remain on once daily PPI thereafter until follow-up visit.  Will need future Hpylori stool antigen as well to confirm eradication. -3 cm cyst in the tail of the pancreas:  Seen on recent CT scan.  Will have Dr. Ardis Hughs review images to see what he recommends  for follow-up.   Addendum: Reviewed and agree with initial management. Jerene Bears, MD

## 2014-01-26 ENCOUNTER — Telehealth: Payer: Self-pay | Admitting: General Practice

## 2014-01-26 NOTE — Telephone Encounter (Signed)
Patient called to return your phone call in regards to statement below. Please advise.

## 2014-01-26 NOTE — Telephone Encounter (Signed)
Called pt and lmovm to return call in regards to her recent press ganey survey. Had some questions in regards to her recent telephone encounters.

## 2014-01-26 NOTE — Telephone Encounter (Signed)
Was trying to reach pt for RIE, disregard.

## 2014-03-25 DIAGNOSIS — H109 Unspecified conjunctivitis: Secondary | ICD-10-CM | POA: Diagnosis not present

## 2014-04-07 ENCOUNTER — Telehealth: Payer: Self-pay

## 2014-04-07 DIAGNOSIS — K862 Cyst of pancreas: Secondary | ICD-10-CM

## 2014-04-07 NOTE — Telephone Encounter (Signed)
Message copied by Barron Alvine on Wed Apr 07, 2014  8:22 AM ------      Message from: Hulan Saas      Created: Fri Jan 22, 2014  4:35 PM       Needs MRI with contrast for Rachel Nichols. F/u pancreatic cyst. See telephone note on 01/22/14. ------

## 2014-04-07 NOTE — Telephone Encounter (Signed)
Left message on machine to call back  

## 2014-04-08 NOTE — Telephone Encounter (Signed)
You have been scheduled for an MRI at  [facility] on 04/22/14. Your appointment time is 8 am. Please arrive 15 minutes prior to your appointment time for registration purposes. Please make certain not to have anything to eat or drink 6 hours prior to your test. In addition, if you have any metal in your body, have a pacemaker or defibrillator, please be sure to let your ordering physician know. This test typically takes 45 minutes to 1 hour to complete.  Pt has been notified

## 2014-04-22 ENCOUNTER — Ambulatory Visit (HOSPITAL_COMMUNITY)
Admission: RE | Admit: 2014-04-22 | Discharge: 2014-04-22 | Disposition: A | Discharge status: Home / Self Care | Payer: Medicare Other | Source: Ambulatory Visit | Attending: Gastroenterology | Admitting: Gastroenterology

## 2014-04-22 ENCOUNTER — Other Ambulatory Visit: Payer: Self-pay | Admitting: Gastroenterology

## 2014-04-22 DIAGNOSIS — K862 Cyst of pancreas: Secondary | ICD-10-CM | POA: Diagnosis not present

## 2014-04-22 DIAGNOSIS — K838 Other specified diseases of biliary tract: Secondary | ICD-10-CM | POA: Insufficient documentation

## 2014-04-22 DIAGNOSIS — K7689 Other specified diseases of liver: Secondary | ICD-10-CM | POA: Insufficient documentation

## 2014-04-22 DIAGNOSIS — K8689 Other specified diseases of pancreas: Secondary | ICD-10-CM | POA: Diagnosis not present

## 2014-04-22 DIAGNOSIS — M47817 Spondylosis without myelopathy or radiculopathy, lumbosacral region: Secondary | ICD-10-CM | POA: Diagnosis not present

## 2014-04-22 DIAGNOSIS — R109 Unspecified abdominal pain: Secondary | ICD-10-CM | POA: Diagnosis not present

## 2014-04-22 DIAGNOSIS — K863 Pseudocyst of pancreas: Secondary | ICD-10-CM

## 2014-04-22 LAB — POCT I-STAT CREATININE: CREATININE: 0.9 mg/dL (ref 0.50–1.10)

## 2014-04-22 MED ORDER — GADOBENATE DIMEGLUMINE 529 MG/ML IV SOLN
13.0000 mL | Freq: Once | INTRAVENOUS | Status: AC | PRN
  Administered 2014-04-22: 13 mL via INTRAVENOUS

## 2014-05-24 ENCOUNTER — Telehealth: Payer: Self-pay | Admitting: Family Medicine

## 2014-05-24 NOTE — Telephone Encounter (Signed)
Caller name: Stephani  Relation to pt: self  Call back number: [phone]   Reason for call: pt requesting a referral to Marletta Lor  MD due to the fact he's office is closer. Pt stated the new office is to far, i did explain its 2.48miles away from the old office she insisted she did not want to leave the practice but she needed a closer office. Please advise

## 2014-07-09 ENCOUNTER — Telehealth: Payer: Self-pay | Admitting: Cardiovascular Disease

## 2014-07-09 MED ORDER — ATENOLOL 25 MG PO TABS: 12.5000 mg | ORAL_TABLET | Freq: Every day | ORAL | Status: DC

## 2014-07-09 NOTE — Telephone Encounter (Signed)
Pt called in needing a refill on her Atenolol sent to [facility]   Thanks

## 2014-07-09 NOTE — Telephone Encounter (Signed)
Rx was sent to pharmacy electronically. 

## 2014-08-11 DIAGNOSIS — D239 Other benign neoplasm of skin, unspecified: Secondary | ICD-10-CM | POA: Diagnosis not present

## 2014-08-11 DIAGNOSIS — L821 Other seborrheic keratosis: Secondary | ICD-10-CM | POA: Diagnosis not present

## 2014-09-10 ENCOUNTER — Other Ambulatory Visit: Payer: Self-pay | Admitting: Cardiovascular Disease

## 2014-09-10 NOTE — Telephone Encounter (Signed)
Rx was sent to pharmacy electronically. 

## 2014-10-11 DIAGNOSIS — Z961 Presence of intraocular lens: Secondary | ICD-10-CM | POA: Diagnosis not present

## 2014-11-11 ENCOUNTER — Other Ambulatory Visit: Payer: Self-pay | Admitting: Cardiovascular Disease

## 2014-11-12 NOTE — Telephone Encounter (Signed)
Rx(s) sent to pharmacy electronically. Message sent to Dr. Kelly's scheduler to contact patient for appointment 

## 2014-11-16 ENCOUNTER — Telehealth: Payer: Self-pay | Admitting: Cardiovascular Disease

## 2014-11-17 NOTE — Telephone Encounter (Signed)
Closed encounter °

## 2015-01-04 DIAGNOSIS — E559 Vitamin D deficiency, unspecified: Secondary | ICD-10-CM | POA: Diagnosis not present

## 2015-01-04 DIAGNOSIS — Z803 Family history of malignant neoplasm of breast: Secondary | ICD-10-CM | POA: Diagnosis not present

## 2015-01-04 DIAGNOSIS — Z1231 Encounter for screening mammogram for malignant neoplasm of breast: Secondary | ICD-10-CM | POA: Diagnosis not present

## 2015-01-12 ENCOUNTER — Ambulatory Visit (INDEPENDENT_AMBULATORY_CARE_PROVIDER_SITE_OTHER): Payer: Medicare Other | Admitting: Cardiovascular Disease

## 2015-01-12 ENCOUNTER — Encounter: Payer: Self-pay | Admitting: Cardiovascular Disease

## 2015-01-12 VITALS — BP 100/56 | HR 59 | Ht 64.0 in | Wt 145.7 lb

## 2015-01-12 DIAGNOSIS — I2510 Atherosclerotic heart disease of native coronary artery without angina pectoris: Secondary | ICD-10-CM | POA: Diagnosis not present

## 2015-01-12 DIAGNOSIS — Z79899 Other long term (current) drug therapy: Secondary | ICD-10-CM | POA: Diagnosis not present

## 2015-01-12 DIAGNOSIS — I10 Essential (primary) hypertension: Secondary | ICD-10-CM

## 2015-01-12 DIAGNOSIS — R011 Cardiac murmur, unspecified: Secondary | ICD-10-CM

## 2015-01-12 DIAGNOSIS — E785 Hyperlipidemia, unspecified: Secondary | ICD-10-CM

## 2015-01-12 DIAGNOSIS — I2583 Coronary atherosclerosis due to lipid rich plaque: Secondary | ICD-10-CM

## 2015-01-12 NOTE — Patient Instructions (Signed)
Your physician recommends that you return for lab work FASTING  Your physician has requested that you have an echocardiogram. Echocardiography is a painless test that uses sound waves to create images of your heart. It provides your doctor with information about the size and shape of your heart and how well your heart's chambers and valves are working. This procedure takes approximately one hour. There are no restrictions for this procedure. In [place] physician recommends that you schedule a follow-up appointment in: 6 months

## 2015-01-13 ENCOUNTER — Encounter: Payer: Self-pay | Admitting: Cardiovascular Disease

## 2015-01-13 ENCOUNTER — Other Ambulatory Visit: Payer: Self-pay | Admitting: Cardiovascular Disease

## 2015-01-13 DIAGNOSIS — R011 Cardiac murmur, unspecified: Secondary | ICD-10-CM | POA: Insufficient documentation

## 2015-01-13 DIAGNOSIS — E785 Hyperlipidemia, unspecified: Secondary | ICD-10-CM | POA: Insufficient documentation

## 2015-01-13 NOTE — Telephone Encounter (Signed)
Rx(s) sent to pharmacy electronically.  

## 2015-01-13 NOTE — Progress Notes (Signed)
Patient ID: Rachel Nichols, female   DOB: Dec 06, 1930, 81 y.o.   MRN: [id number]       HPI: Rachel Nichols, is a 81 y.o. female who presents to the office for a 39 month cardiology evaluation.  Rachel Nichols is  suffered a myocardial infarction in January 1993 and underwent PTCA of her RCA. In April 2012, a 2.5x12 mm Resolute DES stent was inserted to the proximal portion of a ramus intermediate vessel. She  had 60% LAD stenosis after diagonal vessel as well as 40-50% mid right coronary artery stenosis for which she's been treated medically. Her last myocardial perfusion study in May 2013  remained normal. She has continued to remain stable on her current medical regimen. We had increased her Crestor to 10 mg to take in addition to this area there LDL particle concentration was still elevated at 1387. Subsequent blood work done on 11/10/2012 showed marked improvement with her LDL now at 61 and LDL particle number at 1027 the total cholesterol was 126, HDL 43, triglycerides 108. Her insulin resistance score was still slightly elevated at 56.   since I last saw her in October 2014, she has continued to do well. She denies any episodes of recurrent anginal type symptoms.  She denies PND, orthopnea.  She denies palpitations. She has been taking atenolol 12.5 mg daily.  She has been on Zetia and Crestor for lipid management.  She presents for evaluation.  Past Medical History  Diagnosis Date  . Hyperlipidemia   . Basal cell carcinoma     Nose  . Heart attack 1993    Cardiolyte stress---  stents 2012  . Diverticulosis 2002,2004,2007    Colonoscopy  . Benign neoplasm of colon 2004    Colonoscopy  . PUD (peptic ulcer disease)   . GERD (gastroesophageal reflux disease)   . Hiatal hernia   . Gastritis   . Duodenitis   . History of blood transfusion     pt denies  . CAD (coronary artery disease)     echo 09/13/10- EF>55%, aortic valve is mildly sclerotic; myoview 02-20-12-no ischemia  . Fibrocystic breast  disease   . Adenomatous colon polyp 2002    Colonoscopy   . Carotid bruit     carotid dopplers 05/16/04- normal  . Peptic ulcer due to Helicobacter pylori     Past Surgical History  Procedure Laterality Date  . Basal cell carcinoma excision      From the nose  . Cataract extraction  10/26/09    Right eye  . Cataract extraction  11/09/09    Left eye  . Finger surgery  04/2011    Cyst removal, right hand forefinger  . Coronary stent placement  01/10/11    hig grade ramus branch stenosis with mod LAD dz, PCI/stenting of ramus branch with a Resolute DES   . Cholecystectomy  1984  . Angioplasty  1993  . Dilation and curettage of uterus    . Left oophorectomy  1957  . Appendectomy  1957    Allergies  Allergen Reactions  . Diltiazem Hcl   . Tape Other (See Comments)    It pulls the skin off.  . Tramadol Hcl     Current Outpatient Prescriptions  Medication Sig Dispense Refill  . aspirin 325 MG tablet Take 325 mg by mouth daily.    Marland Kitchen atenolol (TENORMIN) 25 MG tablet Take 0.5 tablets (12.5 mg total) by mouth daily. 45 tablet 3  . cholecalciferol (VITAMIN D) 1000 UNITS  tablet Take 2,000 Units by mouth daily.    . Coenzyme Q10 (CO Q10) 100 MG CAPS Take 200 mg by mouth daily.    . Cyanocobalamin (VITAMIN B 12 PO) Take 500 mg by mouth daily.    Marland Kitchen ezetimibe (ZETIA) 10 MG tablet Take 1 tablet (10 mg total) by mouth daily. 90 tablet 2  . magnesium oxide (MAG-OX) 400 MG tablet Take 400 mg by mouth daily.    . minocycline (MINOCIN,DYNACIN) 100 MG capsule Take 1 capsule by mouth 2 (two) times a week.    . Multiple Vitamins-Minerals (CENTRUM SILVER ULTRA WOMENS) TABS Take 1 tablet by mouth daily.    . potassium phosphate, monobasic, (K-PHOS ORIGINAL) 500 MG tablet Take 500 mg by mouth daily.    . rosuvastatin (CRESTOR) 10 MG tablet Take 1 tablet (10 mg total) by mouth daily. 90 tablet 2   No current facility-administered medications for this visit.   Socially she is married. Has one child who  is living and one child who is deceased. She has 3 grandchildren. There is no tobacco or alcohol use.  ROS General: Negative; No fevers, chills, or night sweats HEENT: Negative; No changes in vision or hearing, sinus congestion, difficulty swallowing Pulmonary: Negative; No cough, wheezing, shortness of breath, hemoptysis Cardiovascular: Negative; No chest pain, presyncope, syncope, palpitations GI: Negative; No nausea, vomiting, diarrhea, or abdominal pain GU: Negative; No dysuria, hematuria, or difficulty voiding Musculoskeletal: Negative; no myalgias, joint pain, or weakness Hematologic/Oncology: Negative; no easy bruising, bleeding Endocrine: Negative; no heat/cold intolerance; no diabetes Neuro: Negative; no changes in balance, headaches Skin: Negative; No rashes or skin lesions Psychiatric: Negative; No behavioral problems, depression Sleep: Negative; No snoring, daytime sleepiness, hypersomnolence, bruxism, restless legs, hypnogognic hallucinations, no cataplexy Other comprehensive 14 point system review is negative.   PE BP 100/56 mmHg  Pulse 59  Ht 5\' 4"  (1.626 m)  Wt 145 lb 11.2 oz (66.089 kg)  BMI 25.00 kg/m2  General: Alert, oriented, no distress.  HEENT: Normocephalic, atraumatic. Pupils round and reactive; sclera anicteric;  Nose without nasal septal hypertrophy Mouth/Parynx benign; Mallinpatti scale 2 Neck: No JVD, no carotid bruits Lungs: clear to ausculatation and percussion; no wheezing or rales  chest wall: Nontender to palpation Heart: RRR, s1 s2 normal 2/6 systolic murmur  In the aortic area.  No S3 gallop.  No diastolic murmur.  No rubs thrills or heaves. Abdomen: soft, nontender; no hepatosplenomehaly, BS+; abdominal aorta nontender and not dilated by palpation.  back: No CVA tenderness Pulses 2+ Extremities: no clubbinbg cyanosis or edema, Homan's sign negative  Neurologic: grossly nonfocal Psychologic: Normal affect and mood  ECG (independently read by  me): Sinus bradycardia 59 bpm.  No ectopy.  PiorECG: Sinus rhythm at 53 beats per minute. PR interval 166 ms QTc interval 426 ms.  LABS:  BMET   BMP Latest Ref Rng 04/22/2014 01/11/2014 12/28/2013  Glucose 70 - 99 mg/dL - 103(H) 95  BUN 6 - 23 mg/dL - 21 19  Creatinine 0.50 - 1.10 mg/dL 0.90 0.8 1.1  Sodium 135 - 145 mEq/L - 135 139  Potassium 3.5 - 5.1 mEq/L - 4.1 4.0  Chloride 96 - 112 mEq/L - 104 103  CO2 19 - 32 mEq/L - 24 27  Calcium 8.4 - 10.5 mg/dL - 9.9 10.0    Hepatic Function Panel     Component Value Date/Time   PROT 7.0 01/11/2014 1507   ALBUMIN 3.9 01/11/2014 1507   AST 24 01/11/2014 1507   ALT 31  01/11/2014 1507   ALKPHOS 43 01/11/2014 1507   BILITOT 0.3 01/11/2014 1507   BILIDIR 0.1 01/11/2014 1507     CBC  CBC Latest Ref Rng 01/11/2014 12/28/2013 08/13/2013  WBC 4.5 - 10.5 K/uL 7.8 6.8 5.3  Hemoglobin 12.0 - 15.0 g/dL 13.8 13.6 14.2  Hematocrit 36.0 - 46.0 % 40.3 41.2 42.0  Platelets 150.0 - 400.0 K/uL 182.0 183.0 192.0     BNP    Component Value Date/Time   PROBNP 86.0 01/10/2011 1357    Lipid Panel     Component Value Date/Time   CHOL 140 08/13/2013 0914   TRIG 142.0 08/13/2013 0914   HDL 46.40 08/13/2013 0914   CHOLHDL 3 08/13/2013 0914   VLDL 28.4 08/13/2013 0914   LDLCALC 65 08/13/2013 0914     RADIOLOGY: No results found.    ASSESSMENT AND PLAN: Ms. Haylo Fake 81 year old female who  is 23 years following her myocardial infarction and PTCA of her right coronary artery. She is 4 years status post stenting of an 80% stenosis in the proximal portion of a ramus intermediate vessel treated with a resolute DES stent.  Her blood pressure today is stable on her current therapy.  Her resting heart rate is 59 on low-dose atenolol.  She continues to be on  Lipid-lowering therapy with combination Crestor and Zetia and is tolerating this well without myalgias.  She has been taking aspirin 325 mg I have recommended she reduce this to 81 mg.  She does  have a more pronounced cardiac murmur on exam.  I'm scheduling her for follow-up echo Doppler study to be obtained in 5 months and I will see her back in 6 months for reevaluation.  A complete set of laboratory will be checked and the fasting state and adjustments will made if necessary to her medical regimen.   Time spent: 25 minutes   Troy Sine, MD, [facility]  01/13/2015 8:21 PM

## 2015-01-17 DIAGNOSIS — Z79899 Other long term (current) drug therapy: Secondary | ICD-10-CM | POA: Diagnosis not present

## 2015-01-18 LAB — COMPREHENSIVE METABOLIC PANEL
ALK PHOS: 45 U/L (ref 39–117)
ALT: 28 U/L (ref 0–35)
AST: 27 U/L (ref 0–37)
Albumin: 3.9 g/dL (ref 3.5–5.2)
BUN: 14 mg/dL (ref 6–23)
CO2: 29 mEq/L (ref 19–32)
CREATININE: 0.81 mg/dL (ref 0.50–1.10)
Calcium: 9.9 mg/dL (ref 8.4–10.5)
Chloride: 105 mEq/L (ref 96–112)
Glucose, Bld: 96 mg/dL (ref 70–99)
Potassium: 4.4 mEq/L (ref 3.5–5.3)
Sodium: 141 mEq/L (ref 135–145)
Total Bilirubin: 0.5 mg/dL (ref 0.2–1.2)
Total Protein: 6.8 g/dL (ref 6.0–8.3)

## 2015-01-18 LAB — LIPID PANEL
CHOL/HDL RATIO: 3.5 ratio
CHOLESTEROL: 136 mg/dL (ref 0–200)
HDL: 39 mg/dL — ABNORMAL LOW (ref >=46)
LDL CALC: 58 mg/dL (ref 0–99)
Triglycerides: 194 mg/dL — ABNORMAL HIGH (ref <150)
VLDL: 39 mg/dL (ref 0–40)

## 2015-01-18 LAB — CBC
HCT: 42.4 % (ref 36.0–46.0)
Hemoglobin: 13.9 g/dL (ref 12.0–15.0)
MCH: 31.2 pg (ref 26.0–34.0)
MCHC: 32.8 g/dL (ref 30.0–36.0)
MCV: 95.1 fL (ref 78.0–100.0)
MPV: 11 fL (ref 8.6–12.4)
Platelets: 182 10*3/uL (ref 150–400)
RBC: 4.46 MIL/uL (ref 3.87–5.11)
RDW: 13.7 % (ref 11.5–15.5)
WBC: 5.7 10*3/uL (ref 4.0–10.5)

## 2015-02-11 ENCOUNTER — Telehealth: Payer: Self-pay | Admitting: *Deleted

## 2015-02-11 NOTE — Telephone Encounter (Signed)
-----   Message from Troy Sine, MD sent at 02/03/2015  4:37 PM EDT ----- Labs good x inc TG and low HDL. Add fish oil 2 capsules daily

## 2015-02-11 NOTE — Telephone Encounter (Signed)
Spoke with patient notifying her of lab results and recommendations. Patient admits to once taking fish oil and stopped. She also states that she is drinking lots of pepsi. Recommended that she cut back on the pepsi and restart the fish oil 2 capsules daily per Dr. Evette Georges orders. She voiced verbal understanding of these instructions.

## 2015-02-15 ENCOUNTER — Encounter: Payer: Self-pay | Admitting: Family Medicine

## 2015-02-16 ENCOUNTER — Encounter: Payer: Self-pay | Admitting: Family Medicine

## 2015-03-03 ENCOUNTER — Other Ambulatory Visit: Payer: Self-pay | Admitting: Cardiovascular Disease

## 2015-03-03 NOTE — Telephone Encounter (Signed)
Rx(s) sent to pharmacy electronically.  

## 2015-04-08 ENCOUNTER — Encounter: Payer: Self-pay | Admitting: Gastroenterology

## 2015-05-16 ENCOUNTER — Telehealth: Payer: Self-pay | Admitting: Cardiovascular Disease

## 2015-05-16 MED ORDER — ROSUVASTATIN CALCIUM 10 MG PO TABS: 10.0000 mg | ORAL_TABLET | Freq: Every day | ORAL | Status: DC

## 2015-05-16 NOTE — Telephone Encounter (Signed)
°  1. Which medications need to be refilled? Crestor   2. Which pharmacy is medication to be sent to?[facility]   3. Do they need a 30 day or 90 day supply? 90  4. Would they like a call back once the medication has been sent to the pharmacy?no

## 2015-05-16 NOTE — Telephone Encounter (Signed)
Rx(s) sent to pharmacy electronically.  

## 2015-05-24 ENCOUNTER — Telehealth: Payer: Self-pay | Admitting: Cardiovascular Disease

## 2015-05-24 NOTE — Telephone Encounter (Signed)
Pt wants to know if it is alright for her to take the generic Crestor?

## 2015-05-24 NOTE — Telephone Encounter (Signed)
INFORMED PATIENT IT WILL BE OKAY TO USE GENERIC CRESTOR PATIENT VERBALIZED UNDERSTANDING.

## 2015-06-24 DIAGNOSIS — N390 Urinary tract infection, site not specified: Secondary | ICD-10-CM | POA: Diagnosis not present

## 2015-06-24 DIAGNOSIS — Z23 Encounter for immunization: Secondary | ICD-10-CM | POA: Diagnosis not present

## 2015-06-27 ENCOUNTER — Other Ambulatory Visit: Payer: Self-pay

## 2015-06-27 ENCOUNTER — Other Ambulatory Visit: Payer: Self-pay | Admitting: Cardiovascular Disease

## 2015-06-27 ENCOUNTER — Ambulatory Visit (HOSPITAL_COMMUNITY): Payer: Medicare Other | Attending: Cardiology

## 2015-06-27 DIAGNOSIS — R011 Cardiac murmur, unspecified: Secondary | ICD-10-CM | POA: Insufficient documentation

## 2015-06-27 DIAGNOSIS — E785 Hyperlipidemia, unspecified: Secondary | ICD-10-CM | POA: Diagnosis not present

## 2015-06-27 DIAGNOSIS — I10 Essential (primary) hypertension: Secondary | ICD-10-CM | POA: Diagnosis not present

## 2015-06-27 DIAGNOSIS — I517 Cardiomegaly: Secondary | ICD-10-CM | POA: Diagnosis not present

## 2015-07-01 DIAGNOSIS — M5441 Lumbago with sciatica, right side: Secondary | ICD-10-CM | POA: Diagnosis not present

## 2015-07-01 DIAGNOSIS — M7061 Trochanteric bursitis, right hip: Secondary | ICD-10-CM | POA: Diagnosis not present

## 2015-07-01 IMAGING — CT CT ABD-PELV W/ CM
2 of 5 series · 16 of 46 positions shown, 18 images · IV contrast (Omnipaque 300)
Comparison: None.

CLINICAL DATA: Abdominal pain, right upper quadrant.

EXAM:
CT ABDOMEN AND PELVIS WITH CONTRAST
TECHNIQUE: Multidetector CT imaging of the abdomen and pelvis was performed
using the standard protocol following bolus administration of
intravenous contrast.
CONTRAST:  80mL OMNIPAQUE IOHEXOL 300 MG/ML  SOLN

[Series 2: abd/ pel 5mm · axial · 0.70mm/px · z∈[-433,-48]mm · 13 of 87 slices shown, 15 images]
[im 5/87  soft-tissue]
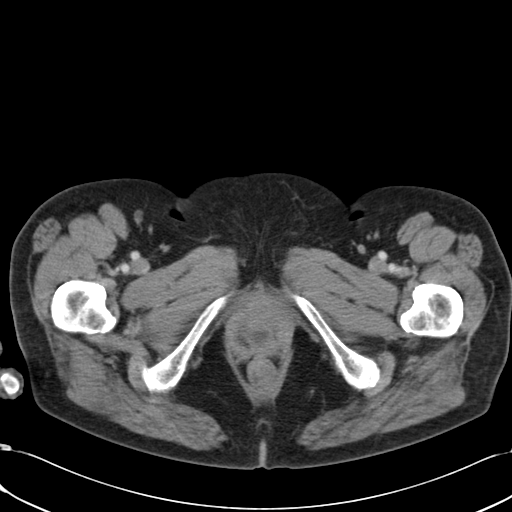
[im 5/87  bone]
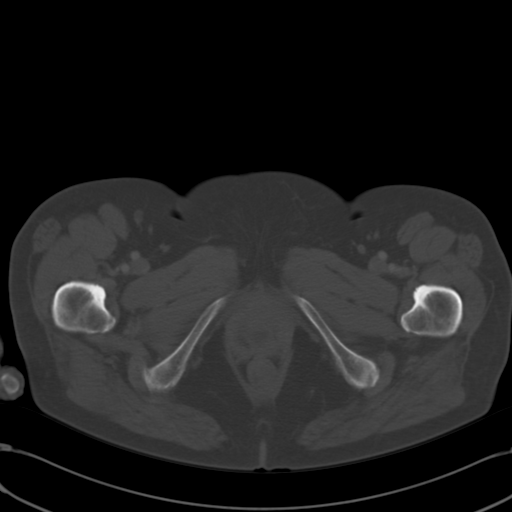
[im 14/87  soft-tissue]
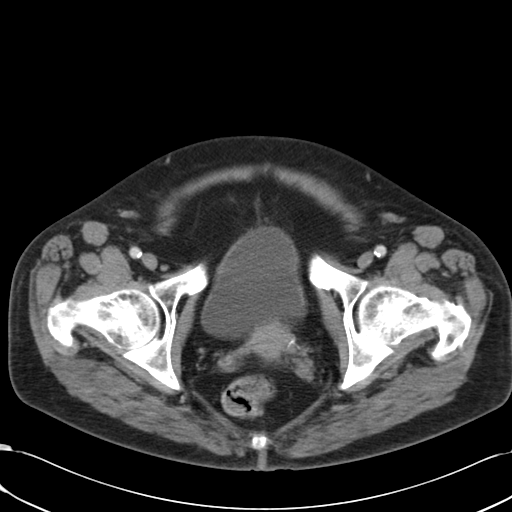
[im 19/87  soft-tissue]
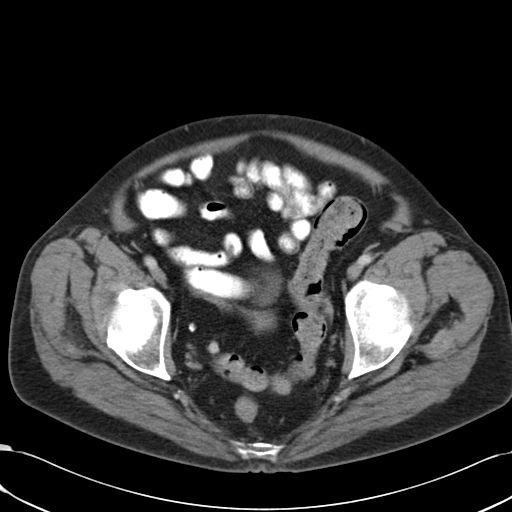
[im 23/87  soft-tissue]
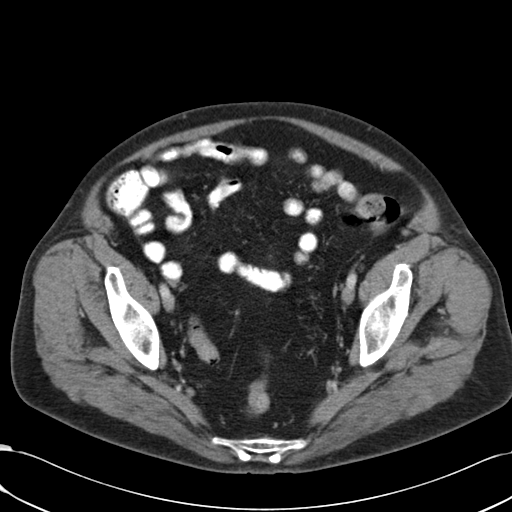
[im 32/87  soft-tissue]
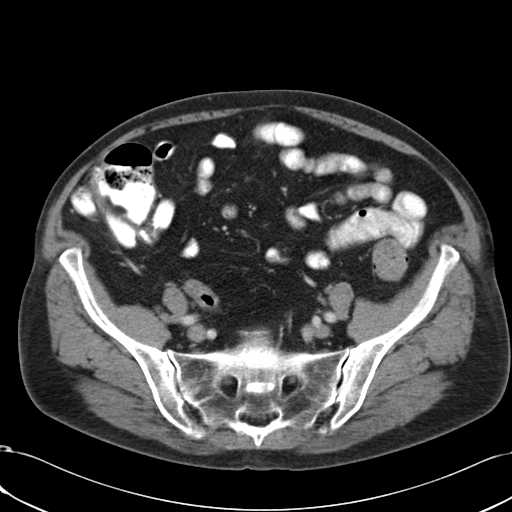
[im 37/87  soft-tissue]
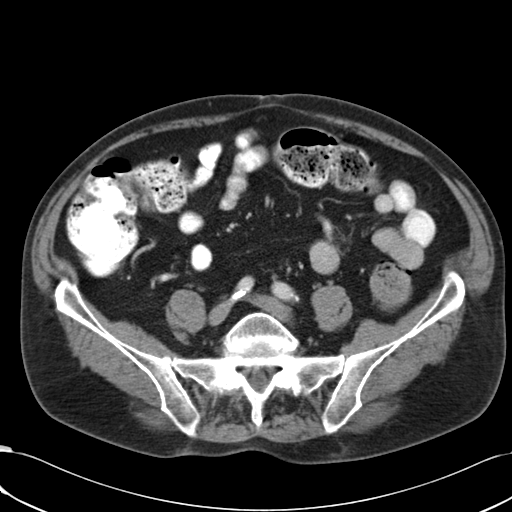
[im 46/87  soft-tissue]
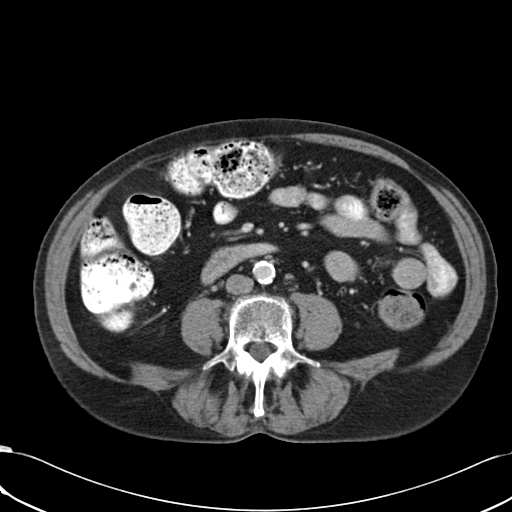
[im 50/87  soft-tissue]
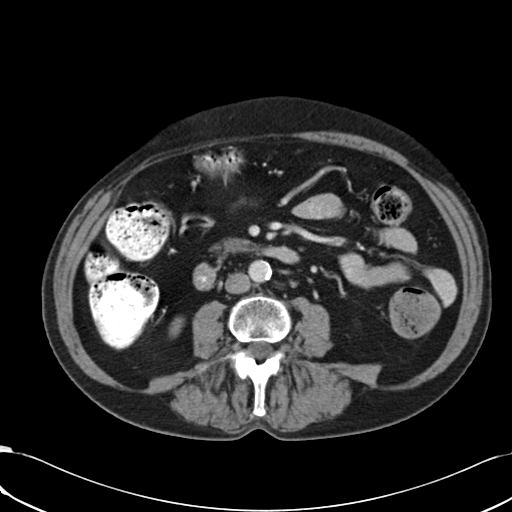
[im 55/87  soft-tissue]
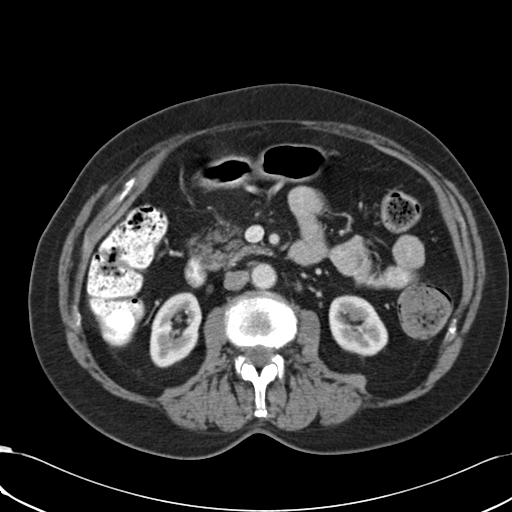
[im 55/87  bone]
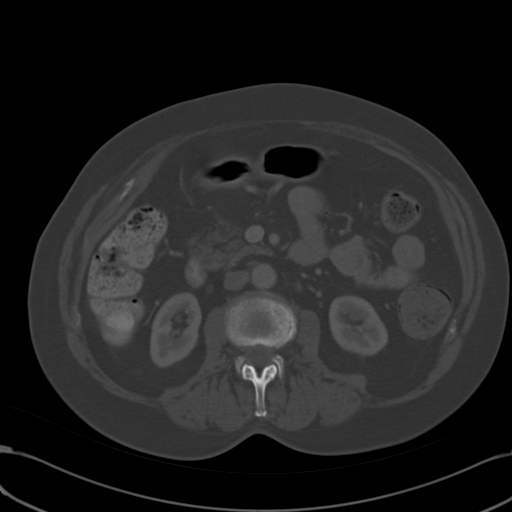
[im 64/87  soft-tissue]
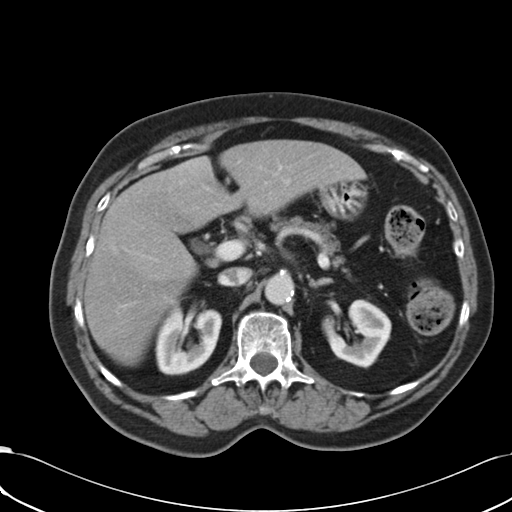
[im 68/87  soft-tissue]
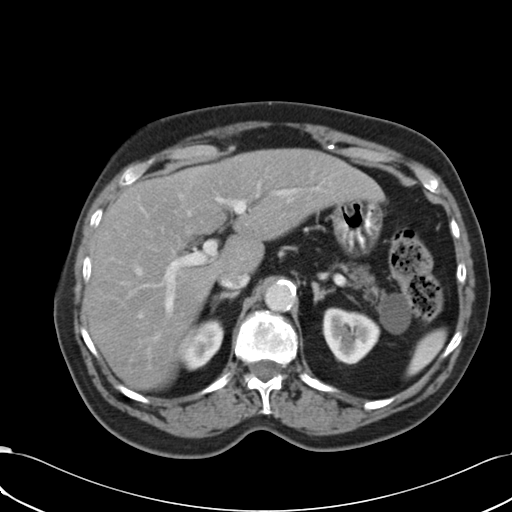
[im 73/87  soft-tissue]
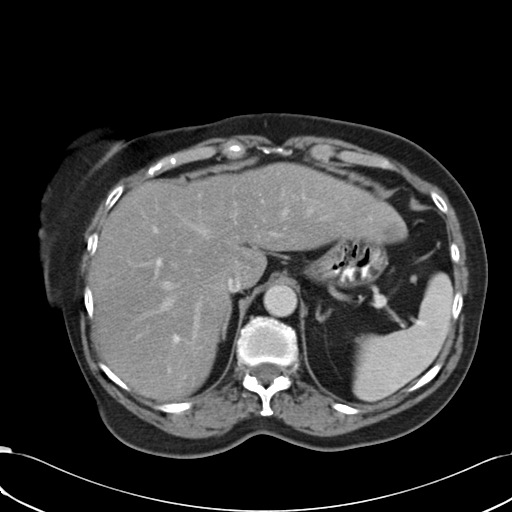
[im 82/87  soft-tissue]
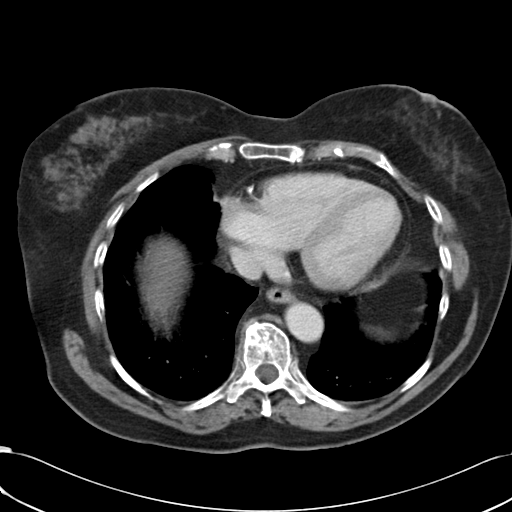

[Series 602: coronals · coronal · 0.87mm/px · 3 of 124 slices shown]
[im 42/124  soft-tissue]
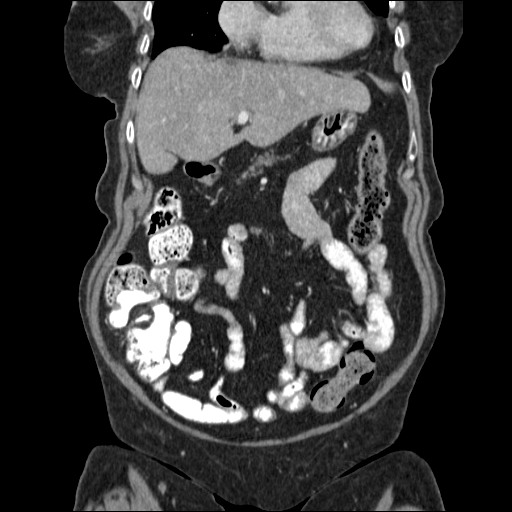
[im 55/124  soft-tissue]
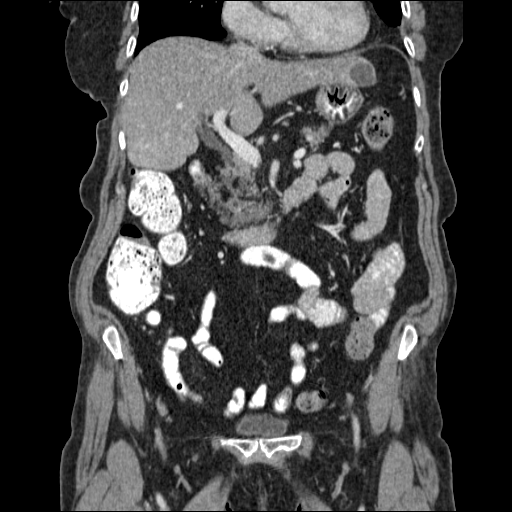
[im 69/124  soft-tissue]
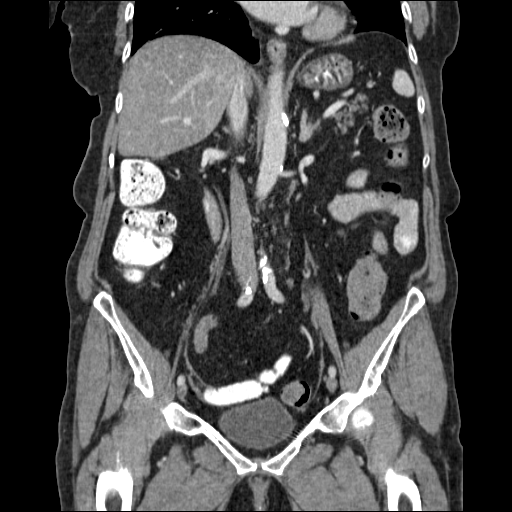

[16 of 46 positions shown; findings below may reference images not displayed]

FINDINGS: All there is reticular opacity at the lung bases likely combination
scarring and subsegmental atelectasis. The heart is normal in size.

There are multiple small low-density liver lesions, the largest
arising from the lateral segment of the left lobe. This measures
cm and have low Hounsfield units, and is without enhancement. These
lesions are consistent with cysts. Patchy fatty infiltration is
noted of the liver. No other liver abnormality.

Normal spleen. Gallbladder is surgically absent. There is mild
dilation of the common bile duct, measuring 10 mm proximally
thrombus showing distal tapering.

A mildly lobulated, homogeneous low-attenuation, 3 cm by 2.2 cm x 3
cm cyst arises from the tail the pancreas. This lesion shows no
convincing enhancement. Pancreas is otherwise unremarkable.

No adrenal masses. Normal kidneys. Normal ureters. Bladder is
unremarkable. Uterus and adnexa are unremarkable.

No pathologically enlarged lymph nodes. No abnormal fluid
collections.

Mild increased stool is noted throughout the colon most evident in
the right colon. No bowel wall thickening or inflammatory changes.
Normal small bowel. Appendix not visualized.

There are degenerative changes of the visualized spine. No
osteoblastic or osteolytic lesions.
IMPRESSION: 1. No acute findings.
2. Mild increased stool in the colon, mostly in the right colon. No
bowel wall thickening or inflammatory changes.
3. 3 cm cyst arises from the tail the pancreas. This has simple cyst
imaging characteristics by CT, but a cystic neoplasm remains
possible. This warrants follow-up evaluation. Recommend followup CT,
which could be performed without contrast, in 3 months.
4. Multiple liver cysts.
5. Other chronic findings as detailed.

## 2015-07-13 ENCOUNTER — Ambulatory Visit (INDEPENDENT_AMBULATORY_CARE_PROVIDER_SITE_OTHER): Payer: Medicare Other | Admitting: Cardiovascular Disease

## 2015-07-13 VITALS — BP 124/68 | HR 55 | Ht 64.0 in | Wt 138.7 lb

## 2015-07-13 DIAGNOSIS — I25810 Atherosclerosis of coronary artery bypass graft(s) without angina pectoris: Secondary | ICD-10-CM

## 2015-07-13 DIAGNOSIS — R011 Cardiac murmur, unspecified: Secondary | ICD-10-CM | POA: Diagnosis not present

## 2015-07-13 DIAGNOSIS — E785 Hyperlipidemia, unspecified: Secondary | ICD-10-CM | POA: Diagnosis not present

## 2015-07-13 DIAGNOSIS — I2510 Atherosclerotic heart disease of native coronary artery without angina pectoris: Secondary | ICD-10-CM

## 2015-07-13 DIAGNOSIS — I10 Essential (primary) hypertension: Secondary | ICD-10-CM | POA: Diagnosis not present

## 2015-07-13 NOTE — Patient Instructions (Signed)
Your physician wants you to follow-up in: 1 year or sooner if needed. You will receive a reminder letter in the mail two months in advance. If you don't receive a letter, please call our office to schedule the follow-up appointment.  

## 2015-07-15 ENCOUNTER — Encounter: Payer: Self-pay | Admitting: Cardiovascular Disease

## 2015-07-15 NOTE — Progress Notes (Signed)
Patient ID: Rachel Nichols, female   DOB: 04-02-1931, 81 y.o.   MRN: [id number]       HPI: Rachel Nichols, is a 81 y.o. female who presents to the office for a 6 month cardiology evaluation.  Rachel Nichols is  suffered a myocardial infarction in January 1993 and underwent PTCA of her RCA. In April 2012, a 2.5x12 mm Resolute DES stent was inserted to the proximal portion of a ramus intermediate vessel. She  had 60% LAD stenosis after diagonal vessel as well as 40-50% mid right coronary artery stenosis for which she's been treated medically. A myocardial perfusion study in May 2013  remained normal. She has continued to remain stable on her current medical regimen. We had increased her Crestor to 10 mg to take in addition to this area there LDL particle concentration was still elevated at 1387. Subsequent blood work on 11/10/2012 showed marked improvement with her LDL now at 61 and LDL particle number at 1027 the total cholesterol was 126, HDL 43, triglycerides 108. Her insulin resistance score was still slightly elevated at 56.  WhenI last saw her in April 2016 for 18 month follow-up evaluation, her cardiac murmur was more pronounced.  I scheduled her for an echo Doppler study.  This was done on 06/27/2015.  LV function was normal with an ejection fraction of 65-70%.  There was grade 1 diastolic dysfunction.  There were no wall motion abnormalities.  She had mild left atrial dilatation.  She was not found to have significant valvular pathology. Presently, she denies any episodes of recurrent anginal type symptoms.  She denies PND, orthopnea.  She denies palpitations. She has been taking atenolol 12.5 mg daily.  She has been on Zetia and Crestor for lipid management.  She presents for evaluation.  Past Medical History  Diagnosis Date  . Hyperlipidemia   . Basal cell carcinoma     Nose  . Heart attack ([place]) 1993    Cardiolyte stress---  stents 2012  . Diverticulosis 2002,2004,2007    Colonoscopy  . Benign  neoplasm of colon 2004    Colonoscopy  . PUD (peptic ulcer disease)   . GERD (gastroesophageal reflux disease)   . Hiatal hernia   . Gastritis   . Duodenitis   . History of blood transfusion     pt denies  . CAD (coronary artery disease)     echo 09/13/10- EF>55%, aortic valve is mildly sclerotic; myoview 02-20-12-no ischemia  . Fibrocystic breast disease   . Adenomatous colon polyp 2002    Colonoscopy   . Carotid bruit     carotid dopplers 05/16/04- normal  . Peptic ulcer due to Helicobacter pylori     Past Surgical History  Procedure Laterality Date  . Basal cell carcinoma excision      From the nose  . Cataract extraction  10/26/09    Right eye  . Cataract extraction  11/09/09    Left eye  . Finger surgery  04/2011    Cyst removal, right hand forefinger  . Coronary stent placement  01/10/11    hig grade ramus branch stenosis with mod LAD dz, PCI/stenting of ramus branch with a Resolute DES   . Cholecystectomy  1984  . Angioplasty  1993  . Dilation and curettage of uterus    . Left oophorectomy  1957  . Appendectomy  1957    Allergies  Allergen Reactions  . Diltiazem Hcl   . Tape Other (See Comments)    It  pulls the skin off.  . Tramadol Hcl     Current Outpatient Prescriptions  Medication Sig Dispense Refill  . aspirin 81 MG tablet Take 81 mg by mouth daily.    Marland Kitchen atenolol (TENORMIN) 25 MG tablet Take 0.5 tablets (12.5 mg total) by mouth daily. 45 tablet 3  . cholecalciferol (VITAMIN D) 1000 UNITS tablet Take 2,000 Units by mouth daily.    . Coenzyme Q10 (CO Q10) 100 MG CAPS Take 200 mg by mouth daily.    . Cyanocobalamin (VITAMIN B 12 PO) Take 500 mg by mouth daily.    Marland Kitchen ezetimibe (ZETIA) 10 MG tablet Take 1 tablet (10 mg total) by mouth daily. 90 tablet 2  . magnesium oxide (MAG-OX) 400 MG tablet Take 400 mg by mouth daily.    . minocycline (MINOCIN,DYNACIN) 100 MG capsule Take 1 capsule by mouth 2 (two) times a week.    . Multiple Vitamins-Minerals (CENTRUM SILVER  ULTRA WOMENS) TABS Take 1 tablet by mouth daily.    . rosuvastatin (CRESTOR) 10 MG tablet Take 1 tablet (10 mg total) by mouth daily. 90 tablet 1   No current facility-administered medications for this visit.   Socially she is married. Has one child who is living and one child who is deceased. She has 3 grandchildren. There is no tobacco or alcohol use.  ROS General: Negative; No fevers, chills, or night sweats HEENT: Negative; No changes in vision or hearing, sinus congestion, difficulty swallowing Pulmonary: Negative; No cough, wheezing, shortness of breath, hemoptysis Cardiovascular: Negative; No chest pain, presyncope, syncope, palpitations GI: Negative; No nausea, vomiting, diarrhea, or abdominal pain GU: Negative; No dysuria, hematuria, or difficulty voiding Musculoskeletal: Intermittent cramps in her legs and feet. Hematologic/Oncology: Negative; no easy bruising, bleeding Endocrine: Negative; no heat/cold intolerance; no diabetes Neuro: Negative; no changes in balance, headaches Skin: Negative; No rashes or skin lesions Psychiatric: Negative; No behavioral problems, depression Sleep: Negative; No snoring, daytime sleepiness, hypersomnolence, bruxism, restless legs, hypnogognic hallucinations, no cataplexy Other comprehensive 14 point system review is negative.   PE BP 124/68 mmHg  Pulse 55  Ht 5\' 4"  (1.626 m)  Wt 138 lb 11.2 oz (62.914 kg)  BMI 23.80 kg/m2   Wt Readings from Last 3 Encounters:  07/13/15 138 lb 11.2 oz (62.914 kg)  01/12/15 145 lb 11.2 oz (66.089 kg)  01/22/14 142 lb 6.4 oz (64.592 kg)   General: Alert, oriented, no distress.  HEENT: Normocephalic, atraumatic. Pupils round and reactive; sclera anicteric;  Nose without nasal septal hypertrophy Mouth/Parynx benign; Mallinpatti scale 2 Neck: No JVD, no carotid bruits with normal carotid upstroke Lungs: clear to ausculatation and percussion; no wheezing or rales  chest wall: Nontender to palpation Heart:  RRR, s1 s2 normal 2/6 systolic murmur n the aortic area.  No S3 gallop.  No diastolic murmur.  No rubs thrills or heaves. Abdomen: soft, nontender; no hepatosplenomehaly, BS+; abdominal aorta nontender and not dilated by palpation.  back: No CVA tenderness Pulses 2+ Extremities: no clubbinbg cyanosis or edema, Homan's sign negative  Neurologic: grossly nonfocal Psychologic: Normal affect and mood  ECG (independently read by me): Sinus bradycardia at 55 bpm.  No ectopy.  Normal intervals.  April 2016 ECG (independently read by me): Sinus bradycardia 59 bpm.  No ectopy.  Pior ECG: Sinus rhythm at 53 beats per minute. PR interval 166 ms QTc interval 426 ms.  LABS  BMP Latest Ref Rng 01/17/2015 04/22/2014 01/11/2014  Glucose 70 - 99 mg/dL 96 - 103(H)  BUN 6 -  23 mg/dL 14 - 21  Creatinine 0.50 - 1.10 mg/dL 0.81 0.90 0.8  Sodium 135 - 145 mEq/L 141 - 135  Potassium 3.5 - 5.3 mEq/L 4.4 - 4.1  Chloride 96 - 112 mEq/L 105 - 104  CO2 19 - 32 mEq/L 29 - 24  Calcium 8.4 - 10.5 mg/dL 9.9 - 9.9      Component Value Date/Time   PROT 6.8 01/17/2015 0907   ALBUMIN 3.9 01/17/2015 0907   AST 27 01/17/2015 0907   ALT 28 01/17/2015 0907   ALKPHOS 45 01/17/2015 0907   BILITOT 0.5 01/17/2015 0907   BILIDIR 0.1 01/11/2014 1507    CBC Latest Ref Rng 01/17/2015 01/11/2014 12/28/2013  WBC 4.0 - 10.5 K/uL 5.7 7.8 6.8  Hemoglobin 12.0 - 15.0 g/dL 13.9 13.8 13.6  Hematocrit 36.0 - 46.0 % 42.4 40.3 41.2  Platelets 150 - 400 K/uL 182 182.0 183.0   Lab Results  Component Value Date   MCV 95.1 01/17/2015   MCV 94.2 01/11/2014   MCV 95.4 12/28/2013   Lab Results  Component Value Date   TSH 1.691 01/10/2011   Lab Results  Component Value Date   HGBA1C 6.5 08/13/2013   Lipid Panel     Component Value Date/Time   CHOL 136 01/17/2015 0907   TRIG 194* 01/17/2015 0907   HDL 39* 01/17/2015 0907   CHOLHDL 3.5 01/17/2015 0907   VLDL 39 01/17/2015 0907   LDLCALC 58 01/17/2015 0907     BNP      Component Value Date/Time   PROBNP 86.0 01/10/2011 1357    Lipid Panel     Component Value Date/Time   CHOL 136 01/17/2015 0907   TRIG 194* 01/17/2015 0907   HDL 39* 01/17/2015 0907   CHOLHDL 3.5 01/17/2015 0907   VLDL 39 01/17/2015 0907   LDLCALC 58 01/17/2015 0907     RADIOLOGY: No results found.    ASSESSMENT AND PLAN: Rachel Nichols is an 81 year old female who  is 60 1/2 years following her myocardial infarction and PTCA of her right coronary artery. She is 41/2  years status post stenting of an 80% stenosis in the proximal portion of a ramus intermediate vessel treated with a Resolute DES stent.  Her blood pressure today is stable on her current therapy.  She is bradycardic with a resting heart rate of 55, but remains asymptomatic on her current dose of on low-dose atenolol.  She continues to be on  Lipid-lowering therapy with combination Crestor and Zetia and is tolerating this well without myalgias.  Laboratory in April revealed excellent LDL at 58 and total cholesterol 136; however, triglycerides were slightly elevated at 194.  Dietary adjustments were recommended.  When I last saw her, I reduced her aspirin to 81 mg.  She is tolerating this well.  I reviewed her most recent echo Doppler study to evaluate her cardiac murmur.  On this study.  She was not felt to have any aortic stenosis.  She continues to have normal wall motion and left ventricular function with grade 1 diastolic dysfunction.  Presently, she will continue her current medical regimen.  As long she remains stable, I will see her in one year for follow-up evaluation.   Time spent: 25 minutes  Troy Sine, MD, [facility]  07/15/2015 8:20 AM

## 2015-10-03 ENCOUNTER — Other Ambulatory Visit: Payer: Self-pay | Admitting: Cardiovascular Disease

## 2015-10-26 ENCOUNTER — Other Ambulatory Visit: Payer: Self-pay | Admitting: *Deleted

## 2015-10-28 ENCOUNTER — Telehealth: Payer: Self-pay | Admitting: Cardiovascular Disease

## 2015-10-28 NOTE — Telephone Encounter (Signed)
Spoke with pt, questions regarding brand zetia and generic zetia answered.

## 2015-10-28 NOTE — Telephone Encounter (Signed)
Pt's husband answered, states pt is out to lunch and will call back.

## 2015-10-28 NOTE — Telephone Encounter (Signed)
Please call,having problem with her medication.

## 2015-10-28 NOTE — Telephone Encounter (Signed)
Returning Call .Marland Kitchen Please call

## 2015-10-31 ENCOUNTER — Other Ambulatory Visit: Payer: Self-pay | Admitting: Cardiovascular Disease

## 2015-10-31 NOTE — Telephone Encounter (Signed)
Rx request sent to pharmacy.  

## 2015-11-01 ENCOUNTER — Telehealth: Payer: Self-pay | Admitting: Family Medicine

## 2015-11-01 NOTE — Telephone Encounter (Signed)
Ok to transfer to [place]

## 2015-11-01 NOTE — Telephone Encounter (Signed)
Pt had flu shot at drug store 08/2015 - pt last visit in 01/2014 - pt states that she would like [facility] provider closer to her home. Her spouse goes to [facility]. Provided her [facility] phone # to see if there is anyone accepting new pts for her to transfer.

## 2015-11-01 NOTE — Telephone Encounter (Signed)
Updated.      KP 

## 2015-11-08 DIAGNOSIS — L821 Other seborrheic keratosis: Secondary | ICD-10-CM | POA: Diagnosis not present

## 2015-11-08 DIAGNOSIS — L718 Other rosacea: Secondary | ICD-10-CM | POA: Diagnosis not present

## 2015-11-08 DIAGNOSIS — Z85828 Personal history of other malignant neoplasm of skin: Secondary | ICD-10-CM | POA: Diagnosis not present

## 2015-11-14 DIAGNOSIS — Z961 Presence of intraocular lens: Secondary | ICD-10-CM | POA: Diagnosis not present

## 2015-11-15 ENCOUNTER — Encounter: Payer: Self-pay | Admitting: Family Medicine

## 2015-11-15 ENCOUNTER — Ambulatory Visit (INDEPENDENT_AMBULATORY_CARE_PROVIDER_SITE_OTHER): Payer: Medicare Other | Admitting: Family Medicine

## 2015-11-15 VITALS — BP 108/64 | HR 65 | Temp 98.1°F | Ht 64.0 in | Wt 143.6 lb

## 2015-11-15 DIAGNOSIS — E785 Hyperlipidemia, unspecified: Secondary | ICD-10-CM | POA: Diagnosis not present

## 2015-11-15 DIAGNOSIS — Z0000 Encounter for general adult medical examination without abnormal findings: Secondary | ICD-10-CM | POA: Diagnosis not present

## 2015-11-15 DIAGNOSIS — I2510 Atherosclerotic heart disease of native coronary artery without angina pectoris: Secondary | ICD-10-CM

## 2015-11-15 DIAGNOSIS — Z23 Encounter for immunization: Secondary | ICD-10-CM

## 2015-11-15 MED ORDER — ATENOLOL 25 MG PO TABS: 12.5000 mg | ORAL_TABLET | Freq: Every day | ORAL | Status: DC

## 2015-11-15 MED ORDER — ROSUVASTATIN CALCIUM 10 MG PO TABS: 10.0000 mg | ORAL_TABLET | Freq: Every day | ORAL | Status: DC

## 2015-11-15 MED ORDER — EZETIMIBE 10 MG PO TABS: 10.0000 mg | ORAL_TABLET | Freq: Every day | ORAL | Status: DC

## 2015-11-15 NOTE — Progress Notes (Signed)
Pre visit review using our clinic review tool, if applicable. No additional management support is needed unless otherwise documented below in the visit note. 

## 2015-11-15 NOTE — Patient Instructions (Signed)
Preventive Care for Adults, Female A healthy lifestyle and preventive care can promote health and wellness. Preventive health guidelines for women include the following key practices.  A routine yearly physical is a good way to check with your health care provider about your health and preventive screening. It is a chance to share any concerns and updates on your health and to receive a thorough exam.  Visit your dentist for a routine exam and preventive care every 6 months. Brush your teeth twice a day and floss once a day. Good oral hygiene prevents tooth decay and gum disease.  The frequency of eye exams is based on your age, health, family medical history, use of contact lenses, and other factors. Follow your health care provider's recommendations for frequency of eye exams.  Eat a healthy diet. Foods like vegetables, fruits, whole grains, low-fat dairy products, and lean protein foods contain the nutrients you need without too many calories. Decrease your intake of foods high in solid fats, added sugars, and salt. Eat the right amount of calories for you.Get information about a proper diet from your health care provider, if necessary.  Regular physical exercise is one of the most important things you can do for your health. Most adults should get at least 150 minutes of moderate-intensity exercise (any activity that increases your heart rate and causes you to sweat) each week. In addition, most adults need muscle-strengthening exercises on 2 or more days a week.  Maintain a healthy weight. The body mass index (BMI) is a screening tool to identify possible weight problems. It provides an estimate of body fat based on height and weight. Your health care provider can find your BMI and can help you achieve or maintain a healthy weight.For adults 20 years and older:  A BMI below 18.5 is considered underweight.  A BMI of 18.5 to 24.9 is normal.  A BMI of 25 to 29.9 is considered overweight.  A  BMI of 30 and above is considered obese.  Maintain normal blood lipids and cholesterol levels by exercising and minimizing your intake of saturated fat. Eat a balanced diet with plenty of fruit and vegetables. Blood tests for lipids and cholesterol should begin at age 45 and be repeated every 5 years. If your lipid or cholesterol levels are high, you are over 50, or you are at high risk for heart disease, you may need your cholesterol levels checked more frequently.Ongoing high lipid and cholesterol levels should be treated with medicines if diet and exercise are not working.  If you smoke, find out from your health care provider how to quit. If you do not use tobacco, do not start.  Lung cancer screening is recommended for adults aged 45-80 years who are at high risk for developing lung cancer because of a history of smoking. A yearly low-dose CT scan of the lungs is recommended for people who have at least a 30-pack-year history of smoking and are a current smoker or have quit within the past 15 years. A pack year of smoking is smoking an average of 1 pack of cigarettes a day for 1 year (for example: 1 pack a day for 30 years or 2 packs a day for 15 years). Yearly screening should continue until the smoker has stopped smoking for at least 15 years. Yearly screening should be stopped for people who develop a health problem that would prevent them from having lung cancer treatment.  If you are pregnant, do not drink alcohol. If you are  breastfeeding, be very cautious about drinking alcohol. If you are not pregnant and choose to drink alcohol, do not have more than 1 drink per day. One drink is considered to be 12 ounces (355 mL) of beer, 5 ounces (148 mL) of wine, or 1.5 ounces (44 mL) of liquor.  Avoid use of street drugs. Do not share needles with anyone. Ask for help if you need support or instructions about stopping the use of drugs.  High blood pressure causes heart disease and increases the risk  of stroke. Your blood pressure should be checked at least every 1 to 2 years. Ongoing high blood pressure should be treated with medicines if weight loss and exercise do not work.  If you are 55-79 years old, ask your health care provider if you should take aspirin to prevent strokes.  Diabetes screening is done by taking a blood sample to check your blood glucose level after you have not eaten for a certain period of time (fasting). If you are not overweight and you do not have risk factors for diabetes, you should be screened once every 3 years starting at age 45. If you are overweight or obese and you are 40-70 years of age, you should be screened for diabetes every year as part of your cardiovascular risk assessment.  Breast cancer screening is essential preventive care for women. You should practice "breast self-awareness." This means understanding the normal appearance and feel of your breasts and may include breast self-examination. Any changes detected, no matter how small, should be reported to a health care provider. Women in their 20s and 30s should have a clinical breast exam (CBE) by a health care provider as part of a regular health exam every 1 to 3 years. After age 40, women should have a CBE every year. Starting at age 40, women should consider having a mammogram (breast X-ray test) every year. Women who have a family history of breast cancer should talk to their health care provider about genetic screening. Women at a high risk of breast cancer should talk to their health care providers about having an MRI and a mammogram every year.  Breast cancer gene (BRCA)-related cancer risk assessment is recommended for women who have family members with BRCA-related cancers. BRCA-related cancers include breast, ovarian, tubal, and peritoneal cancers. Having family members with these cancers may be associated with an increased risk for harmful changes (mutations) in the breast cancer genes BRCA1 and  BRCA2. Results of the assessment will determine the need for genetic counseling and BRCA1 and BRCA2 testing.  Your health care provider may recommend that you be screened regularly for cancer of the pelvic organs (ovaries, uterus, and vagina). This screening involves a pelvic examination, including checking for microscopic changes to the surface of your cervix (Pap test). You may be encouraged to have this screening done every 3 years, beginning at age 21.  For women ages 30-65, health care providers may recommend pelvic exams and Pap testing every 3 years, or they may recommend the Pap and pelvic exam, combined with testing for human papilloma virus (HPV), every 5 years. Some types of HPV increase your risk of cervical cancer. Testing for HPV may also be done on women of any age with unclear Pap test results.  Other health care providers may not recommend any screening for nonpregnant women who are considered low risk for pelvic cancer and who do not have symptoms. Ask your health care provider if a screening pelvic exam is right for   you.  If you have had past treatment for cervical cancer or a condition that could lead to cancer, you need Pap tests and screening for cancer for at least 20 years after your treatment. If Pap tests have been discontinued, your risk factors (such as having a new sexual partner) need to be reassessed to determine if screening should resume. Some women have medical problems that increase the chance of getting cervical cancer. In these cases, your health care provider may recommend more frequent screening and Pap tests.  Colorectal cancer can be detected and often prevented. Most routine colorectal cancer screening begins at the age of 50 years and continues through age 75 years. However, your health care provider may recommend screening at an earlier age if you have risk factors for colon cancer. On a yearly basis, your health care provider may provide home test kits to check  for hidden blood in the stool. Use of a small camera at the end of a tube, to directly examine the colon (sigmoidoscopy or colonoscopy), can detect the earliest forms of colorectal cancer. Talk to your health care provider about this at age 50, when routine screening begins. Direct exam of the colon should be repeated every 5-10 years through age 75 years, unless early forms of precancerous polyps or small growths are found.  People who are at an increased risk for hepatitis B should be screened for this virus. You are considered at high risk for hepatitis B if:  You were born in a country where hepatitis B occurs often. Talk with your health care provider about which countries are considered high risk.  Your parents were born in a high-risk country and you have not received a shot to protect against hepatitis B (hepatitis B vaccine).  You have HIV or AIDS.  You use needles to inject street drugs.  You live with, or have sex with, someone who has hepatitis B.  You get hemodialysis treatment.  You take certain medicines for conditions like cancer, organ transplantation, and autoimmune conditions.  Hepatitis C blood testing is recommended for all people born from 1945 through 1965 and any individual with known risks for hepatitis C.  Practice safe sex. Use condoms and avoid high-risk sexual practices to reduce the spread of sexually transmitted infections (STIs). STIs include gonorrhea, chlamydia, syphilis, trichomonas, herpes, HPV, and human immunodeficiency virus (HIV). Herpes, HIV, and HPV are viral illnesses that have no cure. They can result in disability, cancer, and death.  You should be screened for sexually transmitted illnesses (STIs) including gonorrhea and chlamydia if:  You are sexually active and are younger than 24 years.  You are older than 24 years and your health care provider tells you that you are at risk for this type of infection.  Your sexual activity has changed  since you were last screened and you are at an increased risk for chlamydia or gonorrhea. Ask your health care provider if you are at risk.  If you are at risk of being infected with HIV, it is recommended that you take a prescription medicine daily to prevent HIV infection. This is called preexposure prophylaxis (PrEP). You are considered at risk if:  You are sexually active and do not regularly use condoms or know the HIV status of your partner(s).  You take drugs by injection.  You are sexually active with a partner who has HIV.  Talk with your health care provider about whether you are at high risk of being infected with HIV. If   you choose to begin PrEP, you should first be tested for HIV. You should then be tested every 3 months for as long as you are taking PrEP.  Osteoporosis is a disease in which the bones lose minerals and strength with aging. This can result in serious bone fractures or breaks. The risk of osteoporosis can be identified using a bone density scan. Women ages 67 years and over and women at risk for fractures or osteoporosis should discuss screening with their health care providers. Ask your health care provider whether you should take a calcium supplement or vitamin D to reduce the rate of osteoporosis.  Menopause can be associated with physical symptoms and risks. Hormone replacement therapy is available to decrease symptoms and risks. You should talk to your health care provider about whether hormone replacement therapy is right for you.  Use sunscreen. Apply sunscreen liberally and repeatedly throughout the day. You should seek shade when your shadow is shorter than you. Protect yourself by wearing long sleeves, pants, a wide-brimmed hat, and sunglasses year round, whenever you are outdoors.  Once a month, do a whole body skin exam, using a mirror to look at the skin on your back. Tell your health care provider of new moles, moles that have irregular borders, moles that  are larger than a pencil eraser, or moles that have changed in shape or color.  Stay current with required vaccines (immunizations).  Influenza vaccine. All adults should be immunized every year.  Tetanus, diphtheria, and acellular pertussis (Td, Tdap) vaccine. Pregnant women should receive 1 dose of Tdap vaccine during each pregnancy. The dose should be obtained regardless of the length of time since the last dose. Immunization is preferred during the 27th-36th week of gestation. An adult who has not previously received Tdap or who does not know her vaccine status should receive 1 dose of Tdap. This initial dose should be followed by tetanus and diphtheria toxoids (Td) booster doses every 10 years. Adults with an unknown or incomplete history of completing a 3-dose immunization series with Td-containing vaccines should begin or complete a primary immunization series including a Tdap dose. Adults should receive a Td booster every 10 years.  Varicella vaccine. An adult without evidence of immunity to varicella should receive 2 doses or a second dose if she has previously received 1 dose. Pregnant females who do not have evidence of immunity should receive the first dose after pregnancy. This first dose should be obtained before leaving the health care facility. The second dose should be obtained 4-8 weeks after the first dose.  Human papillomavirus (HPV) vaccine. Females aged 13-26 years who have not received the vaccine previously should obtain the 3-dose series. The vaccine is not recommended for use in pregnant females. However, pregnancy testing is not needed before receiving a dose. If a female is found to be pregnant after receiving a dose, no treatment is needed. In that case, the remaining doses should be delayed until after the pregnancy. Immunization is recommended for any person with an immunocompromised condition through the age of 61 years if she did not get any or all doses earlier. During the  3-dose series, the second dose should be obtained 4-8 weeks after the first dose. The third dose should be obtained 24 weeks after the first dose and 16 weeks after the second dose.  Zoster vaccine. One dose is recommended for adults aged 30 years or older unless certain conditions are present.  Measles, mumps, and rubella (MMR) vaccine. Adults born  before 1957 generally are considered immune to measles and mumps. Adults born in 1957 or later should have 1 or more doses of MMR vaccine unless there is a contraindication to the vaccine or there is laboratory evidence of immunity to each of the three diseases. A routine second dose of MMR vaccine should be obtained at least 28 days after the first dose for students attending postsecondary schools, health care workers, or international travelers. People who received inactivated measles vaccine or an unknown type of measles vaccine during 1963-1967 should receive 2 doses of MMR vaccine. People who received inactivated mumps vaccine or an unknown type of mumps vaccine before 1979 and are at high risk for mumps infection should consider immunization with 2 doses of MMR vaccine. For females of childbearing age, rubella immunity should be determined. If there is no evidence of immunity, females who are not pregnant should be vaccinated. If there is no evidence of immunity, females who are pregnant should delay immunization until after pregnancy. Unvaccinated health care workers born before 1957 who lack laboratory evidence of measles, mumps, or rubella immunity or laboratory confirmation of disease should consider measles and mumps immunization with 2 doses of MMR vaccine or rubella immunization with 1 dose of MMR vaccine.  Pneumococcal 13-valent conjugate (PCV13) vaccine. When indicated, a person who is uncertain of his immunization history and has no record of immunization should receive the PCV13 vaccine. All adults 65 years of age and older should receive this  vaccine. An adult aged 19 years or older who has certain medical conditions and has not been previously immunized should receive 1 dose of PCV13 vaccine. This PCV13 should be followed with a dose of pneumococcal polysaccharide (PPSV23) vaccine. Adults who are at high risk for pneumococcal disease should obtain the PPSV23 vaccine at least 8 weeks after the dose of PCV13 vaccine. Adults older than 80 years of age who have normal immune system function should obtain the PPSV23 vaccine dose at least 1 year after the dose of PCV13 vaccine.  Pneumococcal polysaccharide (PPSV23) vaccine. When PCV13 is also indicated, PCV13 should be obtained first. All adults aged 65 years and older should be immunized. An adult younger than age 65 years who has certain medical conditions should be immunized. Any person who resides in a nursing home or long-term care facility should be immunized. An adult smoker should be immunized. People with an immunocompromised condition and certain other conditions should receive both PCV13 and PPSV23 vaccines. People with human immunodeficiency virus (HIV) infection should be immunized as soon as possible after diagnosis. Immunization during chemotherapy or radiation therapy should be avoided. Routine use of PPSV23 vaccine is not recommended for American Indians, [place] Natives, or people younger than 65 years unless there are medical conditions that require PPSV23 vaccine. When indicated, people who have unknown immunization and have no record of immunization should receive PPSV23 vaccine. One-time revaccination 5 years after the first dose of PPSV23 is recommended for people aged 19-64 years who have chronic kidney failure, nephrotic syndrome, asplenia, or immunocompromised conditions. People who received 1-2 doses of PPSV23 before age 65 years should receive another dose of PPSV23 vaccine at age 65 years or later if at least 5 years have passed since the previous dose. Doses of PPSV23 are not  needed for people immunized with PPSV23 at or after age 65 years.  Meningococcal vaccine. Adults with asplenia or persistent complement component deficiencies should receive 2 doses of quadrivalent meningococcal conjugate (MenACWY-D) vaccine. The doses should be obtained   at least 2 months apart. Microbiologists working with certain meningococcal bacteria, [place] recruits, people at risk during an outbreak, and people who travel to or live in countries with a high rate of meningitis should be immunized. A first-year college student up through age 34 years who is living in a residence hall should receive a dose if she did not receive a dose on or after her 16th birthday. Adults who have certain high-risk conditions should receive one or more doses of vaccine.  Hepatitis A vaccine. Adults who wish to be protected from this disease, have certain high-risk conditions, work with hepatitis A-infected animals, work in hepatitis A research labs, or travel to or work in countries with a high rate of hepatitis A should be immunized. Adults who were previously unvaccinated and who anticipate close contact with an international adoptee during the first 60 days after arrival in the [place] from a country with a high rate of hepatitis A should be immunized.  Hepatitis B vaccine. Adults who wish to be protected from this disease, have certain high-risk conditions, may be exposed to blood or other infectious body fluids, are household contacts or sex partners of hepatitis B positive people, are clients or workers in certain care facilities, or travel to or work in countries with a high rate of hepatitis B should be immunized.  Haemophilus influenzae type b (Hib) vaccine. A previously unvaccinated person with asplenia or sickle cell disease or having a scheduled splenectomy should receive 1 dose of Hib vaccine. Regardless of previous immunization, a recipient of a hematopoietic stem cell transplant should receive a  3-dose series 6-12 months after her successful transplant. Hib vaccine is not recommended for adults with HIV infection. Preventive Services / Frequency Ages 35 to 4 years  Blood pressure check.** / Every 3-5 years.  Lipid and cholesterol check.** / Every 5 years beginning at age 60.  Clinical breast exam.** / Every 3 years for women in their 71s and 10s.  BRCA-related cancer risk assessment.** / For women who have family members with a BRCA-related cancer (breast, ovarian, tubal, or peritoneal cancers).  Pap test.** / Every 2 years from ages 76 through 26. Every 3 years starting at age 61 through age 76 or 93 with a history of 3 consecutive normal Pap tests.  HPV screening.** / Every 3 years from ages 37 through ages 60 to 51 with a history of 3 consecutive normal Pap tests.  Hepatitis C blood test.** / For any individual with known risks for hepatitis C.  Skin self-exam. / Monthly.  Influenza vaccine. / Every year.  Tetanus, diphtheria, and acellular pertussis (Tdap, Td) vaccine.** / Consult your health care provider. Pregnant women should receive 1 dose of Tdap vaccine during each pregnancy. 1 dose of Td every 10 years.  Varicella vaccine.** / Consult your health care provider. Pregnant females who do not have evidence of immunity should receive the first dose after pregnancy.  HPV vaccine. / 3 doses over 6 months, if 93 and younger. The vaccine is not recommended for use in pregnant females. However, pregnancy testing is not needed before receiving a dose.  Measles, mumps, rubella (MMR) vaccine.** / You need at least 1 dose of MMR if you were born in 1957 or later. You may also need a 2nd dose. For females of childbearing age, rubella immunity should be determined. If there is no evidence of immunity, females who are not pregnant should be vaccinated. If there is no evidence of immunity, females who are  pregnant should delay immunization until after pregnancy.  Pneumococcal  13-valent conjugate (PCV13) vaccine.** / Consult your health care provider.  Pneumococcal polysaccharide (PPSV23) vaccine.** / 1 to 2 doses if you smoke cigarettes or if you have certain conditions.  Meningococcal vaccine.** / 1 dose if you are age 68 to 8 years and a Market researcher living in a residence hall, or have one of several medical conditions, you need to get vaccinated against meningococcal disease. You may also need additional booster doses.  Hepatitis A vaccine.** / Consult your health care provider.  Hepatitis B vaccine.** / Consult your health care provider.  Haemophilus influenzae type b (Hib) vaccine.** / Consult your health care provider. Ages 7 to 53 years  Blood pressure check.** / Every year.  Lipid and cholesterol check.** / Every 5 years beginning at age 25 years.  Lung cancer screening. / Every year if you are aged 11-80 years and have a 30-pack-year history of smoking and currently smoke or have quit within the past 15 years. Yearly screening is stopped once you have quit smoking for at least 15 years or develop a health problem that would prevent you from having lung cancer treatment.  Clinical breast exam.** / Every year after age 48 years.  BRCA-related cancer risk assessment.** / For women who have family members with a BRCA-related cancer (breast, ovarian, tubal, or peritoneal cancers).  Mammogram.** / Every year beginning at age 41 years and continuing for as long as you are in good health. Consult with your health care provider.  Pap test.** / Every 3 years starting at age 65 years through age 37 or 70 years with a history of 3 consecutive normal Pap tests.  HPV screening.** / Every 3 years from ages 72 years through ages 60 to 40 years with a history of 3 consecutive normal Pap tests.  Fecal occult blood test (FOBT) of stool. / Every year beginning at age 21 years and continuing until age 5 years. You may not need to do this test if you get  a colonoscopy every 10 years.  Flexible sigmoidoscopy or colonoscopy.** / Every 5 years for a flexible sigmoidoscopy or every 10 years for a colonoscopy beginning at age 35 years and continuing until age 48 years.  Hepatitis C blood test.** / For all people born from [birth year] through 1965 and any individual with known risks for hepatitis C.  Skin self-exam. / Monthly.  Influenza vaccine. / Every year.  Tetanus, diphtheria, and acellular pertussis (Tdap/Td) vaccine.** / Consult your health care provider. Pregnant women should receive 1 dose of Tdap vaccine during each pregnancy. 1 dose of Td every 10 years.  Varicella vaccine.** / Consult your health care provider. Pregnant females who do not have evidence of immunity should receive the first dose after pregnancy.  Zoster vaccine.** / 1 dose for adults aged 30 years or older.  Measles, mumps, rubella (MMR) vaccine.** / You need at least 1 dose of MMR if you were born in 1957 or later. You may also need a second dose. For females of childbearing age, rubella immunity should be determined. If there is no evidence of immunity, females who are not pregnant should be vaccinated. If there is no evidence of immunity, females who are pregnant should delay immunization until after pregnancy.  Pneumococcal 13-valent conjugate (PCV13) vaccine.** / Consult your health care provider.  Pneumococcal polysaccharide (PPSV23) vaccine.** / 1 to 2 doses if you smoke cigarettes or if you have certain conditions.  Meningococcal vaccine.** /  Consult your health care provider.  Hepatitis A vaccine.** / Consult your health care provider.  Hepatitis B vaccine.** / Consult your health care provider.  Haemophilus influenzae type b (Hib) vaccine.** / Consult your health care provider. Ages 64 years and over  Blood pressure check.** / Every year.  Lipid and cholesterol check.** / Every 5 years beginning at age 23 years.  Lung cancer screening. / Every year if you  are aged 16-80 years and have a 30-pack-year history of smoking and currently smoke or have quit within the past 15 years. Yearly screening is stopped once you have quit smoking for at least 15 years or develop a health problem that would prevent you from having lung cancer treatment.  Clinical breast exam.** / Every year after age 74 years.  BRCA-related cancer risk assessment.** / For women who have family members with a BRCA-related cancer (breast, ovarian, tubal, or peritoneal cancers).  Mammogram.** / Every year beginning at age 44 years and continuing for as long as you are in good health. Consult with your health care provider.  Pap test.** / Every 3 years starting at age 58 years through age 22 or 39 years with 3 consecutive normal Pap tests. Testing can be stopped between 65 and 70 years with 3 consecutive normal Pap tests and no abnormal Pap or HPV tests in the past 10 years.  HPV screening.** / Every 3 years from ages 64 years through ages 70 or 61 years with a history of 3 consecutive normal Pap tests. Testing can be stopped between 65 and 70 years with 3 consecutive normal Pap tests and no abnormal Pap or HPV tests in the past 10 years.  Fecal occult blood test (FOBT) of stool. / Every year beginning at age 40 years and continuing until age 27 years. You may not need to do this test if you get a colonoscopy every 10 years.  Flexible sigmoidoscopy or colonoscopy.** / Every 5 years for a flexible sigmoidoscopy or every 10 years for a colonoscopy beginning at age 7 years and continuing until age 32 years.  Hepatitis C blood test.** / For all people born from [birth year] through 1965 and any individual with known risks for hepatitis C.  Osteoporosis screening.** / A one-time screening for women ages 30 years and over and women at risk for fractures or osteoporosis.  Skin self-exam. / Monthly.  Influenza vaccine. / Every year.  Tetanus, diphtheria, and acellular pertussis (Tdap/Td)  vaccine.** / 1 dose of Td every 10 years.  Varicella vaccine.** / Consult your health care provider.  Zoster vaccine.** / 1 dose for adults aged 35 years or older.  Pneumococcal 13-valent conjugate (PCV13) vaccine.** / Consult your health care provider.  Pneumococcal polysaccharide (PPSV23) vaccine.** / 1 dose for all adults aged 46 years and older.  Meningococcal vaccine.** / Consult your health care provider.  Hepatitis A vaccine.** / Consult your health care provider.  Hepatitis B vaccine.** / Consult your health care provider.  Haemophilus influenzae type b (Hib) vaccine.** / Consult your health care provider. ** Family history and personal history of risk and conditions may change your health care provider's recommendations.   This information is not intended to replace advice given to you by your health care provider. Make sure you discuss any questions you have with your health care provider.   Document Released: 11/20/2001 Document Revised: 10/15/2014 Document Reviewed: 02/19/2011 Elsevier Interactive Patient Education [employer].

## 2015-11-15 NOTE — Progress Notes (Signed)
Subjective:   Rachel Nichols is a 82 y.o. female who presents for Medicare Annual (Subsequent) preventive examination.  Review of Systems:   Review of Systems  Constitutional: Negative for activity change, appetite change and fatigue.  HENT: Negative for hearing loss, congestion, tinnitus and ear discharge.   Eyes: Negative for visual disturbance (see optho q1y -- vision corrected to 20/20 with glasses).  Respiratory: Negative for cough, chest tightness and shortness of breath.   Cardiovascular: Negative for chest pain, palpitations and leg swelling.  Gastrointestinal: Negative for abdominal pain, diarrhea, constipation and abdominal distention.  Genitourinary: Negative for urgency, frequency, decreased urine volume and difficulty urinating.  Musculoskeletal: Negative for back pain, arthralgias and gait problem.  Skin: Negative for color change, pallor and rash.  Neurological: Negative for dizziness, light-headedness, numbness and headaches.  Hematological: Negative for adenopathy. Does not bruise/bleed easily.  Psychiatric/Behavioral: Negative for suicidal ideas, confusion, sleep disturbance, self-injury, dysphoric mood, decreased concentration and agitation.  Pt is able to read and write and can do all ADLs No risk for falling No abuse/ violence in home           Objective:     Vitals: BP 108/64 mmHg  Pulse 65  Temp(Src) 98.1 F (36.7 C) (Oral)  Ht 5\' 4"  (1.626 m)  Wt 143 lb 9.6 oz (65.137 kg)  BMI 24.64 kg/m2  SpO2 97% BP 108/64 mmHg  Pulse 65  Temp(Src) 98.1 F (36.7 C) (Oral)  Ht 5\' 4"  (1.626 m)  Wt 143 lb 9.6 oz (65.137 kg)  BMI 24.64 kg/m2  SpO2 97% General appearance: alert, cooperative, appears stated age and no distress Head: Normocephalic, without obvious abnormality, atraumatic Eyes: conjunctivae/corneas clear. PERRL, EOM's intact. Fundi benign. Ears: normal TM's and external ear canals both ears Nose: Nares normal. Septum midline. Mucosa normal. No  drainage or sinus tenderness. Throat: lips, mucosa, and tongue normal; teeth and gums normal Neck: no adenopathy, no carotid bruit, no JVD, supple, symmetrical, trachea midline and thyroid not enlarged, symmetric, no tenderness/mass/nodules Back: symmetric, no curvature. ROM normal. No CVA tenderness. Lungs: clear to auscultation bilaterally Breasts: normal appearance, no masses or tenderness Heart: S1, S2 normal + 2/6 murmur Abdomen: soft, non-tender; bowel sounds normal; no masses,  no organomegaly Pelvic: deferred Extremities: extremities normal, atraumatic, no cyanosis or edema Pulses: 2+ and symmetric Skin: Skin color, texture, turgor normal. No rashes or lesions Lymph nodes: Cervical, supraclavicular, and axillary nodes normal. Neurologic: Alert and oriented X 3, normal strength and tone. Normal symmetric reflexes. Normal coordination and gait Psych- no depression, no anxiety  Tobacco History  Smoking status  . Former Smoker -- 2.00 packs/day for 15 years  . Types: Cigarettes  . Quit date: 10/09/1975  Smokeless tobacco  . Never Used     Counseling given: Not Answered   Past Medical History  Diagnosis Date  . Hyperlipidemia   . Basal cell carcinoma     Nose  . Heart attack ([place]) 1993    Cardiolyte stress---  stents 2012  . Diverticulosis 2002,2004,2007    Colonoscopy  . Benign neoplasm of colon 2004    Colonoscopy  . PUD (peptic ulcer disease)   . GERD (gastroesophageal reflux disease)   . Hiatal hernia   . Gastritis   . Duodenitis   . History of blood transfusion     pt denies  . CAD (coronary artery disease)     echo 09/13/10- EF>55%, aortic valve is mildly sclerotic; myoview 02-20-12-no ischemia  . Fibrocystic breast disease   .  Adenomatous colon polyp 2002    Colonoscopy   . Carotid bruit     carotid dopplers 05/16/04- normal  . Peptic ulcer due to Helicobacter pylori    Past Surgical History  Procedure Laterality Date  . Basal cell carcinoma excision        From the nose  . Cataract extraction  10/26/09    Right eye  . Cataract extraction  11/09/09    Left eye  . Finger surgery  04/2011    Cyst removal, right hand forefinger  . Coronary stent placement  01/10/11    hig grade ramus branch stenosis with mod LAD dz, PCI/stenting of ramus branch with a Resolute DES   . Cholecystectomy  1984  . Angioplasty  1993  . Dilation and curettage of uterus    . Left oophorectomy  1957  . Appendectomy  1957   Family History  Problem Relation Age of Onset  . Breast cancer Maternal Aunt     x 2 aunts  . Heart disease Brother 29    MI  . Heart disease Sister     stents  . Neuropathy Sister   . Hypertension Sister   . Stroke Mother   . Heart disease Father   . Irritable bowel syndrome Brother   . Heart attack Father   . Stroke Maternal Grandfather   . Heart attack Paternal Grandmother   . Heart attack Brother   . Heart attack Paternal Grandfather   . Colon cancer Neg Hx   . Epilepsy Daughter    History  Sexual Activity  . Sexual Activity:  . Partners: Male    Outpatient Encounter Prescriptions as of 11/15/2015  Medication Sig  . aspirin 81 MG tablet Take 81 mg by mouth daily.  Marland Kitchen atenolol (TENORMIN) 25 MG tablet TAKE 1/2 TABLET EVERY DAY  . cholecalciferol (VITAMIN D) 1000 UNITS tablet Take 2,000 Units by mouth daily.  . Coenzyme Q10 (CO Q10) 100 MG CAPS Take 200 mg by mouth daily.  . Cyanocobalamin (VITAMIN B 12 PO) Take 500 mg by mouth daily.  . magnesium oxide (MAG-OX) 400 MG tablet Take 400 mg by mouth daily.  . Multiple Vitamins-Minerals (CENTRUM SILVER ULTRA WOMENS) TABS Take 1 tablet by mouth daily.  . rosuvastatin (CRESTOR) 10 MG tablet Take 1 tablet (10 mg total) by mouth daily.  Marland Kitchen ZETIA 10 MG tablet TAKE 1 TABLET EVERY DAY  . [DISCONTINUED] minocycline (MINOCIN,DYNACIN) 100 MG capsule Take 1 capsule by mouth 2 (two) times a week.   No facility-administered encounter medications on file as of 11/15/2015.    Activities of Daily  Living In your present state of health, do you have any difficulty performing the following activities: 11/15/2015  Hearing? N  Vision? N  Difficulty concentrating or making decisions? N  Walking or climbing stairs? N  Dressing or bathing? N  Doing errands, shopping? N    Patient Care Team: Rosalita Chessman, DO as PCP - General Troy Sine, MD as Consulting Physician (Cardiology) Caralee Ates, DDS as Consulting Physician (Dentistry) Rutherford Guys, MD as Consulting Physician (Ophthalmology) Jarome Matin, MD as Consulting Physician (Dermatology)    Assessment:    CPE_   Exercise Activities and Dietary recommendations--- swimming 2x a week    Goals    None     Fall Risk Fall Risk  11/15/2015 08/13/2013  Falls in the past year? No Yes  Number falls in past yr: - 1  Injury with Fall? - Yes  Depression Screen PHQ 2/9 Scores 11/15/2015 08/13/2013 04/14/2012  PHQ - 2 Score 0 0 0     Cognitive Testing mmse 30/30  Immunization History  Administered Date(s) Administered  . Influenza Split 08/25/2012  . Influenza Whole 08/19/2008, 09/19/2009  . Influenza,inj,Quad PF,36+ Mos 08/03/2013  . Influenza-Unspecified 08/09/2015  . Pneumococcal Conjugate-13 11/15/2015  . Pneumococcal Polysaccharide-23 04/14/2012   Screening Tests Health Maintenance  Topic Date Due  . TETANUS/TDAP  11/14/2016 (Originally 05/11/1950)  . MAMMOGRAM  01/04/2016  . INFLUENZA VACCINE  05/08/2016  . DEXA SCAN  Completed  . ZOSTAVAX  Completed  . PNA vac Low Risk Adult  Completed      Plan:    see AVS During the course of the visit the patient was educated and counseled about the following appropriate screening and preventive services:   Vaccines to include Pneumoccal, Influenza, Hepatitis B, Td, Zostavax, HCV  Electrocardiogram  Cardiovascular Disease  Colorectal cancer screening  Bone density screening  Diabetes screening  Glaucoma screening  Mammography/PAP  Nutrition counseling    Patient Instructions (the written plan) was given to the patient.  1. Need for pneumococcal vaccination   - Pneumococcal conjugate vaccine 13-valent  2. Coronary artery disease involving native coronary artery of native heart without angina pectoris   - atenolol (TENORMIN) 25 MG tablet; Take 0.5 tablets (12.5 mg total) by mouth daily.  Dispense: 45 tablet; Refill: 3  3. Hyperlipidemia   - rosuvastatin (CRESTOR) 10 MG tablet; Take 1 tablet (10 mg total) by mouth daily.  Dispense: 90 tablet; Refill: 1 - ezetimibe (ZETIA) 10 MG tablet; Take 1 tablet (10 mg total) by mouth daily.  Dispense: 90 tablet; Refill: 2  4. Routine history and physical examination of adult   Garnet Koyanagi, DO  11/15/2015

## 2016-04-20 ENCOUNTER — Other Ambulatory Visit: Payer: Self-pay | Admitting: Cardiovascular Disease

## 2016-07-17 ENCOUNTER — Ambulatory Visit (INDEPENDENT_AMBULATORY_CARE_PROVIDER_SITE_OTHER): Payer: Medicare Other | Admitting: Cardiovascular Disease

## 2016-07-17 ENCOUNTER — Encounter: Payer: Self-pay | Admitting: Cardiovascular Disease

## 2016-07-17 VITALS — BP 92/54 | HR 56 | Ht 64.0 in | Wt 142.2 lb

## 2016-07-17 DIAGNOSIS — I10 Essential (primary) hypertension: Secondary | ICD-10-CM

## 2016-07-17 DIAGNOSIS — E785 Hyperlipidemia, unspecified: Secondary | ICD-10-CM | POA: Diagnosis not present

## 2016-07-17 DIAGNOSIS — Z79899 Other long term (current) drug therapy: Secondary | ICD-10-CM | POA: Diagnosis not present

## 2016-07-17 DIAGNOSIS — I2510 Atherosclerotic heart disease of native coronary artery without angina pectoris: Secondary | ICD-10-CM | POA: Diagnosis not present

## 2016-07-17 DIAGNOSIS — I2583 Coronary atherosclerosis due to lipid rich plaque: Secondary | ICD-10-CM

## 2016-07-17 DIAGNOSIS — R011 Cardiac murmur, unspecified: Secondary | ICD-10-CM

## 2016-07-17 MED ORDER — METOPROLOL SUCCINATE ER 25 MG PO TB24: 12.5000 mg | ORAL_TABLET | Freq: Every day | ORAL | 2 refills | Status: DC

## 2016-07-17 NOTE — Patient Instructions (Addendum)
Your physician recommends that you return for lab work fasting.  Stop atenolol. This has been replaced with metoprolol succ.  Your physician wants you to follow-up in: 1 year or sooner if needed. You will receive a reminder letter in the mail two months in advance. If you don't receive a letter, please call our office to schedule the follow-up appointment.  If you need a refill on your cardiac medications before your next appointment, please call your pharmacy.

## 2016-07-17 NOTE — Progress Notes (Signed)
Patient ID: Rachel Nichols, female   DOB: 17-Mar-1931, 82 y.o.   MRN: [id number]       HPI: [name], is a 82 y.o. female who presents to the office for a 12 month cardiology evaluation.  [name] is  suffered a myocardial infarction in January 1993 and underwent PTCA of her RCA. In April 2012, a 2.5x12 mm Resolute DES stent was inserted to the proximal portion of a ramus intermediate vessel. She  had 60% LAD stenosis after diagonal vessel as well as 40-50% mid right coronary artery stenosis for which she's been treated medically. A myocardial perfusion study in May 2013  remained normal. She has continued to remain stable on her current medical regimen. We had increased her Crestor to 10 mg to take in addition to this area there LDL particle concentration was still elevated at 1387. Subsequent blood work on 11/10/2012 showed marked improvement with her LDL now at 61 and LDL particle number at 1027 the total cholesterol was 126, HDL 43, triglycerides 108. Her insulin resistance score was still slightly elevated at 56.  WhenI saw her in April 2016 for 18 month follow-up evaluation, her cardiac murmur was more pronounced.  I scheduled her for an echo Doppler study.  This was done on 06/27/2015.  LV function was normal with an ejection fraction of 65-70%.  There was grade 1 diastolic dysfunction.  There were no wall motion abnormalities.  She had mild left atrial dilatation.  She was not found to have significant valvular pathology.   Over the past year, she has continued to do well and specifically denies any chest pain, shortness of breath, or palpitations.  She remains active.  She is swimming at least twice per week.  She has 13 steps between her upstairs and basement and climbs them multiple times per day without difficulty.  Presently, she denies any episodes of recurrent anginal type symptoms.  She denies PND, orthopnea. . She has been taking atenolol 12.5 mg daily .  Recently this has been on back  order and is been difficult for her to obtain this medication.  She has been on Zetia and Crestor for lipid management.  She presents for evaluation.  Past Medical History:  Diagnosis Date  . Adenomatous colon polyp 2002   Colonoscopy   . Basal cell carcinoma    Nose  . Benign neoplasm of colon 2004   Colonoscopy  . CAD (coronary artery disease)    echo 09/13/10- EF>55%, aortic valve is mildly sclerotic; myoview 02-20-12-no ischemia  . Carotid bruit    carotid dopplers 05/16/04- normal  . Diverticulosis 2002,2004,2007   Colonoscopy  . Duodenitis   . Fibrocystic breast disease   . Gastritis   . GERD (gastroesophageal reflux disease)   . Heart attack 1993   Cardiolyte stress---  stents 2012  . Hiatal hernia   . History of blood transfusion    pt denies  . Hyperlipidemia   . Peptic ulcer due to Helicobacter pylori   . PUD (peptic ulcer disease)     Past Surgical History:  Procedure Laterality Date  . ANGIOPLASTY  1993  . APPENDECTOMY  1957  . BASAL CELL CARCINOMA EXCISION     From the nose  . CATARACT EXTRACTION  10/26/09   Right eye  . CATARACT EXTRACTION  11/09/09   Left eye  . CHOLECYSTECTOMY  1984  . CORONARY STENT PLACEMENT  01/10/11   hig grade ramus branch stenosis with mod LAD dz, PCI/stenting of  ramus branch with a Resolute DES   . DILATION AND CURETTAGE OF UTERUS    . FINGER SURGERY  04/2011   Cyst removal, right hand forefinger  . LEFT OOPHORECTOMY  1957    Allergies  Allergen Reactions  . Diltiazem Hcl   . Tape Other (See Comments)    It pulls the skin off.  . Tramadol Hcl     Current Outpatient Prescriptions  Medication Sig Dispense Refill  . aspirin 81 MG tablet Take 81 mg by mouth daily.    . cholecalciferol (VITAMIN D) 1000 UNITS tablet Take 2,000 Units by mouth daily.    . Coenzyme Q10 (CO Q10) 100 MG CAPS Take 200 mg by mouth daily.    . Cyanocobalamin (VITAMIN B 12 PO) Take 500 mg by mouth daily.    Marland Kitchen ezetimibe (ZETIA) 10 MG tablet Take 1 tablet  (10 mg total) by mouth daily. 90 tablet 2  . Multiple Vitamins-Minerals (CENTRUM SILVER ULTRA WOMENS) TABS Take 1 tablet by mouth daily.    . rosuvastatin (CRESTOR) 10 MG tablet TAKE 1 TABLET EVERY DAY 90 tablet 0  . metoprolol succinate (TOPROL XL) 25 MG 24 hr tablet Take 0.5 tablets (12.5 mg total) by mouth daily. 45 tablet 2   No current facility-administered medications for this visit.    Socially she is married. Has one child who is living and one child who is deceased. She has 3 grandchildren. There is no tobacco or alcohol use.  ROS General: Negative; No fevers, chills, or night sweats HEENT: Negative; No changes in vision or hearing, sinus congestion, difficulty swallowing Pulmonary: Negative; No cough, wheezing, shortness of breath, hemoptysis Cardiovascular: Negative; No chest pain, presyncope, syncope, palpitations GI: Negative; No nausea, vomiting, diarrhea, or abdominal pain GU: Negative; No dysuria, hematuria, or difficulty voiding Musculoskeletal: Intermittent cramps in her legs and feet. Hematologic/Oncology: Negative; no easy bruising, bleeding Endocrine: Negative; no heat/cold intolerance; no diabetes Neuro: Negative; no changes in balance, headaches Skin: Negative; No rashes or skin lesions Psychiatric: Negative; No behavioral problems, depression Sleep: Negative; No snoring, daytime sleepiness, hypersomnolence, bruxism, restless legs, hypnogognic hallucinations, no cataplexy Other comprehensive 14 point system review is negative.   PE BP (!) 92/54 (BP Location: Left Arm, Patient Position: Sitting, Cuff Size: Normal)   Pulse (!) 56   Ht 5\' 4"  (1.626 m)   Wt 142 lb 3.2 oz (64.5 kg)   BMI 24.41 kg/m    Repeat blood pressure by me was 110/70  Wt Readings from Last 3 Encounters:  07/17/16 142 lb 3.2 oz (64.5 kg)  11/15/15 143 lb 9.6 oz (65.1 kg)  07/13/15 138 lb 11.2 oz (62.9 kg)   General: Alert, oriented, no distress.  HEENT: Normocephalic, atraumatic.  Pupils round and reactive; sclera anicteric;  Nose without nasal septal hypertrophy Mouth/Parynx benign; Mallinpatti scale 2 Neck: No JVD, no carotid bruits with normal carotid upstroke Lungs: clear to ausculatation and percussion; no wheezing or rales  chest wall: Nontender to palpation Heart: RRR, s1 s2 normal 2/6 systolic murmur n the aortic area.  No S3 gallop.  No diastolic murmur.  No rubs thrills or heaves. Abdomen: soft, nontender; no hepatosplenomehaly, BS+; abdominal aorta nontender and not dilated by palpation.  back: No CVA tenderness Pulses 2+ Extremities: no clubbing cyanosis or edema, Homan's sign negative  Neurologic: grossly nonfocal Psychologic: Normal affect and mood  ECG (independently read by me): Sinus bradycardia 56 bpm.  Normal intervals.  No ectopy.  October 2016 ECG (independently read by me): Sinus  bradycardia at 55 bpm.  No ectopy.  Normal intervals.  April 2016 ECG (independently read by me): Sinus bradycardia 59 bpm.  No ectopy.  Pior ECG: Sinus rhythm at 53 beats per minute. PR interval 166 ms QTc interval 426 ms.  LABS  BMP Latest Ref Rng & Units 01/17/2015 04/22/2014 01/11/2014  Glucose 70 - 99 mg/dL 96 - 103(H)  BUN 6 - 23 mg/dL 14 - 21  Creatinine 0.50 - 1.10 mg/dL 0.81 0.90 0.8  Sodium 135 - 145 mEq/L 141 - 135  Potassium 3.5 - 5.3 mEq/L 4.4 - 4.1  Chloride 96 - 112 mEq/L 105 - 104  CO2 19 - 32 mEq/L 29 - 24  Calcium 8.4 - 10.5 mg/dL 9.9 - 9.9      Component Value Date/Time   PROT 6.8 01/17/2015 0907   ALBUMIN 3.9 01/17/2015 0907   AST 27 01/17/2015 0907   ALT 28 01/17/2015 0907   ALKPHOS 45 01/17/2015 0907   BILITOT 0.5 01/17/2015 0907   BILIDIR 0.1 01/11/2014 1507    CBC Latest Ref Rng & Units 01/17/2015 01/11/2014 12/28/2013  WBC 4.0 - 10.5 K/uL 5.7 7.8 6.8  Hemoglobin 12.0 - 15.0 g/dL 13.9 13.8 13.6  Hematocrit 36.0 - 46.0 % 42.4 40.3 41.2  Platelets 150 - 400 K/uL 182 182.0 183.0   Lab Results  Component Value Date   MCV 95.1  01/17/2015   MCV 94.2 01/11/2014   MCV 95.4 12/28/2013   Lab Results  Component Value Date   TSH 1.691 01/10/2011   Lab Results  Component Value Date   HGBA1C 6.5 08/13/2013   Lipid Panel     Component Value Date/Time   CHOL 136 01/17/2015 0907   TRIG 194 (H) 01/17/2015 0907   HDL 39 (L) 01/17/2015 0907   CHOLHDL 3.5 01/17/2015 0907   VLDL 39 01/17/2015 0907   LDLCALC 58 01/17/2015 0907     BNP    Component Value Date/Time   PROBNP 86.0 01/10/2011 1357    Lipid Panel     Component Value Date/Time   CHOL 136 01/17/2015 0907   TRIG 194 (H) 01/17/2015 0907   HDL 39 (L) 01/17/2015 0907   CHOLHDL 3.5 01/17/2015 0907   VLDL 39 01/17/2015 0907   LDLCALC 58 01/17/2015 0907     RADIOLOGY: No results found.    ASSESSMENT AND PLAN: Ms. Langel is an 83 year old female who is 24 years following her myocardial infarction and PTCA of her RCA in January 1993.  She is 5 years status post stenting of an 80% stenosis in the proximal portion of a ramus intermediate vessel treated with a Resolute DES stent in April 2012.  Her blood pressure today is stable on her current therapy.  She is mildly bradycardic with a resting heart rate of 56, but remains asymptomatic on her current dose of on low-dose atenolol.  She continues to be on  Lipid-lowering therapy with combination Crestor and Zetia and is tolerating this well without myalgias.  I again reviewed her echo Doppler study from one year ago.  I am recommending a complete set of fasting blood work including a CMP, CBC, TSH, and lipid studies on her current medical regimen.  I will change her to metoprolol succinate 12.5 mg since her atenolol has been on back order.  I will contact her regarding her blood work.  Adjustments to her medical regimen are necessary.  As long as she remains stable I will see her in one year for cardiology reevaluation.  Time spent: 25 minutes  Troy Sine, MD, [facility]  07/17/2016 6:08 PM

## 2016-07-25 ENCOUNTER — Other Ambulatory Visit: Payer: Self-pay | Admitting: Cardiovascular Disease

## 2016-07-25 DIAGNOSIS — E785 Hyperlipidemia, unspecified: Secondary | ICD-10-CM

## 2016-07-26 NOTE — Telephone Encounter (Signed)
REFILL 

## 2016-08-02 DIAGNOSIS — I2510 Atherosclerotic heart disease of native coronary artery without angina pectoris: Secondary | ICD-10-CM | POA: Diagnosis not present

## 2016-08-02 DIAGNOSIS — I10 Essential (primary) hypertension: Secondary | ICD-10-CM | POA: Diagnosis not present

## 2016-08-02 DIAGNOSIS — Z79899 Other long term (current) drug therapy: Secondary | ICD-10-CM | POA: Diagnosis not present

## 2016-08-02 DIAGNOSIS — E785 Hyperlipidemia, unspecified: Secondary | ICD-10-CM | POA: Diagnosis not present

## 2016-08-02 DIAGNOSIS — I2583 Coronary atherosclerosis due to lipid rich plaque: Secondary | ICD-10-CM | POA: Diagnosis not present

## 2016-08-02 LAB — CBC
HEMATOCRIT: 44.2 % (ref 35.0–45.0)
Hemoglobin: 14.6 g/dL (ref 11.7–15.5)
MCH: 32.1 pg (ref 27.0–33.0)
MCHC: 33 g/dL (ref 32.0–36.0)
MCV: 97.1 fL (ref 80.0–100.0)
MPV: 11.2 fL (ref 7.5–12.5)
PLATELETS: 164 10*3/uL (ref 140–400)
RBC: 4.55 MIL/uL (ref 3.80–5.10)
RDW: 13.6 % (ref 11.0–15.0)
WBC: 4.7 10*3/uL (ref 3.8–10.8)

## 2016-08-02 LAB — COMPREHENSIVE METABOLIC PANEL
ALK PHOS: 45 U/L (ref 33–130)
ALT: 31 U/L — AB (ref 6–29)
AST: 28 U/L (ref 10–35)
Albumin: 4.2 g/dL (ref 3.6–5.1)
BUN: 14 mg/dL (ref 7–25)
CALCIUM: 10.1 mg/dL (ref 8.6–10.4)
CHLORIDE: 107 mmol/L (ref 98–110)
CO2: 26 mmol/L (ref 20–31)
Creat: 0.93 mg/dL — ABNORMAL HIGH (ref 0.60–0.88)
GLUCOSE: 106 mg/dL — AB (ref 65–99)
POTASSIUM: 4.3 mmol/L (ref 3.5–5.3)
Sodium: 141 mmol/L (ref 135–146)
Total Bilirubin: 0.6 mg/dL (ref 0.2–1.2)
Total Protein: 7.1 g/dL (ref 6.1–8.1)

## 2016-08-02 LAB — LIPID PANEL
CHOL/HDL RATIO: 3.3 ratio (ref <=5.0)
CHOLESTEROL: 149 mg/dL (ref 125–200)
HDL: 45 mg/dL — ABNORMAL LOW (ref >=46)
LDL Cholesterol: 75 mg/dL (ref <130)
Triglycerides: 146 mg/dL (ref <150)
VLDL: 29 mg/dL (ref <30)

## 2016-08-02 LAB — TSH: TSH: 1.6 m[IU]/L

## 2016-08-07 ENCOUNTER — Encounter: Payer: Self-pay | Admitting: *Deleted

## 2016-08-07 ENCOUNTER — Telehealth: Payer: Self-pay | Admitting: *Deleted

## 2016-08-07 NOTE — Telephone Encounter (Signed)
-----   Message from Troy Sine, MD sent at 08/06/2016 11:28 AM EDT ----- Labs good

## 2016-08-20 ENCOUNTER — Telehealth: Payer: Self-pay | Admitting: Family Medicine

## 2016-08-20 ENCOUNTER — Ambulatory Visit (INDEPENDENT_AMBULATORY_CARE_PROVIDER_SITE_OTHER): Payer: Medicare Other | Admitting: Family Medicine

## 2016-08-20 DIAGNOSIS — Z23 Encounter for immunization: Secondary | ICD-10-CM

## 2016-08-20 NOTE — Telephone Encounter (Signed)
Pt want to transfer from dr.Lowne will you except?

## 2016-08-20 NOTE — Telephone Encounter (Signed)
Does patient want to establish with [name] or [name]?

## 2016-08-22 NOTE — Telephone Encounter (Signed)
Want to know will cory take her

## 2016-08-29 NOTE — Telephone Encounter (Signed)
Pt was calling to get the status of the transfer from Dr. Etter Sjogren to [facility]

## 2016-08-29 NOTE — Telephone Encounter (Signed)
Please schedule establish care visit

## 2016-08-29 NOTE — Telephone Encounter (Signed)
I would be happy to see her if she would like to come to me

## 2016-08-31 NOTE — Telephone Encounter (Signed)
Ok with me 

## 2016-09-03 NOTE — Telephone Encounter (Signed)
Pt is aware of the below information. 

## 2016-11-13 ENCOUNTER — Other Ambulatory Visit: Payer: Self-pay | Admitting: Cardiovascular Disease

## 2016-11-14 NOTE — Telephone Encounter (Signed)
Rx(s) sent to pharmacy electronically.  

## 2016-12-03 DIAGNOSIS — L821 Other seborrheic keratosis: Secondary | ICD-10-CM | POA: Diagnosis not present

## 2016-12-03 DIAGNOSIS — Z85828 Personal history of other malignant neoplasm of skin: Secondary | ICD-10-CM | POA: Diagnosis not present

## 2016-12-03 DIAGNOSIS — H61001 Unspecified perichondritis of right external ear: Secondary | ICD-10-CM | POA: Diagnosis not present

## 2016-12-19 ENCOUNTER — Encounter: Payer: Self-pay | Admitting: Adult Health

## 2016-12-19 ENCOUNTER — Ambulatory Visit (INDEPENDENT_AMBULATORY_CARE_PROVIDER_SITE_OTHER): Payer: Medicare Other | Admitting: Adult Health

## 2016-12-19 VITALS — BP 110/84 | Temp 98.2°F | Ht 64.0 in | Wt 143.4 lb

## 2016-12-19 DIAGNOSIS — E785 Hyperlipidemia, unspecified: Secondary | ICD-10-CM

## 2016-12-19 DIAGNOSIS — I10 Essential (primary) hypertension: Secondary | ICD-10-CM

## 2016-12-19 DIAGNOSIS — M545 Low back pain, unspecified: Secondary | ICD-10-CM

## 2016-12-19 DIAGNOSIS — Z7689 Persons encountering health services in other specified circumstances: Secondary | ICD-10-CM | POA: Diagnosis not present

## 2016-12-19 DIAGNOSIS — R8290 Unspecified abnormal findings in urine: Secondary | ICD-10-CM

## 2016-12-19 DIAGNOSIS — Z780 Asymptomatic menopausal state: Secondary | ICD-10-CM

## 2016-12-19 LAB — POCT URINALYSIS DIPSTICK
Bilirubin, UA: NEGATIVE
Glucose, UA: NEGATIVE
KETONES UA: NEGATIVE
Nitrite, UA: NEGATIVE
PH UA: 6
PROTEIN UA: NEGATIVE
SPEC GRAV UA: 1.015
Urobilinogen, UA: NEGATIVE — AB

## 2016-12-19 MED ORDER — SULFAMETHOXAZOLE-TRIMETHOPRIM 800-160 MG PO TABS: 1.0000 | ORAL_TABLET | Freq: Two times a day (BID) | ORAL | 0 refills | Status: DC

## 2016-12-19 NOTE — Progress Notes (Signed)
Patient presents to clinic today to establish care. She is a pleasant 83 year old female who  has a past medical history of Adenomatous colon polyp (2002); Basal cell carcinoma; Benign neoplasm of colon (2004); CAD (coronary artery disease); Carotid bruit; Diverticulosis [phone]); Duodenitis; Fibrocystic breast disease; Gastritis; GERD (gastroesophageal reflux disease); Heart attack (1993); Hiatal hernia; History of blood transfusion; Hyperlipidemia; Peptic ulcer due to Helicobacter pylori; and PUD (peptic ulcer disease).   Acute Concerns: Establish Care   Low back pain - she reports ongoing low back pain and feels as though she may have a UTI. She denies hematuria, dysuria, frequency, or urgency. She reports " when I stretch out in bed, the pain seems to go away".   Chronic Issues: Hyperlipidemia - Zetia, Crestor and ASA 81 mg. Has significant family history of cardiac issues including MI  She takes Metoprolol s/p MI   Health Maintenance: Dental -- Routine Care Vision -- Routine care  Immunizations -- UTD  Colonoscopy -- 2011- no longer needs Mammogram -- 2016 Diet: Does not follow a specific diet Exercise: She likes to go swim and goes to the gym 2-3 times per week    Bone Density -- 2014  Is followed by:   Cardiology - Dr. Georgina Peer - one per year  Dermatology - yearly      Past Medical History:  Diagnosis Date  . Adenomatous colon polyp 2002   Colonoscopy   . Basal cell carcinoma    Nose  . Benign neoplasm of colon 2004   Colonoscopy  . CAD (coronary artery disease)    echo 09/13/10- EF>55%, aortic valve is mildly sclerotic; myoview 02-20-12-no ischemia  . Carotid bruit    carotid dopplers 05/16/04- normal  . Diverticulosis 2002,2004,2007   Colonoscopy  . Duodenitis   . Fibrocystic breast disease   . Gastritis   . GERD (gastroesophageal reflux disease)   . Heart attack 1993   Cardiolyte stress---  stents 2012  . Hiatal hernia   . History of blood  transfusion    pt denies  . Hyperlipidemia   . Peptic ulcer due to Helicobacter pylori   . PUD (peptic ulcer disease)     Past Surgical History:  Procedure Laterality Date  . ANGIOPLASTY  1993  . APPENDECTOMY  1957  . BASAL CELL CARCINOMA EXCISION     From the nose  . CATARACT EXTRACTION  10/26/09   Right eye  . CATARACT EXTRACTION  11/09/09   Left eye  . CHOLECYSTECTOMY  1984  . CORONARY STENT PLACEMENT  01/10/11   hig grade ramus branch stenosis with mod LAD dz, PCI/stenting of ramus branch with a Resolute DES   . DILATION AND CURETTAGE OF UTERUS    . FINGER SURGERY  04/2011   Cyst removal, right hand forefinger  . LEFT OOPHORECTOMY  1957    Current Outpatient Prescriptions on File Prior to Visit  Medication Sig Dispense Refill  . aspirin 81 MG tablet Take 81 mg by mouth daily.    . cholecalciferol (VITAMIN D) 1000 UNITS tablet Take 2,000 Units by mouth daily.    . Coenzyme Q10 (CO Q10) 100 MG CAPS Take 200 mg by mouth daily.    . Cyanocobalamin (VITAMIN B 12 PO) Take 500 mg by mouth daily.    Marland Kitchen ezetimibe (ZETIA) 10 MG tablet TAKE 1 TABLET EVERY DAY 90 tablet 3  . metoprolol succinate (TOPROL XL) 25 MG 24 hr tablet Take 0.5 tablets (12.5 mg total) by mouth daily. [place]arto  tablet 2  . Multiple Vitamins-Minerals (CENTRUM SILVER ULTRA WOMENS) TABS Take 1 tablet by mouth daily.    . rosuvastatin (CRESTOR) 10 MG tablet Take 1 tablet (10 mg total) by mouth daily. 90 tablet 2   No current facility-administered medications on file prior to visit.     Allergies  Allergen Reactions  . Diltiazem Hcl   . Tape Other (See Comments)    It pulls the skin off.  . Tramadol Hcl     Family History  Problem Relation Age of Onset  . Breast cancer Maternal Aunt     x 2 aunts  . Heart disease Brother 37    MI  . Heart disease Sister     stents  . Neuropathy Sister   . Hypertension Sister   . Stroke Mother   . Heart disease Father   . Irritable bowel syndrome Brother   . Heart attack Father    . Stroke Maternal Grandfather   . Heart attack Paternal Grandmother   . Heart attack Brother   . Heart attack Paternal Grandfather   . Colon cancer Neg Hx   . Epilepsy Daughter     Social History   Social History  . Marital status: Married    Spouse name: N/A  . Number of children: 2  . Years of education: N/A   Occupational History  . retired Radiation protection practitioner Retired   Social History Main Topics  . Smoking status: Former Smoker    Packs/day: 2.00    Years: 15.00    Types: Cigarettes    Quit date: 10/09/1975  . Smokeless tobacco: Never Used  . Alcohol use No  . Drug use: No  . Sexual activity: Not Currently    Partners: Male   Other Topics Concern  . Not on file   Social History Narrative   Exercise--  Gym 3 days a week,   2 days swim    Review of Systems  Constitutional: Negative.   Respiratory: Negative.   Cardiovascular: Negative.   Genitourinary: Negative.   Skin: Negative.   Neurological: Negative.   Psychiatric/Behavioral: Negative.   All other systems reviewed and are negative.   BP 110/84 (BP Location: Right Arm, Patient Position: Sitting, Cuff Size: Normal)   Temp 98.2 F (36.8 C) (Oral)   Ht 5\' 4"  (1.626 m)   Wt 143 lb 6.4 oz (65 kg)   BMI 24.61 kg/m   Physical Exam  Constitutional: She is oriented to person, place, and time and well-developed, well-nourished, and in no distress. No distress.  Cardiovascular: Normal rate, regular rhythm, normal heart sounds and intact distal pulses.  Exam reveals no gallop and no friction rub.   No murmur heard. Pulmonary/Chest: Effort normal and breath sounds normal. No respiratory distress. She has no wheezes. She has no rales. She exhibits no tenderness.  Abdominal: Soft. Normal appearance and bowel sounds are normal. She exhibits no distension and no mass. There is no tenderness. There is no rebound, no guarding and no CVA tenderness.  Neurological: She is alert and oriented to person, place, and time. Gait  normal. GCS score is 15.  Skin: Skin is warm and dry. No rash noted. She is not diaphoretic. No erythema. No pallor.  Psychiatric: Mood, memory, affect and judgment normal.  Nursing note and vitals reviewed.   Assessment/Plan: 1. Encounter to establish care - Follow up for AWE.  - Follow up sooner with any acute issues - Continue to stay active and exercise.   2.  Hyperlipidemia, unspecified hyperlipidemia type - Well controlled.   3. Essential hypertension - Well controlled. No change in medication   4. Bilateral low back pain without sciatica, unspecified chronicity - POC Urinalysis Dipstick- + leuks and blood   5. Post-menopausal  - DG Bone Density; Future  6. Abnormal urinalysis - Urine culture - sulfamethoxazole-trimethoprim (BACTRIM DS,SEPTRA DS) 800-160 MG tablet; Take 1 tablet by mouth 2 (two) times daily.  Dispense: 6 tablet; Refill: 0  Dorothyann Peng, NP

## 2016-12-19 NOTE — Patient Instructions (Addendum)
It was great meeting you today   Please follow up with Rachel Nichols for your annual medicare wellness exam   Follow up with me as needed  You do have a UTI. I have sent in a prescription for Septra DS. Take this twice a day for 3 days. Please take with food

## 2016-12-20 LAB — URINE CULTURE

## 2016-12-24 ENCOUNTER — Ambulatory Visit (INDEPENDENT_AMBULATORY_CARE_PROVIDER_SITE_OTHER)
Admission: RE | Admit: 2016-12-24 | Discharge: 2016-12-24 | Disposition: A | Discharge status: Home / Self Care | Payer: Medicare Other | Source: Ambulatory Visit | Attending: Adult Health | Admitting: Adult Health

## 2016-12-24 DIAGNOSIS — Z780 Asymptomatic menopausal state: Secondary | ICD-10-CM

## 2016-12-28 ENCOUNTER — Other Ambulatory Visit: Payer: Self-pay | Admitting: Adult Health

## 2016-12-28 DIAGNOSIS — R8290 Unspecified abnormal findings in urine: Secondary | ICD-10-CM

## 2017-01-15 NOTE — Progress Notes (Signed)
Subjective:   Rachel Nichols is a 83 y.o. female who presents for Medicare Annual (Subsequent) preventive examination.   The Patient was informed that the wellness visit is to identify future health risk and educate and initiate measures that can reduce risk for increased disease through the lifespan.    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 Good   Preventive Screening -Counseling & Management  Colonoscopy 10/2009- aged out  [place] 12/2014 - not having any more mammograms  Dexa 12/2016  -1.0 - takes Vit d and Calcium  Pap 12/2010  Smoking history- former smoker 2 pack q day x 15 years Quit 79;    Smokeless tobacco no Second Hand Smoke status; No Smokers in the home - no ETOH - no use   Medication adherence or issues?  [address] apt with [place]- no issues; cooks  Vegetables and fruits  c/o about weight around her waist Has given up [employer] / educated this  Could equate to 10 lbs weight loss in one year that would mean 1" reduction in waist size   Regular exercise  -  2 days moving in the water  13 steps to the basement in her home Laundry is downstairs   Cardiac Risk Factors:  MI in 1993 / one angioplasty Then walked 2 miles a day;  2012; stent; went through the [facility]  Advanced aged  >14 in  women Hyperlipidemia - 07/2016; 3.3 ratio;  Diabetes - glucose 106  Family History - stroke; MI; HTN; IBS   Obesity neg   Fall risk - good exercise program Tripped x 83 yo  Climb ladders is not advised  Mentioned ladders with platforms for any climbing for stabilization as balance is not as good  Given education on "Fall Prevention in the Home" for more safety tips the patient can apply as appropriate.  Long term goal is to "age in place"   Mobility of Functional changes this year? Lives in one story home with a basement  Safety discussed   Mental Health: states there is major addiction issue in niece and sister's family. Her dtr has had 2 to 3 marriages; also got on drugs; 2 boys are with her divorced; One of her Dtr's is trying to help.  Discussed detachment with love; as well as the uncontrollable nature of active addiction; Would recommend counseling to assist family members if needed, as well as groups in the community. Can call for further resources.    Any emotional problems? Anxious, depressed, irritable, sad or blue? no Denies feeling depressed or hopeless; voices pleasure in daily life How many social activities have you been engaged in within the last 2 weeks? no  Hearing Screening Comments: No hearing issues  2000hz  both ears Declines hearing screen  Vision Screening Comments: Vision checks x 1 time per year Implants in and does not wear glasses  Had laser post  No other issues Dr. Gershon Crane Jan 2017 eye exam is scheduled for 03/2017   Activities of Daily Living - See functional screen   Cognitive testing; Ad8 score reviewed for issues:  Issues making decisions:  Less interest in hobbies / activities:  Repeats questions, stories (family complaining):  Trouble using ordinary gadgets (microwave, computer, phone):  Forgets the month or year:   Mismanaging finances:   Remembering appts:  Daily problems with thinking and/or memory: Ad8 score is=  0   Advanced Directives completed   Patient Care Team: Dorothyann Peng, NP as PCP - General (Family Medicine) Troy Sine, MD as Consulting Physician (Cardiology) Caralee Ates, DDS as Consulting Physician (Dentistry) Rutherford Guys, MD as Consulting Physician (Ophthalmology) Jarome Matin, MD as Consulting Physician (Dermatology)    Immunization History  Administered Date(s) Administered  . Influenza Split 08/25/2012  . Influenza Whole 08/19/2008, 09/19/2009  . Influenza, High Dose Seasonal PF 08/20/2016  . Influenza,inj,Quad PF,36+ Mos 08/03/2013  . Influenza-Unspecified 08/09/2015  . Pneumococcal Conjugate-13 11/15/2015  . Pneumococcal Polysaccharide-23 04/14/2012  . Zoster 10/09/2011   Required Immunizations needed today  Screening test up to date or reviewed for plan of completion Health Maintenance Due  Topic Date Due  . TETANUS/TDAP  05/11/1952   To pharmacy for Tetanus;  Mammogram due    Declines future mammograms Postponed and can discuss with md regarding removing this from the screens.  Also states she had a TD within the last 10 years. Had at dr. [name] office. Will call today to see if I can get the report.     Objective:     Vitals: BP 104/60   Pulse (!) 55   Ht 5\' 4"  (1.626 m)   Wt 145 lb 8 oz (66 kg)   SpO2 96%   BMI 24.98 kg/m   Body mass index is 24.98 kg/m.   Tobacco History  Smoking Status  . Former Smoker  . Packs/day: 2.00  . Years: 15.00  . Types: Cigarettes  . Quit date: 10/09/1975  Smokeless Tobacco  . Never Used     Counseling given: Yes   Past Medical History:  Diagnosis Date  . Adenomatous colon polyp 2002   Colonoscopy   . Basal cell carcinoma    Nose  . Benign neoplasm of colon 2004   Colonoscopy  . CAD (coronary artery disease)    echo 09/13/10- EF>55%, aortic valve is mildly sclerotic; myoview 02-20-12-no ischemia  . Carotid bruit    carotid dopplers 05/16/04- normal  . Diverticulosis 2002,2004,2007    Colonoscopy  . Duodenitis   . Fibrocystic breast disease   . Gastritis   . GERD (gastroesophageal reflux disease)   . Heart attack 1993   Cardiolyte stress---  stents 2012  . Hiatal hernia   . History of blood transfusion    pt denies  . Hyperlipidemia   . Peptic ulcer due to Helicobacter pylori   . PUD (peptic ulcer disease)    Past Surgical History:  Procedure Laterality Date  . ANGIOPLASTY  1993  . APPENDECTOMY  1957  . BASAL CELL CARCINOMA EXCISION     From the nose  . CATARACT EXTRACTION  10/26/09   Right eye  . CATARACT EXTRACTION  11/09/09   Left eye  . CHOLECYSTECTOMY  1984  . CORONARY STENT PLACEMENT  01/10/11  hig grade ramus branch stenosis with mod LAD dz, PCI/stenting of ramus branch with a Resolute DES   . DILATION AND CURETTAGE OF UTERUS    . FINGER SURGERY  04/2011   Cyst removal, right hand forefinger  . LEFT OOPHORECTOMY  1957   Family History  Problem Relation Age of Onset  . Heart disease Brother 42    MI  . Heart disease Sister     stents  . Neuropathy Sister   . Hypertension Sister   . Stroke Mother   . Heart disease Father   . Heart attack Father   . Stroke Maternal Grandfather   . Heart attack Paternal Grandmother   . Heart attack Paternal Grandfather   . Breast cancer Maternal Aunt     x 2 aunts  . Irritable bowel syndrome Brother   . Heart attack Brother   . Epilepsy Daughter   . Heart attack Paternal Aunt     x 9  . Colon cancer Neg Hx    History  Sexual Activity  . Sexual activity: Not Currently  . Partners: Male    Outpatient Encounter Prescriptions as of 01/16/2017  Medication Sig  . aspirin 81 MG tablet Take 81 mg by mouth daily.  . cholecalciferol (VITAMIN D) 1000 UNITS tablet Take 2,000 Units by mouth daily.  . Coenzyme Q10 (CO Q10) 100 MG CAPS Take 200 mg by mouth daily.  . Cyanocobalamin (VITAMIN B 12 PO) Take 500 mg by mouth daily.  Marland Kitchen ezetimibe (ZETIA) 10 MG tablet TAKE 1 TABLET EVERY DAY  . metoprolol succinate  (TOPROL XL) 25 MG 24 hr tablet Take 0.5 tablets (12.5 mg total) by mouth daily.  . Multiple Vitamins-Minerals (CENTRUM SILVER ULTRA WOMENS) TABS Take 1 tablet by mouth daily.  . rosuvastatin (CRESTOR) 10 MG tablet Take 1 tablet (10 mg total) by mouth daily.  Marland Kitchen sulfamethoxazole-trimethoprim (BACTRIM DS,SEPTRA DS) 800-160 MG tablet Take 1 tablet by mouth 2 (two) times daily.   No facility-administered encounter medications on file as of 01/16/2017.     Activities of Daily Living In your present state of health, do you have any difficulty performing the following activities: 01/16/2017  Hearing? N  Vision? N  Difficulty concentrating or making decisions? N  Walking or climbing stairs? N  Dressing or bathing? N  Doing errands, shopping? N  Preparing Food and eating ? N  Using the Toilet? N  In the past six months, have you accidently leaked urine? Y  Do you have problems with loss of bowel control? N  Managing your Medications? N  Managing your Finances? N  Housekeeping or managing your Housekeeping? N  Some recent data might be hidden    Patient Care Team: Dorothyann Peng, NP as PCP - General (Family Medicine) Troy Sine, MD as Consulting Physician (Cardiology) Caralee Ates, DDS as Consulting Physician (Dentistry) Rutherford Guys, MD as Consulting Physician (Ophthalmology) Jarome Matin, MD as Consulting Physician (Dermatology)    Assessment:     Exercise Activities and Dietary recommendations    Goals    . patient          Keep engaged and loving your life     . patient          Remember: You didn't cause it You can't control it You can't cure it       Fall Risk Fall Risk  01/16/2017 12/19/2016 11/15/2015 08/13/2013  Falls in the past year? No No No Yes  Number falls in past  yr: - - - 1  Injury with Fall? - - - Yes   Depression Screen PHQ 2/9 Scores 01/16/2017 12/19/2016 11/15/2015 08/13/2013  PHQ - 2 Score 0 0 0 0     Cognitive Function MMSE - Mini Mental  State Exam 01/16/2017  Not completed: (No Data)        Immunization History  Administered Date(s) Administered  . Influenza Split 08/25/2012  . Influenza Whole 08/19/2008, 09/19/2009  . Influenza, High Dose Seasonal PF 08/20/2016  . Influenza,inj,Quad PF,36+ Mos 08/03/2013  . Influenza-Unspecified 08/09/2015  . Pneumococcal Conjugate-13 11/15/2015  . Pneumococcal Polysaccharide-23 04/14/2012  . Zoster 10/09/2011   Screening Tests Health Maintenance  Topic Date Due  . TETANUS/TDAP  05[birth year]11/1950  . MAMMOGRAM  01/17/2018 (Originally 01/04/2016)  . INFLUENZA VACCINE  05/08/2017  . DEXA SCAN  Completed  . PNA vac Low Risk Adult  Completed      Plan:        PCP Notes  Health Maintenance Had tetanus within the last 10 years, will try to get date. Educated regarding coverage to Tdap  Declines future mammograms; postponed x 1 year.  Abnormal Screens Hearing 2000; both ears   Patient concerns;  Discussed family issues of concern, overall very engaged and active and recommended detachment   Nurse Concerns; none  Next PCP apt     During the course of the visit the patient was educated and counseled about the following appropriate screening and preventive services:   Vaccines to include Pneumoccal, Influenza, Hepatitis B, Td, Zostavax, HCV  Electrocardiogram  Cardiovascular Disease  Colorectal cancer screening aged out  Bone density screening osteopenic but very active  Calcium 1200 and Vit d ongoing   Diabetes screening  Glaucoma screening regular eye exams  Mammography/ declines  Nutrition counseling   Patient Instructions (the written plan) was given to the patient.   Wynetta Fines, RN  01/16/2017

## 2017-01-16 ENCOUNTER — Ambulatory Visit (INDEPENDENT_AMBULATORY_CARE_PROVIDER_SITE_OTHER): Payer: Medicare Other

## 2017-01-16 VITALS — BP 104/60 | HR 55 | Ht 64.0 in | Wt 145.5 lb

## 2017-01-16 DIAGNOSIS — Z0000 Encounter for general adult medical examination without abnormal findings: Secondary | ICD-10-CM

## 2017-01-16 NOTE — Progress Notes (Signed)
I have reviewed and agree with this plan  

## 2017-01-16 NOTE — Patient Instructions (Addendum)
Rachel Nichols , Thank you for taking time to come for your Medicare Wellness Visit. I appreciate your ongoing commitment to your health goals. Please review the following plan we discussed and let me know if I can assist you in the future.   Will check eye exam with Dr. Richardean Sale  May research platform ladders if you are using for cleaning   GYN - last time 2012;  She did the TD; Rachel Nichols;   Rachel Nichols will check Dr. Pamala Hurry regarding tetanus A Tetanus is recommended every 10 years. Medicare covers a tetanus if you have a cut or wound; otherwise, there may be a charge. If you had not had a tetanus with pertusses, known as the Tdap, you can take this anytime.   May consider Shingrix which is a new shingles vaccine. It is an inactivated vaccine and comes in a series of 2 injections Covered under part D so you will take it at the pharmacy   These are the goals we discussed: Goals    . patient          Keep engaged and loving your life     . patient          Remember: You didn't cause it You can't control it You can't cure it        This is a list of the screening recommended for you and due dates:  Health Maintenance  Topic Date Due  . Tetanus Vaccine  05/11/1950  . Mammogram  01/04/2016  . Flu Shot  05/08/2017  . DEXA scan (bone density measurement)  Completed  . Pneumonia vaccines  Completed        Fall Prevention in the Home Falls can cause injuries. They can happen to people of all ages. There are many things you can do to make your home safe and to help prevent falls. What can I do on the outside of my home?  Regularly fix the edges of walkways and driveways and fix any cracks.  Remove anything that might make you trip as you walk through a door, such as a raised step or threshold.  Trim any bushes or trees on the path to your home.  Use bright outdoor lighting.  Clear any walking paths of anything that might make someone trip, such as rocks or  tools.  Regularly check to see if handrails are loose or broken. Make sure that both sides of any steps have handrails.  Any raised decks and porches should have guardrails on the edges.  Have any leaves, snow, or ice cleared regularly.  Use sand or salt on walking paths during winter.  Clean up any spills in your garage right away. This includes oil or grease spills. What can I do in the bathroom?  Use night lights.  Install grab bars by the toilet and in the tub and shower. Do not use towel bars as grab bars.  Use non-skid mats or decals in the tub or shower.  If you need to sit down in the shower, use a plastic, non-slip stool.  Keep the floor dry. Clean up any water that spills on the floor as soon as it happens.  Remove soap buildup in the tub or shower regularly.  Attach bath mats securely with double-sided non-slip rug tape.  Do not have throw rugs and other things on the floor that can make you trip. What can I do in the bedroom?  Use night lights.  Make sure that you have a  light by your bed that is easy to reach.  Do not use any sheets or blankets that are too big for your bed. They should not hang down onto the floor.  Have a firm chair that has side arms. You can use this for support while you get dressed.  Do not have throw rugs and other things on the floor that can make you trip. What can I do in the kitchen?  Clean up any spills right away.  Avoid walking on wet floors.  Keep items that you use a lot in easy-to-reach places.  If you need to reach something above you, use a strong step stool that has a grab bar.  Keep electrical cords out of the way.  Do not use floor polish or wax that makes floors slippery. If you must use wax, use non-skid floor wax.  Do not have throw rugs and other things on the floor that can make you trip. What can I do with my stairs?  Do not leave any items on the stairs.  Make sure that there are handrails on both  sides of the stairs and use them. Fix handrails that are broken or loose. Make sure that handrails are as long as the stairways.  Check any carpeting to make sure that it is firmly attached to the stairs. Fix any carpet that is loose or worn.  Avoid having throw rugs at the top or bottom of the stairs. If you do have throw rugs, attach them to the floor with carpet tape.  Make sure that you have a light switch at the top of the stairs and the bottom of the stairs. If you do not have them, ask someone to add them for you. What else can I do to help prevent falls?  Wear shoes that:  Do not have high heels.  Have rubber bottoms.  Are comfortable and fit you well.  Are closed at the toe. Do not wear sandals.  If you use a stepladder:  Make sure that it is fully opened. Do not climb a closed stepladder.  Make sure that both sides of the stepladder are locked into place.  Ask someone to hold it for you, if possible.  Clearly mark and make sure that you can see:  Any grab bars or handrails.  First and last steps.  Where the edge of each step is.  Use tools that help you move around (mobility aids) if they are needed. These include:  Canes.  Walkers.  Scooters.  Crutches.  Turn on the lights when you go into a dark area. Replace any light bulbs as soon as they burn out.  Set up your furniture so you have a clear path. Avoid moving your furniture around.  If any of your floors are uneven, fix them.  If there are any pets around you, be aware of where they are.  Review your medicines with your doctor. Some medicines can make you feel dizzy. This can increase your chance of falling. Ask your doctor what other things that you can do to help prevent falls. This information is not intended to replace advice given to you by your health care provider. Make sure you discuss any questions you have with your health care provider. Document Released: 07/21/2009 Document Revised:  03/01/2016 Document Reviewed: 10/29/2014 Elsevier Interactive Patient Education  2017 [place] Maintenance, Female Adopting a healthy lifestyle and getting preventive care can go a long way to promote health and wellness.  Talk with your health care provider about what schedule of regular examinations is right for you. This is a good chance for you to check in with your provider about disease prevention and staying healthy. In between checkups, there are plenty of things you can do on your own. Experts have done a lot of research about which lifestyle changes and preventive measures are most likely to keep you healthy. Ask your health care provider for more information. Weight and diet Eat a healthy diet  Be sure to include plenty of vegetables, fruits, low-fat dairy products, and lean protein.  Do not eat a lot of foods high in solid fats, added sugars, or salt.  Get regular exercise. This is one of the most important things you can do for your health.  Most adults should exercise for at least 150 minutes each week. The exercise should increase your heart rate and make you sweat (moderate-intensity exercise).  Most adults should also do strengthening exercises at least twice a week. This is in addition to the moderate-intensity exercise. Maintain a healthy weight  Body mass index (BMI) is a measurement that can be used to identify possible weight problems. It estimates body fat based on height and weight. Your health care provider can help determine your BMI and help you achieve or maintain a healthy weight.  For females 87 years of age and older:  A BMI below 18.5 is considered underweight.  A BMI of 18.5 to 24.9 is normal.  A BMI of 25 to 29.9 is considered overweight.  A BMI of 30 and above is considered obese. Watch levels of cholesterol and blood lipids  You should start having your blood tested for lipids and cholesterol at 81 years of age, then have this test  every 5 years.  You may need to have your cholesterol levels checked more often if:  Your lipid or cholesterol levels are high.  You are older than 81 years of age.  You are at high risk for heart disease. Cancer screening Lung Cancer  Lung cancer screening is recommended for adults 61-42 years old who are at high risk for lung cancer because of a history of smoking.  A yearly low-dose CT scan of the lungs is recommended for people who:  Currently smoke.  Have quit within the past 15 years.  Have at least a 30-pack-year history of smoking. A pack year is smoking an average of one pack of cigarettes a day for 1 year.  Yearly screening should continue until it has been 15 years since you quit.  Yearly screening should stop if you develop a health problem that would prevent you from having lung cancer treatment. Breast Cancer  Practice breast self-awareness. This means understanding how your breasts normally appear and feel.  It also means doing regular breast self-exams. Let your health care provider know about any changes, no matter how small.  If you are in your 20s or 30s, you should have a clinical breast exam (CBE) by a health care provider every 1-3 years as part of a regular health exam.  If you are 40 or older, have a CBE every year. Also consider having a breast X-ray (mammogram) every year.  If you have a family history of breast cancer, talk to your health care provider about genetic screening.  If you are at high risk for breast cancer, talk to your health care provider about having an MRI and a mammogram every year.  Breast cancer gene (BRCA) assessment is  recommended for women who have family members with BRCA-related cancers. BRCA-related cancers include:  Breast.  Ovarian.  Tubal.  Peritoneal cancers.  Results of the assessment will determine the need for genetic counseling and BRCA1 and BRCA2 testing. Cervical Cancer  Your health care provider may  recommend that you be screened regularly for cancer of the pelvic organs (ovaries, uterus, and vagina). This screening involves a pelvic examination, including checking for microscopic changes to the surface of your cervix (Pap test). You may be encouraged to have this screening done every 3 years, beginning at age 37.  For women ages 37-65, health care providers may recommend pelvic exams and Pap testing every 3 years, or they may recommend the Pap and pelvic exam, combined with testing for human papilloma virus (HPV), every 5 years. Some types of HPV increase your risk of cervical cancer. Testing for HPV may also be done on women of any age with unclear Pap test results.  Other health care providers may not recommend any screening for nonpregnant women who are considered low risk for pelvic cancer and who do not have symptoms. Ask your health care provider if a screening pelvic exam is right for you.  If you have had past treatment for cervical cancer or a condition that could lead to cancer, you need Pap tests and screening for cancer for at least 20 years after your treatment. If Pap tests have been discontinued, your risk factors (such as having a new sexual partner) need to be reassessed to determine if screening should resume. Some women have medical problems that increase the chance of getting cervical cancer. In these cases, your health care provider may recommend more frequent screening and Pap tests. Colorectal Cancer  This type of cancer can be detected and often prevented.  Routine colorectal cancer screening usually begins at 81 years of age and continues through 81 years of age.  Your health care provider may recommend screening at an earlier age if you have risk factors for colon cancer.  Your health care provider may also recommend using home test kits to check for hidden blood in the stool.  A small camera at the end of a tube can be used to examine your colon directly  (sigmoidoscopy or colonoscopy). This is done to check for the earliest forms of colorectal cancer.  Routine screening usually begins at age 52.  Direct examination of the colon should be repeated every 5-10 years through 81 years of age. However, you may need to be screened more often if early forms of precancerous polyps or small growths are found. Skin Cancer  Check your skin from head to toe regularly.  Tell your health care provider about any new moles or changes in moles, especially if there is a change in a mole's shape or color.  Also tell your health care provider if you have a mole that is larger than the size of a pencil eraser.  Always use sunscreen. Apply sunscreen liberally and repeatedly throughout the day.  Protect yourself by wearing long sleeves, pants, a wide-brimmed hat, and sunglasses whenever you are outside. Heart disease, diabetes, and high blood pressure  High blood pressure causes heart disease and increases the risk of stroke. High blood pressure is more likely to develop in:  People who have blood pressure in the high end of the normal range (130-139/85-89 mm Hg).  People who are overweight or obese.  People who are African American.  If you are 10-54 years of age, have  your blood pressure checked every 3-5 years. If you are 54 years of age or older, have your blood pressure checked every year. You should have your blood pressure measured twice-once when you are at a hospital or clinic, and once when you are not at a hospital or clinic. Record the average of the two measurements. To check your blood pressure when you are not at a hospital or clinic, you can use:  An automated blood pressure machine at a pharmacy.  A home blood pressure monitor.  If you are between 48 years and 57 years old, ask your health care provider if you should take aspirin to prevent strokes.  Have regular diabetes screenings. This involves taking a blood sample to check your  fasting blood sugar level.  If you are at a normal weight and have a low risk for diabetes, have this test once every three years after 81 years of age.  If you are overweight and have a high risk for diabetes, consider being tested at a younger age or more often. Preventing infection Hepatitis B  If you have a higher risk for hepatitis B, you should be screened for this virus. You are considered at high risk for hepatitis B if:  You were born in a country where hepatitis B is common. Ask your health care provider which countries are considered high risk.  Your parents were born in a high-risk country, and you have not been immunized against hepatitis B (hepatitis B vaccine).  You have HIV or AIDS.  You use needles to inject street drugs.  You live with someone who has hepatitis B.  You have had sex with someone who has hepatitis B.  You get hemodialysis treatment.  You take certain medicines for conditions, including cancer, organ transplantation, and autoimmune conditions. Hepatitis C  Blood testing is recommended for:  Everyone born from [birth year] through 1963.  Anyone with known risk factors for hepatitis C. Sexually transmitted infections (STIs)  You should be screened for sexually transmitted infections (STIs) including gonorrhea and chlamydia if:  You are sexually active and are younger than 81 years of age.  You are older than 81 years of age and your health care provider tells you that you are at risk for this type of infection.  Your sexual activity has changed since you were last screened and you are at an increased risk for chlamydia or gonorrhea. Ask your health care provider if you are at risk.  If you do not have HIV, but are at risk, it may be recommended that you take a prescription medicine daily to prevent HIV infection. This is called pre-exposure prophylaxis (PrEP). You are considered at risk if:  You are sexually active and do not regularly use condoms or  know the HIV status of your partner(s).  You take drugs by injection.  You are sexually active with a partner who has HIV. Talk with your health care provider about whether you are at high risk of being infected with HIV. If you choose to begin PrEP, you should first be tested for HIV. You should then be tested every 3 months for as long as you are taking PrEP. Pregnancy  If you are premenopausal and you may become pregnant, ask your health care provider about preconception counseling.  If you may become pregnant, take 400 to 800 micrograms (mcg) of folic acid every day.  If you want to prevent pregnancy, talk to your health care provider about birth control (contraception). Osteoporosis and  menopause  Osteoporosis is a disease in which the bones lose minerals and strength with aging. This can result in serious bone fractures. Your risk for osteoporosis can be identified using a bone density scan.  If you are 65 years of age or older, or if you are at risk for osteoporosis and fractures, ask your health care provider if you should be screened.  Ask your health care provider whether you should take a calcium or vitamin D supplement to lower your risk for osteoporosis.  Menopause may have certain physical symptoms and risks.  Hormone replacement therapy may reduce some of these symptoms and risks. Talk to your health care provider about whether hormone replacement therapy is right for you. Follow these instructions at home:  Schedule regular health, dental, and eye exams.  Stay current with your immunizations.  Do not use any tobacco products including cigarettes, chewing tobacco, or electronic cigarettes.  If you are pregnant, do not drink alcohol.  If you are breastfeeding, limit how much and how often you drink alcohol.  Limit alcohol intake to no more than 1 drink per day for nonpregnant women. One drink equals 12 ounces of beer, 5 ounces of wine, or 1 ounces of hard  liquor.  Do not use street drugs.  Do not share needles.  Ask your health care provider for help if you need support or information about quitting drugs.  Tell your health care provider if you often feel depressed.  Tell your health care provider if you have ever been abused or do not feel safe at home. This information is not intended to replace advice given to you by your health care provider. Make sure you discuss any questions you have with your health care provider. Document Released: 04/09/2011 Document Revised: 03/01/2016 Document Reviewed: 06/28/2015 Elsevier Interactive Patient Education  2017 [employer].

## 2017-01-18 ENCOUNTER — Telehealth: Payer: Self-pay

## 2017-01-18 NOTE — Telephone Encounter (Signed)
Patient stated she had a Tdap at Dr. [name] office Call to Dr. [name] office and confirmed Tdap given 12/19/2009

## 2017-03-11 ENCOUNTER — Encounter: Payer: Self-pay | Admitting: Family Medicine

## 2017-03-11 ENCOUNTER — Ambulatory Visit (INDEPENDENT_AMBULATORY_CARE_PROVIDER_SITE_OTHER): Payer: Medicare Other | Admitting: Family Medicine

## 2017-03-11 VITALS — BP 102/60 | HR 71 | Temp 97.9°F | Ht 64.0 in | Wt 143.4 lb

## 2017-03-11 DIAGNOSIS — J069 Acute upper respiratory infection, unspecified: Secondary | ICD-10-CM | POA: Diagnosis not present

## 2017-03-11 MED ORDER — BENZONATATE 100 MG PO CAPS: 100.0000 mg | ORAL_CAPSULE | Freq: Three times a day (TID) | ORAL | 0 refills | Status: DC | PRN

## 2017-03-11 NOTE — Progress Notes (Signed)
HPI:  URI: -started: 1 week ago -symptoms:nasal congestion, PND, cough - improving, but still has cough -denies:fever, SOB, NVD, tooth pain, wheezing -has tried:  musinex -sick contacts/travel/risks: no reported flu, strep or tick exposure  ROS: See pertinent positives and negatives per HPI.  Past Medical History:  Diagnosis Date  . Adenomatous colon polyp 2002   Colonoscopy   . Basal cell carcinoma    Nose  . Benign neoplasm of colon 2004   Colonoscopy  . CAD (coronary artery disease)    echo 09/13/10- EF>55%, aortic valve is mildly sclerotic; myoview 02-20-12-no ischemia  . Carotid bruit    carotid dopplers 05/16/04- normal  . Diverticulosis 2002,2004,2007   Colonoscopy  . Duodenitis   . Fibrocystic breast disease   . Gastritis   . GERD (gastroesophageal reflux disease)   . Heart attack ([place]) 1993   Cardiolyte stress---  stents 2012  . Hiatal hernia   . History of blood transfusion    pt denies  . Hyperlipidemia   . Peptic ulcer due to Helicobacter pylori   . PUD (peptic ulcer disease)     Past Surgical History:  Procedure Laterality Date  . ANGIOPLASTY  1993  . APPENDECTOMY  1957  . BASAL CELL CARCINOMA EXCISION     From the nose  . CATARACT EXTRACTION  10/26/09   Right eye  . CATARACT EXTRACTION  11/09/09   Left eye  . CHOLECYSTECTOMY  1984  . CORONARY STENT PLACEMENT  01/10/11   hig grade ramus branch stenosis with mod LAD dz, PCI/stenting of ramus branch with a Resolute DES   . DILATION AND CURETTAGE OF UTERUS    . FINGER SURGERY  04/2011   Cyst removal, right hand forefinger  . LEFT OOPHORECTOMY  1957    Family History  Problem Relation Age of Onset  . Heart disease Brother 29       MI  . Heart disease Sister        stents  . Neuropathy Sister   . Hypertension Sister   . Stroke Mother   . Heart disease Father   . Heart attack Father   . Stroke Maternal Grandfather   . Heart attack Paternal Grandmother   . Heart attack Paternal Grandfather   .  Breast cancer Maternal Aunt        x 2 aunts  . Irritable bowel syndrome Brother   . Heart attack Brother   . Epilepsy Daughter   . Heart attack Paternal Aunt        x 9  . Colon cancer Neg Hx     Social History   Social History  . Marital status: Married    Spouse name: N/A  . Number of children: 2  . Years of education: N/A   Occupational History  . retired Radiation protection practitioner Retired   Social History Main Topics  . Smoking status: Former Smoker    Packs/day: 2.00    Years: 15.00    Types: Cigarettes    Quit date: 10/09/1975  . Smokeless tobacco: Never Used  . Alcohol use No     Comment: none  . Drug use: No  . Sexual activity: Not Currently    Partners: Male   Other Topics Concern  . None   Social History Narrative   Exercise--  Gym 3 days a week,   2 days swim     Current Outpatient Prescriptions:  .  aspirin 81 MG tablet, Take 81 mg by mouth daily., Disp: ,  Rfl:  .  cholecalciferol (VITAMIN D) 1000 UNITS tablet, Take 2,000 Units by mouth daily., Disp: , Rfl:  .  Coenzyme Q10 (CO Q10) 100 MG CAPS, Take 200 mg by mouth daily., Disp: , Rfl:  .  Cyanocobalamin (VITAMIN B 12 PO), Take 500 mg by mouth daily., Disp: , Rfl:  .  ezetimibe (ZETIA) 10 MG tablet, TAKE 1 TABLET EVERY DAY, Disp: 90 tablet, Rfl: 3 .  metoprolol succinate (TOPROL XL) 25 MG 24 hr tablet, Take 0.5 tablets (12.5 mg total) by mouth daily., Disp: 45 tablet, Rfl: 2 .  Multiple Vitamins-Minerals (CENTRUM SILVER ULTRA WOMENS) TABS, Take 1 tablet by mouth daily., Disp: , Rfl:  .  rosuvastatin (CRESTOR) 10 MG tablet, Take 1 tablet (10 mg total) by mouth daily., Disp: 90 tablet, Rfl: 2 .  benzonatate (TESSALON PERLES) 100 MG capsule, Take 1 capsule (100 mg total) by mouth 3 (three) times daily as needed for cough., Disp: 20 capsule, Rfl: 0  EXAM:  Vitals:   03/11/17 1021  BP: 102/60  Pulse: 71  Temp: 97.9 F (36.6 C)    Body mass index is 24.61 kg/m.  GENERAL: vitals reviewed and listed above,  alert, oriented, appears well hydrated and in no acute distress  HEENT: atraumatic, conjunttiva clear, no obvious abnormalities on inspection of external nose and ears, normal appearance of ear canals and TMs, clear nasal congestion, mild post oropharyngeal erythema with PND, no tonsillar edema or exudate, no sinus TTP  NECK: no obvious masses on inspection  LUNGS: clear to auscultation bilaterally, no wheezes, rales or rhonchi, good air movement  CV: HRRR, no peripheral edema  MS: moves all extremities without noticeable abnormality  PSYCH: pleasant and cooperative, no obvious depression or anxiety  ASSESSMENT AND PLAN:  Discussed the following assessment and plan:  Viral upper respiratory tract infection  -given HPI and exam findings today, a serious infection or illness is unlikely. We discussed potential etiologies, with VURI being most likely, and advised supportive care and monitoring. She would like a prescription for the cough and we will try tessalon. We discussed treatment side effects, likely course, antibiotic misuse, transmission, and signs of developing a serious illness. -of course, we advised to return or notify a doctor immediately if symptoms worsen or persist or new concerns arise.    Patient Instructions  INSTRUCTIONS FOR UPPER RESPIRATORY INFECTION:   -if you are taking a cough medication - use only as directed, may also try a teaspoon of honey to coat the throat and throat lozenges. I sent the tessalon to the pharmacy to try for the cough.  -for sore throat, salt water gargles can help  -follow up if you have fevers, facial pain, tooth pain, difficulty breathing or are worsening or symptoms persist longer then expected  Upper Respiratory Infection, Adult An upper respiratory infection (URI) is also known as the common cold. It is often caused by a type of germ (virus). Colds are easily spread (contagious). You can pass it to others by kissing, coughing,  sneezing, or drinking out of the same glass. Usually, you get better in 1 to 3  weeks.  However, the cough can last for even longer. HOME CARE   Only take medicine as told by your doctor. Follow instructions provided above.  Drink enough water and fluids to keep your pee (urine) clear or pale yellow.  Get plenty of rest.  Return to work when your temperature is < 100 for 24 hours or as told by your doctor.  You may use a face mask and wash your hands to stop your cold from spreading. GET HELP RIGHT AWAY IF:   After the first few days, you feel you are getting worse.  You have questions about your medicine.  You have chills, shortness of breath, or red spit (mucus).  You have pain in the face for more then 1-2 days, especially when you bend forward.  You have a fever, puffy (swollen) neck, pain when you swallow, or white spots in the back of your throat.  You have a bad headache, ear pain, sinus pain, or chest pain.  You have a high-pitched whistling sound when you breathe in and out (wheezing).  You cough up blood.  You have sore muscles or a stiff neck. MAKE SURE YOU:   Understand these instructions.  Will watch your condition.  Will get help right away if you are not doing well or get worse. Document Released: 03/12/2008 Document Revised: 12/17/2011 Document Reviewed: 12/30/2013 [facility] Patient Information 2015 [place], [place]. This information is not intended to replace advice given to you by your health care provider. Make sure you discuss any questions you have with your health care provider.    Colin Benton R., DO

## 2017-03-11 NOTE — Patient Instructions (Signed)
INSTRUCTIONS FOR UPPER RESPIRATORY INFECTION:   -if you are taking a cough medication - use only as directed, may also try a teaspoon of honey to coat the throat and throat lozenges. I sent the tessalon to the pharmacy to try for the cough.  -for sore throat, salt water gargles can help  -follow up if you have fevers, facial pain, tooth pain, difficulty breathing or are worsening or symptoms persist longer then expected  Upper Respiratory Infection, Adult An upper respiratory infection (URI) is also known as the common cold. It is often caused by a type of germ (virus). Colds are easily spread (contagious). You can pass it to others by kissing, coughing, sneezing, or drinking out of the same glass. Usually, you get better in 1 to 3  weeks.  However, the cough can last for even longer. HOME CARE   Only take medicine as told by your doctor. Follow instructions provided above.  Drink enough water and fluids to keep your pee (urine) clear or pale yellow.  Get plenty of rest.  Return to work when your temperature is < 100 for 24 hours or as told by your doctor. You may use a face mask and wash your hands to stop your cold from spreading. GET HELP RIGHT AWAY IF:   After the first few days, you feel you are getting worse.  You have questions about your medicine.  You have chills, shortness of breath, or red spit (mucus).  You have pain in the face for more then 1-2 days, especially when you bend forward.  You have a fever, puffy (swollen) neck, pain when you swallow, or white spots in the back of your throat.  You have a bad headache, ear pain, sinus pain, or chest pain.  You have a high-pitched whistling sound when you breathe in and out (wheezing).  You cough up blood.  You have sore muscles or a stiff neck. MAKE SURE YOU:   Understand these instructions.  Will watch your condition.  Will get help right away if you are not doing well or get worse. Document Released:  03/12/2008 Document Revised: 12/17/2011 Document Reviewed: 12/30/2013 [facility] Patient Information 2015 [place], [place]. This information is not intended to replace advice given to you by your health care provider. Make sure you discuss any questions you have with your health care provider.

## 2017-03-14 DIAGNOSIS — Z961 Presence of intraocular lens: Secondary | ICD-10-CM | POA: Diagnosis not present

## 2017-06-24 ENCOUNTER — Other Ambulatory Visit: Payer: Self-pay | Admitting: Cardiovascular Disease

## 2017-07-02 DIAGNOSIS — S300XXA Contusion of lower back and pelvis, initial encounter: Secondary | ICD-10-CM | POA: Diagnosis not present

## 2017-07-02 DIAGNOSIS — M25551 Pain in right hip: Secondary | ICD-10-CM | POA: Diagnosis not present

## 2017-07-09 DIAGNOSIS — C44622 Squamous cell carcinoma of skin of right upper limb, including shoulder: Secondary | ICD-10-CM | POA: Diagnosis not present

## 2017-07-09 DIAGNOSIS — D485 Neoplasm of uncertain behavior of skin: Secondary | ICD-10-CM | POA: Diagnosis not present

## 2017-07-09 DIAGNOSIS — M7130 Other bursal cyst, unspecified site: Secondary | ICD-10-CM | POA: Diagnosis not present

## 2017-07-09 DIAGNOSIS — L218 Other seborrheic dermatitis: Secondary | ICD-10-CM | POA: Diagnosis not present

## 2017-07-09 DIAGNOSIS — Z85828 Personal history of other malignant neoplasm of skin: Secondary | ICD-10-CM | POA: Diagnosis not present

## 2017-07-26 ENCOUNTER — Encounter (INDEPENDENT_AMBULATORY_CARE_PROVIDER_SITE_OTHER): Payer: Self-pay

## 2017-07-26 ENCOUNTER — Ambulatory Visit (INDEPENDENT_AMBULATORY_CARE_PROVIDER_SITE_OTHER): Payer: Medicare Other | Admitting: Cardiovascular Disease

## 2017-07-26 ENCOUNTER — Encounter: Payer: Self-pay | Admitting: Cardiovascular Disease

## 2017-07-26 VITALS — BP 120/74 | HR 74 | Ht 63.0 in | Wt 144.0 lb

## 2017-07-26 DIAGNOSIS — I10 Essential (primary) hypertension: Secondary | ICD-10-CM

## 2017-07-26 DIAGNOSIS — Z79899 Other long term (current) drug therapy: Secondary | ICD-10-CM

## 2017-07-26 DIAGNOSIS — I2510 Atherosclerotic heart disease of native coronary artery without angina pectoris: Secondary | ICD-10-CM

## 2017-07-26 DIAGNOSIS — E785 Hyperlipidemia, unspecified: Secondary | ICD-10-CM

## 2017-07-26 DIAGNOSIS — I2583 Coronary atherosclerosis due to lipid rich plaque: Secondary | ICD-10-CM | POA: Diagnosis not present

## 2017-07-26 MED ORDER — ROSUVASTATIN CALCIUM 10 MG PO TABS: 5.0000 mg | ORAL_TABLET | Freq: Every day | ORAL | 0 refills | Status: DC

## 2017-07-26 NOTE — Progress Notes (Signed)
Patient ID: CARELI LUZADER, female   DOB: Oct 11, 1930, 83 y.o.   MRN: [id number]       HPI: DARRIS CARACHURE, is a 83 y.o. female who presents to the office for a 12 month cardiology evaluation.  Ms. Selleck is  suffered a myocardial infarction in January 1993 and underwent PTCA of her RCA. In April 2012, a 2.5x12 mm Resolute DES stent was inserted to the proximal portion of a ramus intermediate vessel. She  had 60% LAD stenosis after diagonal vessel as well as 40-50% mid right coronary artery stenosis for which she's been treated medically. A myocardial perfusion study in May 2013  remained normal. She has continued to remain stable on her current medical regimen. We had increased her Crestor to 10 mg to take in addition to this area there LDL particle concentration was still elevated at 1387. Subsequent blood work on 11/10/2012 showed marked improvement with her LDL now at 61 and LDL particle number at 1027 the total cholesterol was 126, HDL 43, triglycerides 108. Her insulin resistance score was still slightly elevated at 56.  WhenI saw her in April 2016 for 18 month follow-up evaluation, her cardiac murmur was more pronounced.  I scheduled her for an echo Doppler study.  This was done on 06/27/2015.  LV function was normal with an ejection fraction of 65-70%.  There was grade 1 diastolic dysfunction.  There were no wall motion abnormalities.  She had mild left atrial dilatation.  She was not found to have significant valvular pathology.   Since I last saw her in October 2017, she has contiued to do well.  Specifically, she denies an any chest pain or palpitations.  she continues to be active and swims at least several days per week.  She denies PND, orthopnea.  She denies dizziness.  She recently had a dermatologic procedure to remove a squamous cell carcinoma of her right forearm.  She continues to be on metoprolol succinate 12.5 mg for CAD.  She had reduced her rosuvastatin from 10 mg and apparently is only  taking 5 mg daily, but continues to take Zetia 10 mg.  She has noted some mild myalgias on the higher dose and also takes coenzyme Q10.  She presents for one-year evaluation.  Past Medical History:  Diagnosis Date  . Adenomatous colon polyp 2002   Colonoscopy   . Basal cell carcinoma    Nose  . Benign neoplasm of colon 2004   Colonoscopy  . CAD (coronary artery disease)    echo 09/13/10- EF>55%, aortic valve is mildly sclerotic; myoview 02-20-12-no ischemia  . Carotid bruit    carotid dopplers 05/16/04- normal  . Diverticulosis 2002,2004,2007   Colonoscopy  . Duodenitis   . Fibrocystic breast disease   . Gastritis   . GERD (gastroesophageal reflux disease)   . Heart attack ([place]) 1993   Cardiolyte stress---  stents 2012  . Hiatal hernia   . History of blood transfusion    pt denies  . Hyperlipidemia   . Peptic ulcer due to Helicobacter pylori   . PUD (peptic ulcer disease)     Past Surgical History:  Procedure Laterality Date  . ANGIOPLASTY  1993  . APPENDECTOMY  1957  . BASAL CELL CARCINOMA EXCISION     From the nose  . CATARACT EXTRACTION  10/26/09   Right eye  . CATARACT EXTRACTION  11/09/09   Left eye  . CHOLECYSTECTOMY  1984  . CORONARY STENT PLACEMENT  01/10/11   hig  grade ramus branch stenosis with mod LAD dz, PCI/stenting of ramus branch with a Resolute DES   . DILATION AND CURETTAGE OF UTERUS    . FINGER SURGERY  04/2011   Cyst removal, right hand forefinger  . LEFT OOPHORECTOMY  1957    Allergies  Allergen Reactions  . Diltiazem Hcl   . Tape Other (See Comments)    It pulls the skin off.  . Tramadol Hcl     Current Outpatient Prescriptions  Medication Sig Dispense Refill  . aspirin 81 MG tablet Take 81 mg by mouth daily.    . cholecalciferol (VITAMIN D) 1000 UNITS tablet Take 2,000 Units by mouth daily.    . Coenzyme Q10 (CO Q10) 100 MG CAPS Take 200 mg by mouth daily.    . Cyanocobalamin (VITAMIN B 12 PO) Take 500 mg by mouth daily.    Marland Kitchen ezetimibe  (ZETIA) 10 MG tablet TAKE 1 TABLET EVERY DAY 90 tablet 3  . metoprolol succinate (TOPROL-XL) 25 MG 24 hr tablet TAKE 1/2 TABLET EVERY DAY 45 tablet 0  . Multiple Vitamins-Minerals (CENTRUM SILVER ULTRA WOMENS) TABS Take 1 tablet by mouth daily.    . rosuvastatin (CRESTOR) 10 MG tablet Take 0.5 tablets (5 mg total) by mouth daily. 90 tablet 0   No current facility-administered medications for this visit.    Socially she is married. Has one child who is living and one child who is deceased. She has 3 grandchildren. There is no tobacco or alcohol use.  ROS General: Negative; No fevers, chills, or night sweats HEENT: Negative; No changes in vision or hearing, sinus congestion, difficulty swallowing Pulmonary: Negative; No cough, wheezing, shortness of breath, hemoptysis Cardiovascular: Negative; No chest pain, presyncope, syncope, palpitations GI: Negative; No nausea, vomiting, diarrhea, or abdominal pain GU: Negative; No dysuria, hematuria, or difficulty voiding Musculoskeletal: Intermittent cramps in her legs and feet. Hematologic/Oncology: Negative; no easy bruising, bleeding Endocrine: Negative; no heat/cold intolerance; no diabetes Neuro: Negative; no changes in balance, headaches Skin: Negative; No rashes or skin lesions Psychiatric: Negative; No behavioral problems, depression Sleep: Negative; No snoring, daytime sleepiness, hypersomnolence, bruxism, restless legs, hypnogognic hallucinations, no cataplexy Other comprehensive 14 point system review is negative.   PE BP 120/74   Pulse 74   Ht 5\' 3"  (1.6 m)   Wt 144 lb (65.3 kg)   BMI 25.51 kg/m    Repeat blood pressure by me was 120/76  Wt Readings from Last 3 Encounters:  07/26/17 144 lb (65.3 kg)  03/11/17 143 lb 6.4 oz (65 kg)  01/16/17 145 lb 8 oz (66 kg)   General: Alert, oriented, no distress.  Skin: normal turgor, no rashes, warm and dry HEENT: Normocephalic, atraumatic. Pupils equal round and reactive to light;  sclera anicteric; extraocular muscles intact;  Nose without nasal septal hypertrophy Mouth/Parynx benign; Mallinpatti scale 2 Neck: No JVD, no carotid bruits; normal carotid upstroke Lungs: clear to ausculatation and percussion; no wheezing or rales Chest wall: without tenderness to palpitation Heart: PMI not displaced, RRR, s1 s2 normal, 1/6 systolic murmur, no diastolic murmur, no rubs, gallops, thrills, or heaves Abdomen: soft, nontender; no hepatosplenomehaly, BS+; abdominal aorta nontender and not dilated by palpation. Back: no CVA tenderness Pulses 2+ Musculoskeletal: full range of motion, normal strength, no joint deformities Extremities: Bandage on her right forearm ; no clubbing cyanosis or edema, Homan's sign negative  Neurologic: grossly nonfocal; Cranial nerves grossly wnl Psychologic: Normal mood and affect   ECG (independently read by me): Normal sinus rhythm  at 74 bpm.  Left axis deviation.  QTc interval 472 ms.  October 2017 ECG (independently read by me): Sinus bradycardia 56 bpm.  Normal intervals.  No ectopy.  October 2016 ECG (independently read by me): Sinus bradycardia at 55 bpm.  No ectopy.  Normal intervals.  April 2016 ECG (independently read by me): Sinus bradycardia 59 bpm.  No ectopy.  Pior ECG: Sinus rhythm at 53 beats per minute. PR interval 166 ms QTc interval 426 ms.  LABS  BMP Latest Ref Rng & Units 08/02/2016 01/17/2015 04/22/2014  Glucose 65 - 99 mg/dL 106(H) 96 -  BUN 7 - 25 mg/dL 14 14 -  Creatinine 0.60 - 0.88 mg/dL 0.93(H) 0.81 0.90  Sodium 135 - 146 mmol/L 141 141 -  Potassium 3.5 - 5.3 mmol/L 4.3 4.4 -  Chloride 98 - 110 mmol/L 107 105 -  CO2 20 - 31 mmol/L 26 29 -  Calcium 8.6 - 10.4 mg/dL 10.1 9.9 -      Component Value Date/Time   PROT 7.1 08/02/2016 0956   ALBUMIN 4.2 08/02/2016 0956   AST 28 08/02/2016 0956   ALT 31 (H) 08/02/2016 0956   ALKPHOS 45 08/02/2016 0956   BILITOT 0.6 08/02/2016 0956   BILIDIR 0.1 01/11/2014 1507     CBC Latest Ref Rng & Units 08/02/2016 01/17/2015 01/11/2014  WBC 3.8 - 10.8 K/uL 4.7 5.7 7.8  Hemoglobin 11.7 - 15.5 g/dL 14.6 13.9 13.8  Hematocrit 35.0 - 45.0 % 44.2 42.4 40.3  Platelets 140 - 400 K/uL 164 182 182.0   Lab Results  Component Value Date   MCV 97.1 08/02/2016   MCV 95.1 01/17/2015   MCV 94.2 01/11/2014   Lab Results  Component Value Date   TSH 1.60 08/02/2016   Lab Results  Component Value Date   HGBA1C 6.5 08/13/2013   Lipid Panel     Component Value Date/Time   CHOL 149 08/02/2016 0956   TRIG 146 08/02/2016 0956   HDL 45 (L) 08/02/2016 0956   CHOLHDL 3.3 08/02/2016 0956   VLDL 29 08/02/2016 0956   LDLCALC 75 08/02/2016 0956     BNP    Component Value Date/Time   PROBNP 86.0 01/10/2011 1357    Lipid Panel     Component Value Date/Time   CHOL 149 08/02/2016 0956   TRIG 146 08/02/2016 0956   HDL 45 (L) 08/02/2016 0956   CHOLHDL 3.3 08/02/2016 0956   VLDL 29 08/02/2016 0956   LDLCALC 75 08/02/2016 0956     RADIOLOGY: No results found.  IMPRESSION: 1. Coronary artery disease due to lipid rich plaque   2. Essential hypertension   3. Hyperlipidemia LDL goal <70   4. Medication management     ASSESSMENT AND PLAN: Ms. Scoville is a Young appearing 83 year old Caucasian female who is 25 years status post her initial myocardial infarction which time she underwent PTCA of RCA in 1993.  In 2012.  She underwent successful DES stenting of the ramus intermediate vessel and had concomitant 60% LAD stenosis after diagonal vessel and 40-50% mid RCA stenoses.  She has continued to be angina free on metoprolol succinate at 12.5 mg daily.  Her ECG remained stable with a resting pulse in the 70s.  Continues to exercise several days per week and denies any exertional symptomatology.  Previously she had been on a higher dose of Crestor and several years ago.  Her LDLs have been in the 40-60% range.  Her last laboratory 11 months ago showed  an LDL of 75.  She is  fasting today.  She has not had laboratory since last year.  One fasting glucose was minimally increased relative check completes her laboratory today.  I will contact her regarding results and adjustments to her medical regimen will be made.  She continues to be on aspirin for antiplatelet benefit.  As long as she remains stable, I will see her in one year for reevaluation. Time spent: 25 minutes  Troy Sine, MD, [facility]  07/26/2017 9:16 AM

## 2017-07-26 NOTE — Patient Instructions (Signed)
Medication Instructions:  Your physician recommends that you continue on your current medications as directed. Please refer to the Current Medication list given to you today.  Labwork: TODAY (CBC, CMET, TSH, Lipid, Hmg A1C)  Follow-Up: Your physician wants you to follow-up in: 12 months with Dr. Claiborne Billings.  You will receive a reminder letter in the mail two months in advance. If you don't receive a letter, please call our office to schedule the follow-up appointment.   Any Other Special Instructions Will Be Listed Below (If Applicable).     If you need a refill on your cardiac medications before your next appointment, please call your pharmacy.

## 2017-07-27 LAB — COMPREHENSIVE METABOLIC PANEL
A/G RATIO: 1.6 (ref 1.2–2.2)
ALK PHOS: 53 IU/L (ref 39–117)
ALT: 34 IU/L — ABNORMAL HIGH (ref 0–32)
AST: 29 IU/L (ref 0–40)
Albumin: 4.4 g/dL (ref 3.5–4.7)
BILIRUBIN TOTAL: 0.4 mg/dL (ref 0.0–1.2)
BUN/Creatinine Ratio: 14 (ref 12–28)
BUN: 12 mg/dL (ref 8–27)
CHLORIDE: 103 mmol/L (ref 96–106)
CO2: 24 mmol/L (ref 20–29)
Calcium: 9.9 mg/dL (ref 8.7–10.3)
Creatinine, Ser: 0.83 mg/dL (ref 0.57–1.00)
GFR calc Af Amer: 75 mL/min/{1.73_m2} (ref >59)
GFR, EST NON AFRICAN AMERICAN: 65 mL/min/{1.73_m2} (ref >59)
GLOBULIN, TOTAL: 2.7 g/dL (ref 1.5–4.5)
Glucose: 106 mg/dL — ABNORMAL HIGH (ref 65–99)
POTASSIUM: 4.3 mmol/L (ref 3.5–5.2)
SODIUM: 143 mmol/L (ref 134–144)
Total Protein: 7.1 g/dL (ref 6.0–8.5)

## 2017-07-27 LAB — CBC
HEMATOCRIT: 43 % (ref 34.0–46.6)
Hemoglobin: 14.3 g/dL (ref 11.1–15.9)
MCH: 31.4 pg (ref 26.6–33.0)
MCHC: 33.3 g/dL (ref 31.5–35.7)
MCV: 94 fL (ref 79–97)
Platelets: 182 10*3/uL (ref 150–379)
RBC: 4.56 x10E6/uL (ref 3.77–5.28)
RDW: 13.8 % (ref 12.3–15.4)
WBC: 5 10*3/uL (ref 3.4–10.8)

## 2017-07-27 LAB — HEMOGLOBIN A1C
Est. average glucose Bld gHb Est-mCnc: 134 mg/dL
HEMOGLOBIN A1C: 6.3 % — AB (ref 4.8–5.6)

## 2017-07-27 LAB — LIPID PANEL
CHOL/HDL RATIO: 3 ratio (ref 0.0–4.4)
Cholesterol, Total: 130 mg/dL (ref 100–199)
HDL: 44 mg/dL (ref >39)
LDL Calculated: 53 mg/dL (ref 0–99)
TRIGLYCERIDES: 164 mg/dL — AB (ref 0–149)
VLDL Cholesterol Cal: 33 mg/dL (ref 5–40)

## 2017-07-27 LAB — TSH: TSH: 2.88 u[IU]/mL (ref 0.450–4.500)

## 2017-07-31 ENCOUNTER — Encounter: Payer: Self-pay | Admitting: Cardiovascular Disease

## 2017-08-14 DIAGNOSIS — Z23 Encounter for immunization: Secondary | ICD-10-CM | POA: Diagnosis not present

## 2017-08-26 ENCOUNTER — Other Ambulatory Visit: Payer: Self-pay | Admitting: Cardiovascular Disease

## 2017-08-27 NOTE — Telephone Encounter (Signed)
REFILL 

## 2017-09-05 ENCOUNTER — Other Ambulatory Visit: Payer: Self-pay | Admitting: Cardiovascular Disease

## 2017-09-05 DIAGNOSIS — E785 Hyperlipidemia, unspecified: Secondary | ICD-10-CM

## 2017-09-17 ENCOUNTER — Ambulatory Visit (INDEPENDENT_AMBULATORY_CARE_PROVIDER_SITE_OTHER): Payer: Medicare Other | Admitting: Sports Medicine

## 2017-09-17 ENCOUNTER — Ambulatory Visit (INDEPENDENT_AMBULATORY_CARE_PROVIDER_SITE_OTHER): Payer: Medicare Other

## 2017-09-17 ENCOUNTER — Ambulatory Visit: Payer: Medicare Other | Admitting: Podiatry

## 2017-09-17 ENCOUNTER — Encounter: Payer: Self-pay | Admitting: Sports Medicine

## 2017-09-17 VITALS — BP 105/62 | HR 73

## 2017-09-17 DIAGNOSIS — I2510 Atherosclerotic heart disease of native coronary artery without angina pectoris: Secondary | ICD-10-CM

## 2017-09-17 DIAGNOSIS — M2040 Other hammer toe(s) (acquired), unspecified foot: Secondary | ICD-10-CM

## 2017-09-17 DIAGNOSIS — I2583 Coronary atherosclerosis due to lipid rich plaque: Secondary | ICD-10-CM

## 2017-09-17 DIAGNOSIS — M778 Other enthesopathies, not elsewhere classified: Secondary | ICD-10-CM

## 2017-09-17 DIAGNOSIS — M7752 Other enthesopathy of left foot: Secondary | ICD-10-CM

## 2017-09-17 DIAGNOSIS — M779 Enthesopathy, unspecified: Principal | ICD-10-CM

## 2017-09-17 MED ORDER — TRIAMCINOLONE ACETONIDE 10 MG/ML IJ SUSP: 10.0000 mg | Freq: Once | INTRAMUSCULAR | Status: DC

## 2017-09-17 NOTE — Progress Notes (Signed)
Subjective: EFRATA BRUNNER is a 83 y.o. female patient who presents to office for evaluation of Left foot pain and throbbing at 4-5 toes x 1 months that comes and goes. Patient has tried bandaid and cotton with no relief in symptoms. Patient denies any other pedal complaints. Denies injury/trip/fall/sprain/any causative factors.   Patient Active Problem List   Diagnosis Date Noted  . Hyperlipidemia LDL goal <70 01/13/2015  . Cardiac murmur 01/13/2015  . RUQ abdominal pain 01/22/2014  . Pancreatic cyst 01/22/2014  . Essential hypertension 03/06/2013  . CAD (coronary artery disease) 04/14/2012  . GASTRITIS 10/19/2009  . ABDOMINAL PAIN-EPIGASTRIC 10/18/2009  . CHOLECYSTECTOMY, HX OF 10/18/2009  . DIVERTICULOSIS, COLON 10/14/2009  . COLONIC POLYPS, ADENOMATOUS, HX OF 10/14/2009  . HIATAL HERNIA WITH REFLUX 09/19/2009  . WEIGHT LOSS 09/19/2009  . PUD, HX OF 09/19/2009  . Hyperlipidemia 05/27/2008  . BACK PAIN 05/27/2008    Current Outpatient Medications on File Prior to Visit  Medication Sig Dispense Refill  . aspirin 81 MG tablet Take 81 mg by mouth daily.    . cholecalciferol (VITAMIN D) 1000 UNITS tablet Take 2,000 Units by mouth daily.    . Coenzyme Q10 (CO Q10) 100 MG CAPS Take 200 mg by mouth daily.    . Cyanocobalamin (VITAMIN B 12 PO) Take 500 mg by mouth daily.    Marland Kitchen ezetimibe (ZETIA) 10 MG tablet Take 1 tablet (10 mg total) by mouth daily. 90 tablet 3  . metoprolol succinate (TOPROL-XL) 25 MG 24 hr tablet TAKE 1/2 TABLET EVERY DAY 45 tablet 3  . Multiple Vitamins-Minerals (CENTRUM SILVER ULTRA WOMENS) TABS Take 1 tablet by mouth daily.    . rosuvastatin (CRESTOR) 10 MG tablet TAKE 1 TABLET EVERY DAY 90 tablet 3   No current facility-administered medications on file prior to visit.     Allergies  Allergen Reactions  . Diltiazem Hcl   . Tape Other (See Comments)    It pulls the skin off.  . Tramadol Hcl     Objective:  General: Alert and oriented x3 in no acute  distress  Dermatology: No open lesions bilateral lower extremities, no webspace macerations, no ecchymosis bilateral, all nails x 10 are well manicured.  Vascular: Dorsalis Pedis and Posterior Tibial pedal pulses palpable, Capillary Fill Time 3 seconds,(+) decreased pedal hair growth bilateral, no edema bilateral lower extremities, Temperature gradient within normal limits.  Neurology: Johney Maine sensation intact via light touch bilateral. (- )Tinels sign bilateral.   Musculoskeletal: Mild tenderness with palpation at left 4-5 toes at hammertoes with varus and bunionette,+ fat pad atrophy, Strength within normal limits in all groups bilateral.   Xrays  Left  Foot   Impression: No fracture, Varus rotated 5th hammertoe with narrowing 4th intermetarsal space at larger 5th met head with bunionette deformity.   Assessment and Plan: Problem List Items Addressed This Visit    None    Visit Diagnoses    Capsulitis of foot, left    -  Primary   Relevant Medications   triamcinolone acetonide (KENALOG) 10 MG/ML injection 10 mg   Hammer toe, unspecified laterality       Relevant Orders   DG Foot Complete Left      -Complete examination performed -Xrays reviewed -Discussed treatement options for likely capsulitis secondary to hammertoe deformity  After oral consent and aseptic prep, injected a mixture containing 1 ml of 2% plain lidocaine, 1 ml 0.5% plain marcaine, 0.5 ml of kenalog 10 and 0.5 ml of dexamethasone phosphate  into 4-5 intermetatarsal space on left foot without complication. Post-injection care discussed with patient.  -Recommend rest, ice, elevation and topical OTC pain cream -Dispensed toe separators to use as instructed  -Patient to return to office as needed or sooner if condition worsens.  Landis Martins, DPM

## 2017-09-26 ENCOUNTER — Ambulatory Visit (INDEPENDENT_AMBULATORY_CARE_PROVIDER_SITE_OTHER): Payer: Medicare Other | Admitting: Podiatry

## 2017-09-26 ENCOUNTER — Encounter: Payer: Self-pay | Admitting: Podiatry

## 2017-09-26 DIAGNOSIS — M7752 Other enthesopathy of left foot: Secondary | ICD-10-CM

## 2017-09-26 DIAGNOSIS — M2042 Other hammer toe(s) (acquired), left foot: Secondary | ICD-10-CM

## 2017-09-26 DIAGNOSIS — I2510 Atherosclerotic heart disease of native coronary artery without angina pectoris: Secondary | ICD-10-CM | POA: Diagnosis not present

## 2017-09-26 DIAGNOSIS — M778 Other enthesopathies, not elsewhere classified: Secondary | ICD-10-CM

## 2017-09-26 DIAGNOSIS — M779 Enthesopathy, unspecified: Principal | ICD-10-CM

## 2017-09-26 DIAGNOSIS — I2583 Coronary atherosclerosis due to lipid rich plaque: Secondary | ICD-10-CM

## 2017-09-26 DIAGNOSIS — Q828 Other specified congenital malformations of skin: Secondary | ICD-10-CM

## 2017-09-26 NOTE — Progress Notes (Signed)
She presents today states that she still has pain to the fifth digit of the left foot.  She also has some tenderness to the distal aspect of the fourth digit left foot.  I have reviewed her past medical history medications and allergies.  Objective: Pulses are palpable.  Hammertoe deformities fourth and fifth.  Radiographs do demonstrate osteoarthritic changes to the fourth and fifth DIPJ.  She has pain on palpation to this area today.  Porokeratosis is noted to the distal aspect of the fourth toe.  Assessment: Porokeratosis fourth left.  Degenerative joint disease DIPJ with capsulitis.  Plan: I injected directly into the DIPJ fourth digit left foot with 2 mg of dexamethasone and 2-1/2 mg of Marcaine.  She tolerated this procedure well without complications this was performed after sterile Betadine skin prep and verbal permission.  Also debrided the reactive hyperkeratosis.  And I will follow-up with her as needed.

## 2017-10-24 ENCOUNTER — Encounter: Payer: Self-pay | Admitting: Podiatry

## 2017-10-24 ENCOUNTER — Ambulatory Visit (INDEPENDENT_AMBULATORY_CARE_PROVIDER_SITE_OTHER): Payer: Medicare Other | Admitting: Podiatry

## 2017-10-24 DIAGNOSIS — Q828 Other specified congenital malformations of skin: Secondary | ICD-10-CM | POA: Diagnosis not present

## 2017-10-24 NOTE — Progress Notes (Signed)
She presents today for follow-up of her fourth toe left foot.  And most importantly for painful lesions on plantar aspect of the bilateral foot.  She states that after the injection into the fourth toe of the left foot of had no more pain.  I do have these painful little areas on the bottom of my right foot that I would do to look at.  Denies any changes in her past medical history medications or allergies.  Objective: Vital signs are stable she is alert and oriented x3 no apparent distress.  Pulses are palpable neurologic sensorium is intact orthopedic evaluation demonstrates hammertoe deformities.  Cutaneous evaluation does demonstrate atrophy beneath the first metatarsal and lesser metatarsophalangeal joints.  Solitary poor keratomas x3 present on the plantar aspect of the forefoot right.  These were sharply debrided today.  Assessment: Pain in limb secondary to porokeratosis benign lesions right foot.  Well-healing osteoarthritic changes and capsulitis fourth toe left foot.  Plan: Sharp debridement of these lesions today and I will follow-up with her on an as-needed basis.

## 2018-02-12 ENCOUNTER — Ambulatory Visit (INDEPENDENT_AMBULATORY_CARE_PROVIDER_SITE_OTHER): Payer: Medicare Other | Admitting: Adult Health

## 2018-02-12 ENCOUNTER — Encounter: Payer: Self-pay | Admitting: Adult Health

## 2018-02-12 VITALS — BP 122/74 | Temp 98.5°F | Ht 63.0 in | Wt 141.0 lb

## 2018-02-12 DIAGNOSIS — I10 Essential (primary) hypertension: Secondary | ICD-10-CM

## 2018-02-12 DIAGNOSIS — E785 Hyperlipidemia, unspecified: Secondary | ICD-10-CM

## 2018-02-12 DIAGNOSIS — R7309 Other abnormal glucose: Secondary | ICD-10-CM

## 2018-02-12 LAB — CBC WITH DIFFERENTIAL/PLATELET
BASOS PCT: 0.6 % (ref 0.0–3.0)
Basophils Absolute: 0 10*3/uL (ref 0.0–0.1)
Eosinophils Absolute: 0.1 10*3/uL (ref 0.0–0.7)
Eosinophils Relative: 2.2 % (ref 0.0–5.0)
HEMATOCRIT: 42.2 % (ref 36.0–46.0)
Hemoglobin: 14.1 g/dL (ref 12.0–15.0)
LYMPHS ABS: 2 10*3/uL (ref 0.7–4.0)
LYMPHS PCT: 32.5 % (ref 12.0–46.0)
MCHC: 33.3 g/dL (ref 30.0–36.0)
MCV: 95.1 fl (ref 78.0–100.0)
Monocytes Absolute: 0.6 10*3/uL (ref 0.1–1.0)
Monocytes Relative: 10 % (ref 3.0–12.0)
NEUTROS ABS: 3.4 10*3/uL (ref 1.4–7.7)
Neutrophils Relative %: 54.7 % (ref 43.0–77.0)
PLATELETS: 177 10*3/uL (ref 150.0–400.0)
RBC: 4.44 Mil/uL (ref 3.87–5.11)
RDW: 13.1 % (ref 11.5–15.5)
WBC: 6.2 10*3/uL (ref 4.0–10.5)

## 2018-02-12 LAB — BASIC METABOLIC PANEL
BUN: 15 mg/dL (ref 6–23)
CHLORIDE: 106 meq/L (ref 96–112)
CO2: 29 meq/L (ref 19–32)
Calcium: 10 mg/dL (ref 8.4–10.5)
Creatinine, Ser: 0.83 mg/dL (ref 0.40–1.20)
GFR: 69.48 mL/min (ref >60.00)
GLUCOSE: 104 mg/dL — AB (ref 70–99)
Potassium: 4.5 mEq/L (ref 3.5–5.1)
SODIUM: 141 meq/L (ref 135–145)

## 2018-02-12 LAB — HEMOGLOBIN A1C: HEMOGLOBIN A1C: 6.4 % (ref 4.6–6.5)

## 2018-02-12 LAB — LIPID PANEL
Cholesterol: 133 mg/dL (ref 0–200)
HDL: 46 mg/dL (ref >39.00)
LDL Cholesterol: 56 mg/dL (ref 0–99)
NonHDL: 87.31
Total CHOL/HDL Ratio: 3
Triglycerides: 158 mg/dL — ABNORMAL HIGH (ref 0.0–149.0)
VLDL: 31.6 mg/dL (ref 0.0–40.0)

## 2018-02-12 NOTE — Progress Notes (Signed)
Subjective:    Patient ID: Rachel Nichols, female    DOB: October 28, 1930, 84 y.o.   MRN: [id number]  HPI  Patient presents for yearly follow up exam. She is a remarkable 84 year old female who  has a past medical history of Adenomatous colon polyp (2002), Basal cell carcinoma, Benign neoplasm of colon (2004), CAD (coronary artery disease), Carotid bruit, Diverticulosis (2002,2004,2007), Duodenitis, Fibrocystic breast disease, Gastritis, GERD (gastroesophageal reflux disease), Heart attack ([place]) (1993), Hiatal hernia, History of blood transfusion, Hyperlipidemia, Peptic ulcer due to Helicobacter pylori, and PUD (peptic ulcer disease).  Hyperlipidemia -she takes Zetia, Crestor 5mg , and aspirin 81 mg daily. Lab Results  Component Value Date   CHOL 130 07/26/2017   HDL 44 07/26/2017   LDLCALC 53 07/26/2017   TRIG 164 (H) 07/26/2017   CHOLHDL 3.0 07/26/2017    S/p MI -takes metoprolol one half tab daily.  She is followed by cardiology.  She denies any chest pain or shortness of breath with exertion  Elevated Glucose -when checked by cardiology in October 2018.  A1c increased to 6.3  All immunizations and health maintenance protocols were reviewed with the patient and needed orders were placed.  She is up-to-date on vaccinations  Appropriate screening laboratory values were ordered for the patient including screening of hyperlipidemia, renal function and hepatic function.  Medication reconciliation,  past medical history, social history, problem list and allergies were reviewed in detail with the patient  Goals were established with regard to weight loss, exercise, and  diet in compliance with medications.  She continues to be active and [facility] at least several days per week.  End of life planning was discussed.  She has an advanced directive and living well  She participates in routine dental and vision screens.  She has no longer in need of a colonoscopy, mammogram, or Pap  smear.  She has no acute complaints     Review of Systems  Constitutional: Negative.   HENT: Negative.   Eyes: Negative.   Respiratory: Negative.   Cardiovascular: Negative.   Gastrointestinal: Negative.   Endocrine: Negative.   Genitourinary: Negative.   Musculoskeletal: Negative.   Skin: Negative.   Allergic/Immunologic: Negative.   Neurological: Negative.   Hematological: Negative.   Psychiatric/Behavioral: Negative.    Past Medical History:  Diagnosis Date  . Adenomatous colon polyp 2002   Colonoscopy   . Basal cell carcinoma    Nose  . Benign neoplasm of colon 2004   Colonoscopy  . CAD (coronary artery disease)    echo 09/13/10- EF>55%, aortic valve is mildly sclerotic; myoview 02-20-12-no ischemia  . Carotid bruit    carotid dopplers 05/16/04- normal  . Diverticulosis 2002,2004,2007   Colonoscopy  . Duodenitis   . Fibrocystic breast disease   . Gastritis   . GERD (gastroesophageal reflux disease)   . Heart attack ([place]) 1993   Cardiolyte stress---  stents 2012  . Hiatal hernia   . History of blood transfusion    pt denies  . Hyperlipidemia   . Peptic ulcer due to Helicobacter pylori   . PUD (peptic ulcer disease)     Social History   Socioeconomic History  . Marital status: Married    Spouse name: Not on file  . Number of children: 2  . Years of education: Not on file  . Highest education level: Not on file  Occupational History  . Occupation: retired Research scientist (physical sciences): RETIRED  Social Needs  .  Financial resource strain: Not on file  . Food insecurity:    Worry: Not on file    Inability: Not on file  . Transportation needs:    Medical: Not on file    Non-medical: Not on file  Tobacco Use  . Smoking status: Former Smoker    Packs/day: 2.00    Years: 15.00    Pack years: 30.00    Types: Cigarettes    Last attempt to quit: 10/09/1975    Years since quitting: 42.3  . Smokeless tobacco: Never Used  Substance and Sexual Activity  .  Alcohol use: No    Comment: none  . Drug use: No  . Sexual activity: Not Currently    Partners: Male  Lifestyle  . Physical activity:    Days per week: Not on file    Minutes per session: Not on file  . Stress: Not on file  Relationships  . Social connections:    Talks on phone: Not on file    Gets together: Not on file    Attends religious service: Not on file    Active member of club or organization: Not on file    Attends meetings of clubs or organizations: Not on file    Relationship status: Not on file  . Intimate partner violence:    Fear of current or ex partner: Not on file    Emotionally abused: Not on file    Physically abused: Not on file    Forced sexual activity: Not on file  Other Topics Concern  . Not on file  Social History Narrative   Exercise--  Gym 3 days a week,   2 days swim    Past Surgical History:  Procedure Laterality Date  . ANGIOPLASTY  1993  . APPENDECTOMY  1957  . BASAL CELL CARCINOMA EXCISION     From the nose  . CATARACT EXTRACTION  10/26/09   Right eye  . CATARACT EXTRACTION  11/09/09   Left eye  . CHOLECYSTECTOMY  1984  . CORONARY STENT PLACEMENT  01/10/11   hig grade ramus branch stenosis with mod LAD dz, PCI/stenting of ramus branch with a Resolute DES   . DILATION AND CURETTAGE OF UTERUS    . FINGER SURGERY  04/2011   Cyst removal, right hand forefinger  . LEFT OOPHORECTOMY  1957    Family History  Problem Relation Age of Onset  . Heart disease Brother 45       MI  . Heart disease Sister        stents  . Neuropathy Sister   . Hypertension Sister   . Stroke Mother   . Heart disease Father   . Heart attack Father   . Stroke Maternal Grandfather   . Heart attack Paternal Grandmother   . Heart attack Paternal Grandfather   . Breast cancer Maternal Aunt        x 2 aunts  . Irritable bowel syndrome Brother   . Heart attack Brother   . Epilepsy Daughter   . Heart attack Paternal Aunt        x 9  . Colon cancer Neg Hx      Allergies  Allergen Reactions  . Diltiazem Hcl   . Tape Other (See Comments)    It pulls the skin off.  . Tramadol Hcl     Current Outpatient Medications on File Prior to Visit  Medication Sig Dispense Refill  . aspirin 81 MG tablet Take 81 mg by mouth  daily.    . cholecalciferol (VITAMIN D) 1000 UNITS tablet Take 2,000 Units by mouth daily.    . Coenzyme Q10 (CO Q10) 100 MG CAPS Take 200 mg by mouth daily.    . Cyanocobalamin (VITAMIN B 12 PO) Take 500 mg by mouth daily.    Marland Kitchen ezetimibe (ZETIA) 10 MG tablet Take 1 tablet (10 mg total) by mouth daily. 90 tablet 3  . metoprolol succinate (TOPROL-XL) 25 MG 24 hr tablet TAKE 1/2 TABLET EVERY DAY 45 tablet 3  . Multiple Vitamins-Minerals (CENTRUM SILVER ULTRA WOMENS) TABS Take 1 tablet by mouth daily.    . rosuvastatin (CRESTOR) 10 MG tablet TAKE 1 TABLET EVERY DAY 90 tablet 3   Current Facility-Administered Medications on File Prior to Visit  Medication Dose Route Frequency Provider Last Rate Last Dose  . triamcinolone acetonide (KENALOG) 10 MG/ML injection 10 mg  10 mg Other Once Landis Martins, DPM        There were no vitals taken for this visit.      Objective:   Physical Exam  Constitutional: She is oriented to person, place, and time. She appears well-developed and well-nourished. No distress.  HENT:  Head: Normocephalic and atraumatic.  Right Ear: Hearing, tympanic membrane, external ear and ear canal normal.  Left Ear: Tympanic membrane, external ear and ear canal normal.  Nose: Nose normal.  Mouth/Throat: Oropharynx is clear and moist. She has dentures. Abnormal dentition. No oropharyngeal exudate.  Eyes: Pupils are equal, round, and reactive to light. Conjunctivae and EOM are normal. Right eye exhibits no discharge. Left eye exhibits no discharge. No scleral icterus.  Neck: Trachea normal, normal range of motion, full passive range of motion without pain and phonation normal. Neck supple. Carotid bruit is not  present. No tracheal deviation present. No thyroid mass and no thyromegaly present.  Cardiovascular: Normal rate, regular rhythm and intact distal pulses. Exam reveals no gallop and no friction rub.  Murmur heard.  Systolic murmur is present with a grade of 2/6. Pulmonary/Chest: Effort normal and breath sounds normal. No respiratory distress. She has no wheezes. She has no rales. She exhibits no tenderness.  Abdominal: Soft. Bowel sounds are normal. She exhibits no distension and no mass. There is no tenderness. There is no rebound and no guarding. No hernia.  Musculoskeletal: Normal range of motion. She exhibits no edema, tenderness or deformity.  Lymphadenopathy:    She has no cervical adenopathy.  Neurological: She is alert and oriented to person, place, and time. No cranial nerve deficit. Coordination normal.  Skin: Skin is warm and dry. Capillary refill takes less than 2 seconds. No rash noted. She is not diaphoretic. No erythema. No pallor.  Psychiatric: She has a normal mood and affect. Her behavior is normal. Judgment and thought content normal.  Nursing note and vitals reviewed.     Assessment & Plan:  1. Essential hypertension - Well controlled. No change in medication  - Lipid panel - Hemoglobin V0J - Basic metabolic panel - CBC with Differential/Platelet  2. Hyperlipidemia, unspecified hyperlipidemia type - Encouraged heart healthy diet and to continue to swim  - Lipid panel - Hemoglobin J0K - Basic metabolic panel - CBC with Differential/Platelet  3. Elevated glucose - Consider low dose metformin but unlikely due to age  - Lipid panel - Hemoglobin X3G - Basic metabolic panel - CBC with Differential/Platelet   Dorothyann Peng, NP

## 2018-03-24 DIAGNOSIS — H5213 Myopia, bilateral: Secondary | ICD-10-CM | POA: Diagnosis not present

## 2018-03-24 DIAGNOSIS — H52203 Unspecified astigmatism, bilateral: Secondary | ICD-10-CM | POA: Diagnosis not present

## 2018-03-24 DIAGNOSIS — H538 Other visual disturbances: Secondary | ICD-10-CM | POA: Diagnosis not present

## 2018-03-24 DIAGNOSIS — H04123 Dry eye syndrome of bilateral lacrimal glands: Secondary | ICD-10-CM | POA: Diagnosis not present

## 2018-03-24 DIAGNOSIS — Z961 Presence of intraocular lens: Secondary | ICD-10-CM | POA: Diagnosis not present

## 2018-03-24 DIAGNOSIS — H524 Presbyopia: Secondary | ICD-10-CM | POA: Diagnosis not present

## 2018-05-26 DIAGNOSIS — L570 Actinic keratosis: Secondary | ICD-10-CM | POA: Diagnosis not present

## 2018-05-26 DIAGNOSIS — Z85828 Personal history of other malignant neoplasm of skin: Secondary | ICD-10-CM | POA: Diagnosis not present

## 2018-05-26 DIAGNOSIS — D485 Neoplasm of uncertain behavior of skin: Secondary | ICD-10-CM | POA: Diagnosis not present

## 2018-05-26 DIAGNOSIS — L821 Other seborrheic keratosis: Secondary | ICD-10-CM | POA: Diagnosis not present

## 2018-06-30 ENCOUNTER — Ambulatory Visit: Payer: Self-pay

## 2018-06-30 ENCOUNTER — Other Ambulatory Visit: Payer: Self-pay

## 2018-06-30 ENCOUNTER — Ambulatory Visit (INDEPENDENT_AMBULATORY_CARE_PROVIDER_SITE_OTHER): Payer: Medicare Other | Admitting: Family Medicine

## 2018-06-30 ENCOUNTER — Encounter: Payer: Self-pay | Admitting: Family Medicine

## 2018-06-30 VITALS — BP 116/68 | HR 71 | Temp 98.3°F | Ht 63.0 in | Wt 146.1 lb

## 2018-06-30 DIAGNOSIS — R1030 Lower abdominal pain, unspecified: Secondary | ICD-10-CM

## 2018-06-30 LAB — POCT URINALYSIS DIPSTICK
Bilirubin, UA: NEGATIVE
GLUCOSE UA: NEGATIVE
KETONES UA: NEGATIVE
Leukocytes, UA: NEGATIVE
Nitrite, UA: NEGATIVE
PH UA: 6 (ref 5.0–8.0)
Protein, UA: NEGATIVE
RBC UA: NEGATIVE
Spec Grav, UA: 1.025 (ref 1.010–1.025)
Urobilinogen, UA: 1 E.U./dL

## 2018-06-30 MED ORDER — CIPROFLOXACIN HCL 500 MG PO TABS: 500.0000 mg | ORAL_TABLET | Freq: Two times a day (BID) | ORAL | 0 refills | Status: DC

## 2018-06-30 MED ORDER — METRONIDAZOLE 500 MG PO TABS: 500.0000 mg | ORAL_TABLET | Freq: Three times a day (TID) | ORAL | 0 refills | Status: DC

## 2018-06-30 NOTE — Telephone Encounter (Signed)
noted 

## 2018-06-30 NOTE — Progress Notes (Signed)
Subjective:     Patient ID: Rachel Nichols, female   DOB: 1931-05-16, 84 y.o.   MRN: [id number]  HPI Patient seen as a work in with about 7-10 day history of left lower quadrant abdominal pain. She denies any prior history of similar pain. She describes achy pain which is mild to moderate severity. Good appetite. Denies any constipation or other stool changes. No dysuria. No exacerbating factors. Relieved somewhat with direct application of pressure over the left lower quadrant region. No radiation of pain.  Patient had colonoscopy several years ago with diverticulosis changes noted in the descending and sigmoid colon. No prior history of known diverticulitis. No history of kidney stones.  Past Medical History:  Diagnosis Date  . Adenomatous colon polyp 2002   Colonoscopy   . Basal cell carcinoma    Nose  . Benign neoplasm of colon 2004   Colonoscopy  . CAD (coronary artery disease)    echo 09/13/10- EF>55%, aortic valve is mildly sclerotic; myoview 02-20-12-no ischemia  . Carotid bruit    carotid dopplers 05/16/04- normal  . Diverticulosis 2002,2004,2007   Colonoscopy  . Duodenitis   . Fibrocystic breast disease   . Gastritis   . GERD (gastroesophageal reflux disease)   . Heart attack ([place]) 1993   Cardiolyte stress---  stents 2012  . Hiatal hernia   . History of blood transfusion    pt denies  . Hyperlipidemia   . Peptic ulcer due to Helicobacter pylori   . PUD (peptic ulcer disease)    Past Surgical History:  Procedure Laterality Date  . ANGIOPLASTY  1993  . APPENDECTOMY  1957  . BASAL CELL CARCINOMA EXCISION     From the nose  . CATARACT EXTRACTION  10/26/09   Right eye  . CATARACT EXTRACTION  11/09/09   Left eye  . CHOLECYSTECTOMY  1984  . CORONARY STENT PLACEMENT  01/10/11   hig grade ramus branch stenosis with mod LAD dz, PCI/stenting of ramus branch with a Resolute DES   . DILATION AND CURETTAGE OF UTERUS    . FINGER SURGERY  04/2011   Cyst removal, right hand forefinger   . LEFT OOPHORECTOMY  1957    reports that she quit smoking about 42 years ago. Her smoking use included cigarettes. She has a 30.00 pack-year smoking history. She has never used smokeless tobacco. She reports that she does not drink alcohol or use drugs. family history includes Breast cancer in her maternal aunt; Epilepsy in her daughter; Heart attack in her brother, father, paternal aunt, paternal grandfather, and paternal grandmother; Heart disease in her father and sister; Heart disease (age of onset: 31) in her brother; Hypertension in her sister; Irritable bowel syndrome in her brother; Neuropathy in her sister; Stroke in her maternal grandfather and mother. Allergies  Allergen Reactions  . Diltiazem Hcl   . Tape Other (See Comments)    It pulls the skin off.  . Tramadol Hcl      Review of Systems  Constitutional: Negative for appetite change, chills, fever and unexpected weight change.  Respiratory: Negative for shortness of breath.   Cardiovascular: Negative for chest pain.  Gastrointestinal: Positive for abdominal pain. Negative for blood in stool, constipation, diarrhea, nausea and vomiting.  Genitourinary: Negative for dysuria.  Neurological: Negative for dizziness.       Objective:   Physical Exam  Constitutional: She appears well-developed and well-nourished.  Cardiovascular: Normal rate.  Pulmonary/Chest: Effort normal and breath sounds normal.  Abdominal: Normal appearance and  bowel sounds are normal. She exhibits no distension and no mass. There is no hepatomegaly. There is no rigidity, no rebound and no guarding. No hernia.  Mild tenderness to deep palpation left lower quadrant. No guarding or rebound. No mass.       Assessment:     Patient presents with abdominal pain left lower quadrant 7-10 day duration. Suspect acute diverticulitis. Non-acute abdomen Urine dipstick is normal    Plan:     -check CBC with differential -Start Cipro 500 mg twice daily for 10  days and Flagyl 500 milligrams 3 times a day for 10 days -Follow-up promptly for any increasing fever, increased abdominal pain, or other concerns. Also recommend touch base in 2-3 days if not improving. CT abdomen and pelvis for any worsening/persistent symptoms  Eulas Post MD [facility]

## 2018-06-30 NOTE — Patient Instructions (Signed)
Diverticulitis °Diverticulitis is infection or inflammation of small pouches (diverticula) in the colon that form due to a condition called diverticulosis. Diverticula can trap stool (feces) and bacteria, causing infection and inflammation. °Diverticulitis may cause severe stomach pain and diarrhea. It may lead to tissue damage in the colon that causes bleeding. The diverticula may also burst (rupture) and cause infected stool to enter other areas of the abdomen. °Complications of diverticulitis can include: °· Bleeding. °· Severe infection. °· Severe pain. °· Rupture (perforation) of the colon. °· Blockage (obstruction) of the colon. ° °What are the causes? °This condition is caused by stool becoming trapped in the diverticula, which allows bacteria to grow in the diverticula. This leads to inflammation and infection. °What increases the risk? °You are more likely to develop this condition if: °· You have diverticulosis. The risk for diverticulosis increases if: °? You are overweight or obese. °? You use tobacco products. °? You do not get enough exercise. °· You eat a diet that does not include enough fiber. High-fiber foods include fruits, vegetables, beans, nuts, and whole grains. ° °What are the signs or symptoms? °Symptoms of this condition may include: °· Pain and tenderness in the abdomen. The pain is normally located on the left side of the abdomen, but it may occur in other areas. °· Fever and chills. °· Bloating. °· Cramping. °· Nausea. °· Vomiting. °· Changes in bowel routines. °· Blood in your stool. ° °How is this diagnosed? °This condition is diagnosed based on: °· Your medical history. °· A physical exam. °· Tests to make sure there is nothing else causing your condition. These tests may include: °? Blood tests. °? Urine tests. °? Imaging tests of the abdomen, including X-rays, ultrasounds, MRIs, or CT scans. ° °How is this treated? °Most cases of this condition are mild and can be treated at home.  Treatment may include: °· Taking over-the-counter pain medicines. °· Following a clear liquid diet. °· Taking antibiotic medicines by mouth. °· Rest. ° °More severe cases may need to be treated at a hospital. Treatment may include: °· Not eating or drinking. °· Taking prescription pain medicine. °· Receiving antibiotic medicines through an IV tube. °· Receiving fluids and nutrition through an IV tube. °· Surgery. ° °When your condition is under control, your health care provider may recommend that you have a colonoscopy. This is an exam to look at the entire large intestine. During the exam, a lubricated, bendable tube is inserted into the anus and then passed into the rectum, colon, and other parts of the large intestine. A colonoscopy can show how severe your diverticula are and whether something else may be causing your symptoms. °Follow these instructions at home: °Medicines °· Take over-the-counter and prescription medicines only as told by your health care provider. These include fiber supplements, probiotics, and stool softeners. °· If you were prescribed an antibiotic medicine, take it as told by your health care provider. Do not stop taking the antibiotic even if you start to feel better. °· Do not drive or use heavy machinery while taking prescription pain medicine. °General instructions °· Follow a full liquid diet or another diet as directed by your health care provider. After your symptoms improve, your health care provider may tell you to change your diet. He or she may recommend that you eat a diet that contains at least 25 g (25 grams) of fiber daily. Fiber makes it easier to pass stool. Healthy sources of fiber include: °? Berries. One cup   contains 4-8 grams of fiber. °? Beans or lentils. One half cup contains 5-8 grams of fiber. °? Green vegetables. One cup contains 4 grams of fiber. °· Exercise for at least 30 minutes, 3 times each week. You should exercise hard enough to raise your heart rate and  break a sweat. °· Keep all follow-up visits as told by your health care provider. This is important. You may need a colonoscopy. °Contact a health care provider if: °· Your pain does not improve. °· You have a hard time drinking or eating food. °· Your bowel movements do not return to normal. °Get help right away if: °· Your pain gets worse. °· Your symptoms do not get better with treatment. °· Your symptoms suddenly get worse. °· You have a fever. °· You vomit more than one time. °· You have stools that are bloody, black, or tarry. °Summary °· Diverticulitis is infection or inflammation of small pouches (diverticula) in the colon that form due to a condition called diverticulosis. Diverticula can trap stool (feces) and bacteria, causing infection and inflammation. °· You are at higher risk for this condition if you have diverticulosis and you eat a diet that does not include enough fiber. °· Most cases of this condition are mild and can be treated at home. More severe cases may need to be treated at a hospital. °· When your condition is under control, your health care provider may recommend that you have an exam called a colonoscopy. This exam can show how severe your diverticula are and whether something else may be causing your symptoms. °This information is not intended to replace advice given to you by your health care provider. Make sure you discuss any questions you have with your health care provider. °Document Released: 07/04/2005 Document Revised: 10/27/2016 Document Reviewed: 10/27/2016 °Elsevier Interactive Patient Education © 2018 [employer] ° °

## 2018-06-30 NOTE — Telephone Encounter (Signed)
Pt. Reports she started having abdominal pain 10 days ago. Hurts in the lower left quadrant of abdomen. Comes and goes. The pain at it's worse has been a "8". No pain this morning. Denies any constipation or diarrhea. No fever.Agent made pt. Appointment for this afternoon.  Answer Assessment - Initial Assessment Questions 1. LOCATION: "Where does it hurt?"      Left lower quadrant 2. RADIATION: "Does the pain shoot anywhere else?" (e.g., chest, back)     No 3. ONSET: "When did the pain begin?" (e.g., minutes, hours or days ago)       10 days ago 4. SUDDEN: "Gradual or sudden onset?"      Gradual 5. PATTERN "Does the pain come and go, or is it constant?"    - If constant: "Is it getting better, staying the same, or worsening?"      (Note: Constant means the pain never goes away completely; most serious pain is constant and it progresses)     - If intermittent: "How long does it last?" "Do you have pain now?"     (Note: Intermittent means the pain goes away completely between bouts)     Comes and goes 6. SEVERITY: "How bad is the pain?"  (e.g., Scale 1-10; mild, moderate, or severe)   - MILD (1-3): doesn't interfere with normal activities, abdomen soft and not tender to touch    - MODERATE (4-7): interferes with normal activities or awakens from sleep, tender to touch    - SEVERE (8-10): excruciating pain, doubled over, unable to do any normal activities       At it's worse it is a 8 7. RECURRENT SYMPTOM: "Have you ever had this type of abdominal pain before?" If so, ask: "When was the last time?" and "What happened that time?"      No 8. CAUSE: "What do you think is causing the abdominal pain?"     Unsure 9. RELIEVING/AGGRAVATING FACTORS: "What makes it better or worse?" (e.g., movement, antacids, bowel movement)     Crossing her legs it hepls 10. OTHER SYMPTOMS: "Has there been any vomiting, diarrhea, constipation, or urine problems?"       No 11. PREGNANCY: "Is there any chance you are  pregnant?" "When was your last menstrual period?"       No  Protocols used: ABDOMINAL PAIN - [facility]

## 2018-07-01 ENCOUNTER — Other Ambulatory Visit: Payer: Self-pay | Admitting: Cardiovascular Disease

## 2018-07-01 DIAGNOSIS — E785 Hyperlipidemia, unspecified: Secondary | ICD-10-CM

## 2018-07-01 LAB — CBC WITH DIFFERENTIAL/PLATELET
Basophils Absolute: 0.1 10*3/uL (ref 0.0–0.1)
Basophils Relative: 0.9 % (ref 0.0–3.0)
EOS ABS: 0.1 10*3/uL (ref 0.0–0.7)
EOS PCT: 1.3 % (ref 0.0–5.0)
HCT: 40.6 % (ref 36.0–46.0)
HEMOGLOBIN: 13.6 g/dL (ref 12.0–15.0)
LYMPHS ABS: 2.2 10*3/uL (ref 0.7–4.0)
Lymphocytes Relative: 37.6 % (ref 12.0–46.0)
MCHC: 33.6 g/dL (ref 30.0–36.0)
MCV: 95.7 fl (ref 78.0–100.0)
MONO ABS: 0.6 10*3/uL (ref 0.1–1.0)
Monocytes Relative: 10.9 % (ref 3.0–12.0)
NEUTROS PCT: 49.3 % (ref 43.0–77.0)
Neutro Abs: 2.9 10*3/uL (ref 1.4–7.7)
Platelets: 170 10*3/uL (ref 150.0–400.0)
RBC: 4.24 Mil/uL (ref 3.87–5.11)
RDW: 13.4 % (ref 11.5–15.5)
WBC: 5.9 10*3/uL (ref 4.0–10.5)

## 2018-07-24 DIAGNOSIS — L821 Other seborrheic keratosis: Secondary | ICD-10-CM | POA: Diagnosis not present

## 2018-07-24 DIAGNOSIS — H61001 Unspecified perichondritis of right external ear: Secondary | ICD-10-CM | POA: Diagnosis not present

## 2018-07-24 DIAGNOSIS — Z85828 Personal history of other malignant neoplasm of skin: Secondary | ICD-10-CM | POA: Diagnosis not present

## 2018-07-24 DIAGNOSIS — C44622 Squamous cell carcinoma of skin of right upper limb, including shoulder: Secondary | ICD-10-CM | POA: Diagnosis not present

## 2018-07-24 DIAGNOSIS — D485 Neoplasm of uncertain behavior of skin: Secondary | ICD-10-CM | POA: Diagnosis not present

## 2018-07-24 DIAGNOSIS — H61031 Chondritis of right external ear: Secondary | ICD-10-CM | POA: Diagnosis not present

## 2018-07-24 HISTORY — PX: SQUAMOUS CELL CARCINOMA EXCISION: SHX2433

## 2018-07-30 ENCOUNTER — Telehealth: Payer: Self-pay | Admitting: *Deleted

## 2018-07-30 NOTE — Telephone Encounter (Signed)
Opened addressed to Dr. Carollee Herter, error; PCP is Claudine Mouton, NP, forwarded to provider via fax/SLS 10/23

## 2018-08-08 ENCOUNTER — Encounter: Payer: Self-pay | Admitting: Family Medicine

## 2018-09-09 ENCOUNTER — Ambulatory Visit (INDEPENDENT_AMBULATORY_CARE_PROVIDER_SITE_OTHER): Payer: Medicare Other

## 2018-09-09 ENCOUNTER — Encounter: Payer: Self-pay | Admitting: Cardiovascular Disease

## 2018-09-09 ENCOUNTER — Ambulatory Visit (INDEPENDENT_AMBULATORY_CARE_PROVIDER_SITE_OTHER): Payer: Medicare Other | Admitting: Cardiovascular Disease

## 2018-09-09 VITALS — BP 110/61 | HR 68 | Ht 64.0 in | Wt 145.6 lb

## 2018-09-09 DIAGNOSIS — Z23 Encounter for immunization: Secondary | ICD-10-CM | POA: Diagnosis not present

## 2018-09-09 DIAGNOSIS — I2583 Coronary atherosclerosis due to lipid rich plaque: Secondary | ICD-10-CM

## 2018-09-09 DIAGNOSIS — I2510 Atherosclerotic heart disease of native coronary artery without angina pectoris: Secondary | ICD-10-CM

## 2018-09-09 DIAGNOSIS — E785 Hyperlipidemia, unspecified: Secondary | ICD-10-CM

## 2018-09-09 DIAGNOSIS — I252 Old myocardial infarction: Secondary | ICD-10-CM

## 2018-09-09 DIAGNOSIS — R011 Cardiac murmur, unspecified: Secondary | ICD-10-CM | POA: Diagnosis not present

## 2018-09-09 DIAGNOSIS — I10 Essential (primary) hypertension: Secondary | ICD-10-CM | POA: Diagnosis not present

## 2018-09-09 NOTE — Progress Notes (Signed)
Patient ID: Rachel Nichols, female   DOB: 31-Dec-1930, 84 y.o.   MRN: [id number]       HPI: Rachel Nichols, is a 84 y.o. female who presents to the office for a 14 month cardiology evaluation.  [name] is  suffered a myocardial infarction in January 1993 and underwent PTCA of her RCA. In April 2012, a 2.5x12 mm Resolute DES stent was inserted to the proximal portion of a ramus intermediate vessel. She  had 60% LAD stenosis after diagonal vessel as well as 40-50% mid right coronary artery stenosis for which she's been treated medically. A myocardial perfusion study in May 2013  remained normal. She has continued to remain stable on her current medical regimen. We had increased her Crestor to 10 mg to take in addition to this area there LDL particle concentration was still elevated at 1387. Subsequent blood work on 11/10/2012 showed marked improvement with her LDL now at 61 and LDL particle number at 1027 the total cholesterol was 126, HDL 43, triglycerides 108. Her insulin resistance score was still slightly elevated at 56.  WhenI saw her in April 2016 for 18 month follow-up evaluation, her cardiac murmur was more pronounced.  I scheduled her for an echo Doppler study.  This was done on 06/27/2015.  LV function was normal with an ejection fraction of 65-70%.  There was grade 1 diastolic dysfunction.  There were no wall motion abnormalities.  She had mild left atrial dilatation.  She was not found to have significant valvular pathology.   I last saw her in October 2018 at which time she was continuing to do well. Specifically, she denied an any chest pain or palpitations.  She remained active and was swimming several days per week.  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  She was on metoprolol 12.5 mg daily and had reduced to rosuvastatin to only 5 mg but was continued to take Zetia.  She had noticed myalgias on higher dose and was also taking coenzyme  Q 10.  When I last saw her, I recommended follow-up laboratory.  Lipid studies in May 2019  showed a total cholesterol of 133, triglycerides 158, HDL 46, and LDL 56.  Her triglycerides had improved from a previous high of 194.  Hemoglobin A1c was 6.4.    Over the past year, she has remained stable and feels well.  She specifically denies any chest pain or shortness of breath.  She is sleeping well.  She denies any presyncope or syncope.  She presents for reevaluation.   Past Medical History:  Diagnosis Date  . Adenomatous colon polyp 2002   Colonoscopy   . Basal cell carcinoma    Nose  . Benign neoplasm of colon 2004   Colonoscopy  . CAD (coronary artery disease)    echo 09/13/10- EF>55%, aortic valve is mildly sclerotic; myoview 02-20-12-no ischemia  . Carotid bruit    carotid dopplers 05/16/04- normal  . Diverticulosis 2002,2004,2007   Colonoscopy  . Duodenitis   . Fibrocystic breast disease   . Gastritis   . GERD (gastroesophageal reflux disease)   . Heart attack ([place]) 1993   Cardiolyte stress---  stents 2012  . Hiatal hernia   . History of blood transfusion    pt denies  . Hyperlipidemia   . Peptic ulcer due to Helicobacter pylori   . PUD (peptic ulcer disease)     Past Surgical History:  Procedure Laterality Date  .  ANGIOPLASTY  1993  . APPENDECTOMY  1957  . BASAL CELL CARCINOMA EXCISION     From the nose  . CATARACT EXTRACTION  10/26/09   Right eye  . CATARACT EXTRACTION  11/09/09   Left eye  . CHOLECYSTECTOMY  1984  . CORONARY STENT PLACEMENT  01/10/11   hig grade ramus branch stenosis with mod LAD dz, PCI/stenting of ramus branch with a Resolute DES   . DILATION AND CURETTAGE OF UTERUS    . FINGER SURGERY  04/2011   Cyst removal, right hand forefinger  . LEFT OOPHORECTOMY  1957  . SQUAMOUS CELL CARCINOMA EXCISION Right 07/24/2018   Posterior forearm shave & ED&C    Allergies  Allergen Reactions  . Diltiazem Hcl   . Tape Other (See Comments)    It pulls the skin  off.  . Tramadol Hcl     Current Outpatient Medications  Medication Sig Dispense Refill  . aspirin 81 MG tablet Take 81 mg by mouth daily.    . cholecalciferol (VITAMIN D) 1000 UNITS tablet Take 2,000 Units by mouth daily.    . Coenzyme Q10 (CO Q10) 100 MG CAPS Take 200 mg by mouth daily.    . Cyanocobalamin (VITAMIN B 12 PO) Take 500 mg by mouth daily.    Marland Kitchen ezetimibe (ZETIA) 10 MG tablet TAKE 1 TABLET (10 MG TOTAL) BY MOUTH DAILY. 90 tablet 3  . metoprolol succinate (TOPROL-XL) 25 MG 24 hr tablet TAKE 1/2 TABLET EVERY DAY 45 tablet 3  . Multiple Vitamins-Minerals (CENTRUM SILVER ULTRA WOMENS) TABS Take 1 tablet by mouth daily.    . rosuvastatin (CRESTOR) 10 MG tablet TAKE 1 TABLET EVERY DAY 90 tablet 3   Current Facility-Administered Medications  Medication Dose Route Frequency Provider Last Rate Last Dose  . triamcinolone acetonide (KENALOG) 10 MG/ML injection 10 mg  10 mg Other Once Landis Martins, DPM       Socially she is married. Has one child who is living and one child who is deceased. She has 3 grandchildren. There is no tobacco or alcohol use.  ROS General: Negative; No fevers, chills, or night sweats HEENT: Negative; No changes in vision or hearing, sinus congestion, difficulty swallowing Pulmonary: Negative; No cough, wheezing, shortness of breath, hemoptysis Cardiovascular: See HPI GI: Negative; No nausea, vomiting, diarrhea, or abdominal pain GU: Negative; No dysuria, hematuria, or difficulty voiding Musculoskeletal: Intermittent cramps in her legs and feet. Hematologic/Oncology: Negative; no easy bruising, bleeding Endocrine: Negative; no heat/cold intolerance; no diabetes Neuro: Negative; no changes in balance, headaches Skin: Negative; No rashes or skin lesions Psychiatric: Negative; No behavioral problems, depression Sleep: Negative; No snoring, daytime sleepiness, hypersomnolence, bruxism, restless legs, hypnogognic hallucinations, no cataplexy Other  comprehensive 14 point system review is negative.   PE BP 110/61   Pulse 68   Ht 5\' 4"  (1.626 m)   Wt 145 lb 9.6 oz (66 kg)   BMI 24.99 kg/m    Repeat blood pressure by me was 108/60  Wt Readings from Last 3 Encounters:  09/09/18 145 lb 9.6 oz (66 kg)  06/30/18 146 lb 1.6 oz (66.3 kg)  02/12/18 141 lb (64 kg)   General: Alert, oriented, no distress.  Skin: normal turgor, no rashes, warm and dry HEENT: Normocephalic, atraumatic. Pupils equal round and reactive to light; sclera anicteric; extraocular muscles intact;  Nose without nasal septal hypertrophy Mouth/Parynx benign; Mallinpatti scale 3 Neck: No JVD, no carotid bruits; normal carotid upstroke Lungs: clear to ausculatation and percussion; no wheezing or  rales Chest wall: without tenderness to palpitation Heart: PMI not displaced, RRR, s1 s2 normal, 2/6 systolic murmur in the aortic area, no diastolic murmur, no rubs, gallops, thrills, or heaves Abdomen: soft, nontender; no hepatosplenomehaly, BS+; abdominal aorta nontender and not dilated by palpation. Back: no CVA tenderness Pulses 2+ Musculoskeletal: full range of motion, normal strength, no joint deformities Extremities: no clubbing cyanosis or edema, Homan's sign negative  Neurologic: grossly nonfocal; Cranial nerves grossly wnl Psychologic: Normal mood and affect   ECG (independently read by me): Normal sinus rhythm at 68 bpm.  No ectopy.  Normal intervals.  October 2018 ECG (independently read by me): Normal sinus rhythm at 74 bpm.  Left axis deviation.  QTc interval 472 ms.  October 2017 ECG (independently read by me): Sinus bradycardia 56 bpm.  Normal intervals.  No ectopy.  October 2016 ECG (independently read by me): Sinus bradycardia at 55 bpm.  No ectopy.  Normal intervals.  April 2016 ECG (independently read by me): Sinus bradycardia 59 bpm.  No ectopy.  Pior ECG: Sinus rhythm at 53 beats per minute. PR interval 166 ms QTc interval 426  ms.  LABS  BMP Latest Ref Rng & Units 02/12/2018 07/26/2017 08/02/2016  Glucose 70 - 99 mg/dL 104(H) 106(H) 106(H)  BUN 6 - 23 mg/dL 15 12 14   Creatinine 0.40 - 1.20 mg/dL 0.83 0.83 0.93(H)  BUN/Creat Ratio 12 - 28 - 14 -  Sodium 135 - 145 mEq/L 141 143 141  Potassium 3.5 - 5.1 mEq/L 4.5 4.3 4.3  Chloride 96 - 112 mEq/L 106 103 107  CO2 19 - 32 mEq/L 29 24 26   Calcium 8.4 - 10.5 mg/dL 10.0 9.9 10.1      Component Value Date/Time   PROT 7.1 07/26/2017 0902   ALBUMIN 4.4 07/26/2017 0902   AST 29 07/26/2017 0902   ALT 34 (H) 07/26/2017 0902   ALKPHOS 53 07/26/2017 0902   BILITOT 0.4 07/26/2017 0902   BILIDIR 0.1 01/11/2014 1507    CBC Latest Ref Rng & Units 06/30/2018 02/12/2018 07/26/2017  WBC 4.0 - 10.5 K/uL 5.9 6.2 5.0  Hemoglobin 12.0 - 15.0 g/dL 13.6 14.1 14.3  Hematocrit 36.0 - 46.0 % 40.6 42.2 43.0  Platelets 150.0 - 400.0 K/uL 170.0 177.0 182   Lab Results  Component Value Date   MCV 95.7 06/30/2018   MCV 95.1 02/12/2018   MCV 94 07/26/2017   Lab Results  Component Value Date   TSH 2.880 07/26/2017   Lab Results  Component Value Date   HGBA1C 6.4 02/12/2018   Lipid Panel     Component Value Date/Time   CHOL 133 02/12/2018 1054   CHOL 130 07/26/2017 0902   TRIG 158.0 (H) 02/12/2018 1054   HDL 46.00 02/12/2018 1054   HDL 44 07/26/2017 0902   CHOLHDL 3 02/12/2018 1054   VLDL 31.6 02/12/2018 1054   LDLCALC 56 02/12/2018 1054   LDLCALC 53 07/26/2017 0902     BNP    Component Value Date/Time   PROBNP 86.0 01/10/2011 1357    Lipid Panel     Component Value Date/Time   CHOL 133 02/12/2018 1054   CHOL 130 07/26/2017 0902   TRIG 158.0 (H) 02/12/2018 1054   HDL 46.00 02/12/2018 1054   HDL 44 07/26/2017 0902   CHOLHDL 3 02/12/2018 1054   VLDL 31.6 02/12/2018 1054   LDLCALC 56 02/12/2018 1054   LDLCALC 53 07/26/2017 0902     RADIOLOGY: No results found.  IMPRESSION: 1. Coronary artery  disease due to lipid rich plaque   2. Systolic murmur   3.  Essential hypertension   4. Hyperlipidemia LDL goal <70   5. Old MI (myocardial infarction): PCI to RCA 1993     ASSESSMENT AND PLAN: Ms. Kjos is a young appearing 84 year old Caucasian female who is 26 years status post her initial myocardial infarction which time she underwent PTCA of RCA in 1993.  In 2012 she underwent successful DES stenting of the ramus intermediate vessel and had concomitant 60% LAD stenosis after diagonal vessel and 40-50% mid RCA stenoses.  She has continued to be angina free on metoprolol succinate at 12.5 mg daily.  Her ECG remained stable with a resting pulse in the 70s.  Her blood pressure today is stable but on the low side on her medical regimen consisting of Toprol-XL 12.5 mg.  She is now on Zetia 10 mg in addition to rosuvastatin and has only been taking 5 mg daily.  LDL cholesterol in May 2019 was 56.  Triglycerides were minimally elevated at 158.  She is sleeping well.  She is unaware of any palpitations.  She denies presyncope or syncope.  In 6 months, I recommended she undergo a follow-up echo Doppler study particularly in light of her cardiac murmur in the aortic area.  Her last echo evaluation was in September 2016.  I will see her in follow-up of her echo Doppler study and further recommendations will be made at that time.    Troy Sine, MD, [facility]  09/11/2018 8:37 PM

## 2018-09-09 NOTE — Patient Instructions (Signed)
Medication Instructions:  Your physician recommends that you continue on your current medications as directed. Please refer to the Current Medication list given to you today.  If you need a refill on your cardiac medications before your next appointment, please call your pharmacy.   Testing/Procedures: Your physician has requested that you have an echocardiogram in 6 MONTHS. Echocardiography is a painless test that uses sound waves to create images of your heart. It provides your doctor with information about the size and shape of your heart and how well your heart's chambers and valves are working. This procedure takes approximately one hour. There are no restrictions for this procedure.  This will be done at our [facility] location:  [place]: At [employer], you and your health needs are our priority.  As part of our continuing mission to provide you with exceptional heart care, we have created designated Provider Care Teams.  These Care Teams include your primary Cardiologist (physician) and Advanced Practice Providers (APPs -  Physician Assistants and Nurse Practitioners) who all work together to provide you with the care you need, when you need it. You will need a follow up appointment in 6 months (after echo).  Please call our office 2 months in advance to schedule this appointment.  You may see Dr. Claiborne Billings or one of the following Advanced Practice Providers on your designated Care Team: [place], [place] . Fabian Sharp, PA-C

## 2018-09-11 ENCOUNTER — Encounter: Payer: Self-pay | Admitting: Cardiovascular Disease

## 2018-11-03 ENCOUNTER — Ambulatory Visit (INDEPENDENT_AMBULATORY_CARE_PROVIDER_SITE_OTHER): Payer: Medicare Other | Admitting: Orthopaedic Surgery

## 2018-11-03 ENCOUNTER — Encounter (INDEPENDENT_AMBULATORY_CARE_PROVIDER_SITE_OTHER): Payer: Self-pay | Admitting: Orthopaedic Surgery

## 2018-11-03 ENCOUNTER — Ambulatory Visit (INDEPENDENT_AMBULATORY_CARE_PROVIDER_SITE_OTHER): Payer: Medicare Other

## 2018-11-03 DIAGNOSIS — M79605 Pain in left leg: Secondary | ICD-10-CM | POA: Diagnosis not present

## 2018-11-03 DIAGNOSIS — M7062 Trochanteric bursitis, left hip: Secondary | ICD-10-CM | POA: Diagnosis not present

## 2018-11-03 NOTE — Progress Notes (Signed)
The patient was seen today for left hip trochanteric bursitis.  I showed her stretching exercises and placed a steroid injection of the trochanteric area of her left hip.  She is very active 85 years old.  Her note had some issues in epic with saving it.  I documented everything thoroughly several times and still has problems with no being safe.  Her x-rays showed no disease in her right or left hip.  She tolerated the stretching exercises showed her as well as a steroid injection.  All question concerns were answered and addressed.  Follow will be as needed.

## 2018-11-03 NOTE — Progress Notes (Deleted)
Office Visit Note   Patient: Rachel Nichols           Date of Birth: 17-Dec-1930           MRN: [id number] Visit Date: 11/03/2018              Requested by: Dorothyann Peng, NP [address] [place], [place] [address] PCP: Dorothyann Peng, NP   Assessment & Plan: Visit Diagnoses:  1. Pain in left leg   2. Trochanteric bursitis, left hip     Plan: ***  Follow-Up Instructions: Return if symptoms worsen or fail to improve.   Orders:  Orders Placed This Encounter  Procedures  . Large Joint Inj: L greater trochanter  . XR HIP UNILAT W OR W/O PELVIS 1V LEFT   No orders of the defined types were placed in this encounter.     Procedures: No procedures performed   Clinical Data: No additional findings.   Subjective: Chief Complaint  Patient presents with  . Left Leg - Pain    HPI  Review of Systems   Objective: Vital Signs: There were no vitals taken for this visit.  Physical Exam  Ortho Exam  Specialty Comments:  No specialty comments available.  Imaging: No results found.   PMFS History: Patient Active Problem List   Diagnosis Date Noted  . Hyperlipidemia LDL goal <70 01/13/2015  . Murmur 01/13/2015  . RUQ abdominal pain 01/22/2014  . Pancreatic cyst 01/22/2014  . Essential hypertension 03/06/2013  . CAD (coronary artery disease) 04/14/2012  . GASTRITIS 10/19/2009  . ABDOMINAL PAIN-EPIGASTRIC 10/18/2009  . CHOLECYSTECTOMY, HX OF 10/18/2009  . DIVERTICULOSIS, COLON 10/14/2009  . COLONIC POLYPS, ADENOMATOUS, HX OF 10/14/2009  . HIATAL HERNIA WITH REFLUX 09/19/2009  . WEIGHT LOSS 09/19/2009  . PUD, HX OF 09/19/2009  . Hyperlipidemia 05/27/2008  . BACK PAIN 05/27/2008   Past Medical History:  Diagnosis Date  . Adenomatous colon polyp 2002   Colonoscopy   . Basal cell carcinoma    Nose  . Benign neoplasm of colon 2004   Colonoscopy  . CAD (coronary artery disease)    echo 09/13/10- EF>55%, aortic valve is mildly sclerotic; myoview  02-20-12-no ischemia  . Carotid bruit    carotid dopplers 05/16/04- normal  . Diverticulosis 2002,2004,2007   Colonoscopy  . Duodenitis   . Fibrocystic breast disease   . Gastritis   . GERD (gastroesophageal reflux disease)   . Heart attack ([place]) 1993   Cardiolyte stress---  stents 2012  . Hiatal hernia   . History of blood transfusion    pt denies  . Hyperlipidemia   . Peptic ulcer due to Helicobacter pylori   . PUD (peptic ulcer disease)     Family History  Problem Relation Age of Onset  . Heart disease Brother 69       MI  . Heart disease Sister        stents  . Neuropathy Sister   . Hypertension Sister   . Stroke Mother   . Heart disease Father   . Heart attack Father   . Stroke Maternal Grandfather   . Heart attack Paternal Grandmother   . Heart attack Paternal Grandfather   . Breast cancer Maternal Aunt        x 2 aunts  . Irritable bowel syndrome Brother   . Heart attack Brother   . Epilepsy Daughter   . Heart attack Paternal Aunt        x 9  .  Colon cancer Neg Hx     Past Surgical History:  Procedure Laterality Date  . ANGIOPLASTY  1993  . APPENDECTOMY  1957  . BASAL CELL CARCINOMA EXCISION     From the nose  . CATARACT EXTRACTION  10/26/09   Right eye  . CATARACT EXTRACTION  11/09/09   Left eye  . CHOLECYSTECTOMY  1984  . CORONARY STENT PLACEMENT  01/10/11   hig grade ramus branch stenosis with mod LAD dz, PCI/stenting of ramus branch with a Resolute DES   . DILATION AND CURETTAGE OF UTERUS    . FINGER SURGERY  04/2011   Cyst removal, right hand forefinger  . LEFT OOPHORECTOMY  1957  . SQUAMOUS CELL CARCINOMA EXCISION Right 07/24/2018   Posterior forearm shave & ED&C   Social History   Occupational History  . Occupation: retired Research scientist (physical sciences): RETIRED  Tobacco Use  . Smoking status: Former Smoker    Packs/day: 2.00    Years: 15.00    Pack years: 30.00    Types: Cigarettes    Last attempt to quit: 10/09/1975    Years since quitting:  43.0  . Smokeless tobacco: Never Used  Substance and Sexual Activity  . Alcohol use: No    Comment: none  . Drug use: No  . Sexual activity: Not Currently    Partners: Male

## 2018-12-01 DIAGNOSIS — H61001 Unspecified perichondritis of right external ear: Secondary | ICD-10-CM | POA: Diagnosis not present

## 2018-12-01 DIAGNOSIS — Z85828 Personal history of other malignant neoplasm of skin: Secondary | ICD-10-CM | POA: Diagnosis not present

## 2019-01-07 ENCOUNTER — Telehealth: Payer: Self-pay | Admitting: *Deleted

## 2019-01-07 NOTE — Telephone Encounter (Signed)
That is fine 

## 2019-01-07 NOTE — Telephone Encounter (Signed)
Patient would like to transfer to Dr Jerilee Hoh because her husband is a patient of hers.   Okay to transfer?

## 2019-01-08 NOTE — Telephone Encounter (Signed)
Spoke with patient and a WebEx appointment scheduled for TOC

## 2019-01-09 ENCOUNTER — Other Ambulatory Visit: Payer: Self-pay

## 2019-01-09 ENCOUNTER — Ambulatory Visit (INDEPENDENT_AMBULATORY_CARE_PROVIDER_SITE_OTHER): Payer: Medicare Other | Admitting: Internal Medicine

## 2019-01-09 DIAGNOSIS — I2510 Atherosclerotic heart disease of native coronary artery without angina pectoris: Secondary | ICD-10-CM

## 2019-01-09 DIAGNOSIS — I10 Essential (primary) hypertension: Secondary | ICD-10-CM

## 2019-01-09 DIAGNOSIS — Z8711 Personal history of peptic ulcer disease: Secondary | ICD-10-CM | POA: Diagnosis not present

## 2019-01-09 DIAGNOSIS — I2583 Coronary atherosclerosis due to lipid rich plaque: Secondary | ICD-10-CM | POA: Diagnosis not present

## 2019-01-09 DIAGNOSIS — E785 Hyperlipidemia, unspecified: Secondary | ICD-10-CM

## 2019-01-09 NOTE — Progress Notes (Signed)
Virtual Visit via Video Note  I connected with Rachel Nichols on 01/09/19 at 10:30 AM EDT by a video enabled telemedicine application and verified that I am speaking with the correct person using two identifiers.  Location patient: home Location provider: work office Persons participating in the virtual visit: patient, provider  I discussed the limitations of evaluation and management by telemedicine and the availability of in person appointments. The patient expressed understanding and agreed to proceed.   HPI: This visit is to establish care and to follow up on chronic medical conditions.  She is a remarkable 85 y/o lady who looks younger that her stated age. She has been self-quarantining at home due to COVID-19 but under normal circumstances she goes swimming at the [facility] twice a week, she also does all the house chores herself.  She used to be heavy smoker of 2.5 PPD but quit in 1977. No ETOH or illicit drugs. Her PMH is significant for:  1. CAD. MI in 1993 and 2012. Follows with cardiology, Dr. Claiborne Billings, annually. She is on metoprolol, ASA and statin.  2. HLD on crestor and zetia. LDL 56 in 5/19.  3. H/o PUD/GERD. No longer on PPIs and is asymptomatic.  For the past 2 weeks she has been dealing with a cold/sinus infection. She is getting better without treatment. No acute complaints today.   ROS: Constitutional: Denies fever, chills, diaphoresis, appetite change and fatigue.  HEENT: Denies photophobia, eye pain, redness, hearing loss, ear pain, congestion, sore throat, rhinorrhea, sneezing, mouth sores, trouble swallowing, neck pain, neck stiffness and tinnitus.   Respiratory: Denies SOB, DOE, cough, chest tightness,  and wheezing.   Cardiovascular: Denies chest pain, palpitations and leg swelling.  Gastrointestinal: Denies nausea, vomiting, abdominal pain, diarrhea, constipation, blood in stool and abdominal distention.  Genitourinary: Denies dysuria, urgency, frequency,  hematuria, flank pain and difficulty urinating.  Endocrine: Denies: hot or cold intolerance, sweats, changes in hair or nails, polyuria, polydipsia. Musculoskeletal: Denies myalgias, back pain, joint swelling, arthralgias and gait problem.  Skin: Denies pallor, rash and wound.  Neurological: Denies dizziness, seizures, syncope, weakness, light-headedness, numbness and headaches.  Hematological: Denies adenopathy. Easy bruising, personal or family bleeding history  Psychiatric/Behavioral: Denies suicidal ideation, mood changes, confusion, nervousness, sleep disturbance and agitation   Past Medical History:  Diagnosis Date  . Adenomatous colon polyp 2002   Colonoscopy   . Basal cell carcinoma    Nose  . Benign neoplasm of colon 2004   Colonoscopy  . CAD (coronary artery disease)    echo 09/13/10- EF>55%, aortic valve is mildly sclerotic; myoview 02-20-12-no ischemia  . Carotid bruit    carotid dopplers 05/16/04- normal  . Diverticulosis 2002,2004,2007   Colonoscopy  . Duodenitis   . Fibrocystic breast disease   . Gastritis   . GERD (gastroesophageal reflux disease)   . Heart attack ([place]) 1993   Cardiolyte stress---  stents 2012  . Hiatal hernia   . History of blood transfusion    pt denies  . Hyperlipidemia   . Peptic ulcer due to Helicobacter pylori   . PUD (peptic ulcer disease)     Past Surgical History:  Procedure Laterality Date  . ANGIOPLASTY  1993  . APPENDECTOMY  1957  . BASAL CELL CARCINOMA EXCISION     From the nose  . CATARACT EXTRACTION  10/26/09   Right eye  . CATARACT EXTRACTION  11/09/09   Left eye  . CHOLECYSTECTOMY  1984  . CORONARY STENT PLACEMENT  01/10/11  hig grade ramus branch stenosis with mod LAD dz, PCI/stenting of ramus branch with a Resolute DES   . DILATION AND CURETTAGE OF UTERUS    . FINGER SURGERY  04/2011   Cyst removal, right hand forefinger  . LEFT OOPHORECTOMY  1957  . SQUAMOUS CELL CARCINOMA EXCISION Right 07/24/2018   Posterior forearm  shave & ED&C    Family History  Problem Relation Age of Onset  . Heart disease Brother 25       MI  . Heart disease Sister        stents  . Neuropathy Sister   . Hypertension Sister   . Stroke Mother   . Heart disease Father   . Heart attack Father   . Stroke Maternal Grandfather   . Heart attack Paternal Grandmother   . Heart attack Paternal Grandfather   . Breast cancer Maternal Aunt        x 2 aunts  . Irritable bowel syndrome Brother   . Heart attack Brother   . Epilepsy Daughter   . Heart attack Paternal Aunt        x 9  . Colon cancer Neg Hx     SOCIAL HX:   reports that she quit smoking about 43 years ago. Her smoking use included cigarettes. She has a 30.00 pack-year smoking history. She has never used smokeless tobacco. She reports that she does not drink alcohol or use drugs.   Current Outpatient Medications:  .  aspirin 81 MG tablet, Take 81 mg by mouth daily., Disp: , Rfl:  .  cholecalciferol (VITAMIN D) 1000 UNITS tablet, Take 2,000 Units by mouth daily., Disp: , Rfl:  .  Coenzyme Q10 (CO Q10) 100 MG CAPS, Take 200 mg by mouth daily., Disp: , Rfl:  .  Cyanocobalamin (VITAMIN B 12 PO), Take 500 mg by mouth daily., Disp: , Rfl:  .  ezetimibe (ZETIA) 10 MG tablet, TAKE 1 TABLET (10 MG TOTAL) BY MOUTH DAILY., Disp: 90 tablet, Rfl: 3 .  metoprolol succinate (TOPROL-XL) 25 MG 24 hr tablet, TAKE 1/2 TABLET EVERY DAY, Disp: 45 tablet, Rfl: 3 .  Multiple Vitamins-Minerals (CENTRUM SILVER ULTRA WOMENS) TABS, Take 1 tablet by mouth daily., Disp: , Rfl:  .  rosuvastatin (CRESTOR) 10 MG tablet, TAKE 1 TABLET EVERY DAY, Disp: 90 tablet, Rfl: 3  Current Facility-Administered Medications:  .  triamcinolone acetonide (KENALOG) 10 MG/ML injection 10 mg, 10 mg, Other, Once, [place], [place], DPM  EXAM:   VITALS per patient if applicable: none reported  GENERAL: alert, oriented, appears well and in no acute distress  HEENT: atraumatic, conjunttiva clear, no obvious  abnormalities on inspection of external nose and ears  NECK: normal movements of the head and neck  LUNGS: on inspection no signs of respiratory distress, breathing rate appears normal, no obvious gross increased work of breathing, gasping or wheezing  CV: no obvious cyanosis  MS: moves all visible extremities without noticeable abnormality  PSYCH/NEURO: pleasant and cooperative, no obvious depression or anxiety, speech and thought processing grossly intact  ASSESSMENT AND PLAN:   Hyperlipidemia, unspecified hyperlipidemia type -Well controlled with LDL of 56 in 2019. -Continue statin/ezetimibe.  Essential hypertension -Per patient well controlled on metoprolol.  Coronary artery disease due to lipid rich plaque -Stable, no CP/DOE. -Continue statin, BB, ASA.  PUD, HX OF -Noted, not on PPI, currently asymptomatic.     I discussed the assessment and treatment plan with the patient. The patient was provided an opportunity to ask questions  and all were answered. The patient agreed with the plan and demonstrated an understanding of the instructions.   The patient was advised to call back or seek an in-person evaluation if the symptoms worsen or if the condition fails to improve as anticipated.    Lelon Frohlich, MD  [facility]

## 2019-03-20 ENCOUNTER — Telehealth (HOSPITAL_COMMUNITY): Payer: Self-pay | Admitting: Radiology

## 2019-03-20 NOTE — Telephone Encounter (Signed)
COVID-19 Pre-Screening Questions:  . Do you currently have a fever?No (yes = cancel and refer to pcp for e-visit) . Have you recently travelled on a cruise, internationally, or to [place], [place], [place], [place], [place], or [place], [place] ([place]) ?NO (yes = cancel, stay home, monitor symptoms, and contact pcp or initiate e-visit if symptoms develop) . Have you been in contact with someone that is currently pending confirmation of Covid19 testing or has been confirmed to have the Covid19 virus?NO (yes = cancel, stay home, away from tested individual, monitor symptoms, and contact pcp or initiate e-visit if symptoms develop) . Are you currently experiencing fatigue or coughNO (yes = pt should be prepared to have a mask placed at the time of their visit). . Reiterated no additional visitors. . Arrive no earlier than 15 minutes before appointment time. . Please bring own mask.    

## 2019-03-23 ENCOUNTER — Ambulatory Visit (HOSPITAL_COMMUNITY): Payer: Medicare Other | Attending: Cardiovascular Disease

## 2019-03-23 ENCOUNTER — Other Ambulatory Visit: Payer: Self-pay

## 2019-03-23 ENCOUNTER — Telehealth: Payer: Medicare Other | Admitting: Cardiology

## 2019-03-23 DIAGNOSIS — I2583 Coronary atherosclerosis due to lipid rich plaque: Secondary | ICD-10-CM | POA: Diagnosis not present

## 2019-03-23 DIAGNOSIS — I2510 Atherosclerotic heart disease of native coronary artery without angina pectoris: Secondary | ICD-10-CM | POA: Diagnosis not present

## 2019-03-23 DIAGNOSIS — R011 Cardiac murmur, unspecified: Secondary | ICD-10-CM | POA: Insufficient documentation

## 2019-03-30 ENCOUNTER — Telehealth: Payer: Self-pay | Admitting: Cardiology

## 2019-03-30 DIAGNOSIS — Z961 Presence of intraocular lens: Secondary | ICD-10-CM | POA: Diagnosis not present

## 2019-03-30 NOTE — Telephone Encounter (Signed)
home phone/ consent/ my chart declined/ pre reg completed °

## 2019-04-03 NOTE — Telephone Encounter (Signed)
I called pt to confirm her appt on 04-06-19 with Luke Kilroy. °

## 2019-04-06 ENCOUNTER — Telehealth: Payer: Self-pay

## 2019-04-06 ENCOUNTER — Other Ambulatory Visit: Payer: Self-pay

## 2019-04-06 ENCOUNTER — Encounter: Payer: Self-pay | Admitting: Cardiology

## 2019-04-06 ENCOUNTER — Telehealth (INDEPENDENT_AMBULATORY_CARE_PROVIDER_SITE_OTHER): Payer: Medicare Other | Admitting: Cardiology

## 2019-04-06 VITALS — BP 114/62 | HR 63 | Ht 64.0 in | Wt 142.0 lb

## 2019-04-06 DIAGNOSIS — I2510 Atherosclerotic heart disease of native coronary artery without angina pectoris: Secondary | ICD-10-CM

## 2019-04-06 DIAGNOSIS — E785 Hyperlipidemia, unspecified: Secondary | ICD-10-CM

## 2019-04-06 DIAGNOSIS — I10 Essential (primary) hypertension: Secondary | ICD-10-CM | POA: Diagnosis not present

## 2019-04-06 NOTE — Patient Instructions (Signed)
Medication Instructions:  Your physician recommends that you continue on your current medications as directed. Please refer to the Current Medication list given to you today. If you need a refill on your cardiac medications before your next appointment, please call your pharmacy.   Lab work: Your physician recommends that you return for lab work in: AT [place], CMET If you have labs (blood work) drawn today and your tests are completely normal, you will receive your results only by: Marland Kitchen MyChart Message (if you have MyChart) OR . A paper copy in the mail If you have any lab test that is abnormal or we need to change your treatment, we will call you to review the results.  Testing/Procedures: NONE   Follow-Up: At [facility], you and your health needs are our priority.  As part of our continuing mission to provide you with exceptional heart care, we have created designated Provider Care Teams.  These Care Teams include your primary Cardiologist (physician) and Advanced Practice Providers (APPs -  Physician Assistants and Nurse Practitioners) who all work together to provide you with the care you need, when you need it. You will need a follow up appointment in 6 months.  Please call our office 2 months in advance to schedule this appointment.  You may see Dr Shelva Majestic or one of the following Advanced Practice Providers on your designated Care Team: [place], [place] . Fabian Sharp, PA-C  Any Other Special Instructions Will Be Listed Below (If Applicable).

## 2019-04-06 NOTE — Progress Notes (Signed)
Virtual Visit via Telephone Note   This visit type was conducted due to national recommendations for restrictions regarding the COVID-19 Pandemic (e.g. social distancing) in an effort to limit this patient's exposure and mitigate transmission in our community.  Due to her co-morbid illnesses, this patient is at least at moderate risk for complications without adequate follow up.  This format is felt to be most appropriate for this patient at this time.  The patient did not have access to video technology/had technical difficulties with video requiring transitioning to audio format only (telephone).  All issues noted in this document were discussed and addressed.  No physical exam could be performed with this format.  Please refer to the patient's chart for her  consent to telehealth for [facility].   Date:  04/06/2019   ID:  Rachel Nichols, DOB Dec 16, 1930, MRN [id number]  Patient Location: Home Provider Location: Office  PCP:  Isaac Bliss, Rayford Halsted, MD  Cardiologist:  Dr Claiborne Billings Electrophysiologist:  None   Evaluation Performed:  Follow-Up Visit  Chief Complaint:  none  History of Present Illness:    Rachel Nichols is a 85 y.o. female with with a history of remote MI in 1993 treated with RCA PCI.  In 2012 she underwent ramus intermedius PCI with DES.  At that time she had a residual 60% LAD after the diagonal and a 40 to 50% mid RCA stenosis.  She is done well on medical therapy.  Myoview in May 2013 was negative for ischemia and she had an essentially normal echocardiogram in September 2016.  A repeat echo was done 03/23/2019.  It showed normal LVF, moderate LVH, and AOV sclerosis.  Dr. Claiborne Billings also follows her lipid therapy.  She was contacted today for 57-month follow-up.  She is been doing well since she saw [place] last.  During the [place] pandemic she and her husband have been isolating as much as possible and when they do go out she is wearing a mask.  She is due for lipids.  The patient does  not have symptoms concerning for COVID-19 infection (fever, chills, cough, or new shortness of breath).    Past Medical History:  Diagnosis Date  . Adenomatous colon polyp 2002   Colonoscopy   . Basal cell carcinoma    Nose  . Benign neoplasm of colon 2004   Colonoscopy  . CAD (coronary artery disease)    echo 09/13/10- EF>55%, aortic valve is mildly sclerotic; myoview 02-20-12-no ischemia  . Carotid bruit    carotid dopplers 05/16/04- normal  . Diverticulosis 2002,2004,2007   Colonoscopy  . Duodenitis   . Fibrocystic breast disease   . Gastritis   . GERD (gastroesophageal reflux disease)   . Heart attack ([place]) 1993   Cardiolyte stress---  stents 2012  . Hiatal hernia   . History of blood transfusion    pt denies  . Hyperlipidemia   . Peptic ulcer due to Helicobacter pylori   . PUD (peptic ulcer disease)    Past Surgical History:  Procedure Laterality Date  . ANGIOPLASTY  1993  . APPENDECTOMY  1957  . BASAL CELL CARCINOMA EXCISION     From the nose  . CATARACT EXTRACTION  10/26/09   Right eye  . CATARACT EXTRACTION  11/09/09   Left eye  . CHOLECYSTECTOMY  1984  . CORONARY STENT PLACEMENT  01/10/11   hig grade ramus branch stenosis with mod LAD dz, PCI/stenting of ramus branch with a Resolute DES   .  DILATION AND CURETTAGE OF UTERUS    . FINGER SURGERY  04/2011   Cyst removal, right hand forefinger  . LEFT OOPHORECTOMY  1957  . SQUAMOUS CELL CARCINOMA EXCISION Right 07/24/2018   Posterior forearm shave & ED&C     Current Meds  Medication Sig  . aspirin 81 MG tablet Take 81 mg by mouth daily.  . cholecalciferol (VITAMIN D) 1000 UNITS tablet Take 2,000 Units by mouth daily.  . Coenzyme Q10 (CO Q10) 100 MG CAPS Take 200 mg by mouth daily.  . Cyanocobalamin (VITAMIN B 12 PO) Take 500 mg by mouth daily.  Marland Kitchen ezetimibe (ZETIA) 10 MG tablet TAKE 1 TABLET (10 MG TOTAL) BY MOUTH DAILY.  . metoprolol succinate (TOPROL-XL) 25 MG 24 hr tablet TAKE 1/2 TABLET EVERY DAY  . Multiple  Vitamins-Minerals (CENTRUM SILVER ULTRA WOMENS) TABS Take 1 tablet by mouth daily.  . rosuvastatin (CRESTOR) 10 MG tablet TAKE 1 TABLET EVERY DAY   Current Facility-Administered Medications for the 04/06/19 encounter (Telemedicine) with Erlene Quan, PA-C  Medication  . triamcinolone acetonide (KENALOG) 10 MG/ML injection 10 mg     Allergies:   Diltiazem hcl, Tape, and Tramadol hcl   Social History   Tobacco Use  . Smoking status: Former Smoker    Packs/day: 2.00    Years: 15.00    Pack years: 30.00    Types: Cigarettes    Quit date: 10/09/1975    Years since quitting: 43.5  . Smokeless tobacco: Never Used  Substance Use Topics  . Alcohol use: No    Comment: none  . Drug use: No     Family Hx: The patient's family history includes Breast cancer in her maternal aunt; Epilepsy in her daughter; Heart attack in her brother, father, paternal aunt, paternal grandfather, and paternal grandmother; Heart disease in her father and sister; Heart disease (age of onset: 41) in her brother; Hypertension in her sister; Irritable bowel syndrome in her brother; Neuropathy in her sister; Stroke in her maternal grandfather and mother. There is no history of Colon cancer.  ROS:   Please see the history of present illness.    All other systems reviewed and are negative.   Prior CV studies:   The following studies were reviewed today: Echo 03/23/2019  Labs/Other Tests and Data Reviewed:    EKG:  No ECG reviewed.  Recent Labs: 06/30/2018: Hemoglobin 13.6; Platelets 170.0   Recent Lipid Panel Lab Results  Component Value Date/Time   CHOL 133 02/12/2018 10:54 AM   CHOL 130 07/26/2017 09:02 AM   TRIG 158.0 (H) 02/12/2018 10:54 AM   HDL 46.00 02/12/2018 10:54 AM   HDL 44 07/26/2017 09:02 AM   CHOLHDL 3 02/12/2018 10:54 AM   LDLCALC 56 02/12/2018 10:54 AM   LDLCALC 53 07/26/2017 09:02 AM    Wt Readings from Last 3 Encounters:  04/06/19 142 lb (64.4 kg)  09/09/18 145 lb 9.6 oz (66 kg)   06/30/18 146 lb 1.6 oz (66.3 kg)     Objective:    Vital Signs:  BP 114/62   Pulse 63   Ht 5\' 4"  (1.626 m)   Wt 142 lb (64.4 kg)   BMI 24.37 kg/m    VITAL SIGNS:  reviewed  ASSESSMENT & PLAN:    CAD s/p PCI- H/O MI-RCA PCI 1993 Cath/ PCI with DES to RI April 2012. Residual 60% LAD after Dx and 40-50 m RCA-medical Rx Myoview May 2013 Normal LVF by echo Sept 2016  HLD- LDL 56  May 2019 (slighltly elevated AST in 2018)  HTN- Controlled on Toprol 25 mg  COVID-19 Education: The signs and symptoms of COVID-19 were discussed with the patient and how to seek care for testing (follow up with PCP or arrange E-visit).  The importance of social distancing was discussed today.  Time:   Today, I have spent 15 minutes with the patient with telehealth technology discussing the above problems.     Medication Adjustments/Labs and Tests Ordered: Current medicines are reviewed at length with the patient today.  Concerns regarding medicines are outlined above.   Tests Ordered: No orders of the defined types were placed in this encounter.   Medication Changes: No orders of the defined types were placed in this encounter.   Follow Up:  In Person Check CMET (slighltly elevated AST in 2018) and lipids.  F/U Dr Claiborne Billings in 6 months.  Angelena Form, PA-C  04/06/2019 9:01 AM    [facility]

## 2019-04-06 NOTE — Telephone Encounter (Signed)
Contacted patient to discuss AVS Instructions. Gave patient Rachel Nichols's recommendations from today's virtual office visit. Informed patient that someone from the scheduling dept will be in contact with them to schedule their follow up appt. Patient voiced understanding and AVS mailed.    

## 2019-04-08 NOTE — Telephone Encounter (Addendum)
VERBAL CONSENT GIVEN TO [place] ON 04/03/2019 AT 12:25PM.      Virtual Visit Pre-Appointment Phone Call  "Rachel Nichols, I am calling you today to discuss your upcoming appointment. We are currently trying to limit exposure to the virus that causes COVID-19 by seeing patients at home rather than in the office."  1. "What is the BEST phone number to call the day of the visit?" - include this in appointment notes  2. "Do you have or have access to (through a family member/friend) a smartphone with video capability that we can use for your visit?" a. If yes - list this number in appt notes as "cell" (if different from BEST phone #) and list the appointment type as a VIDEO visit in appointment notes b. If no - list the appointment type as a PHONE visit in appointment notes  3. Confirm consent - "In the setting of the current Covid19 crisis, you are scheduled for a (phone or video) visit with your provider on (date) at (time).  Just as we do with many in-office visits, in order for you to participate in this visit, we must obtain consent.  If you'd like, I can send this to your mychart (if signed up) or email for you to review.  Otherwise, I can obtain your verbal consent now.  All virtual visits are billed to your insurance company just like a normal visit would be.  By agreeing to a virtual visit, we'd like you to understand that the technology does not allow for your provider to perform an examination, and thus may limit your provider's ability to fully assess your condition. If your provider identifies any concerns that need to be evaluated in person, we will make arrangements to do so.  Finally, though the technology is pretty good, we cannot assure that it will always work on either your or our end, and in the setting of a video visit, we may have to convert it to a phone-only visit.  In either situation, we cannot ensure that we have a secure connection.  Are you willing to proceed?" STAFF: Did  the patient verbally acknowledge consent to telehealth visit? Document YES/NO here: YES  4. Advise patient to be prepared - "Two hours prior to your appointment, go ahead and check your blood pressure, pulse, oxygen saturation, and your weight (if you have the equipment to check those) and write them all down. When your visit starts, your provider will ask you for this information. If you have an Apple Watch or Kardia device, please plan to have heart rate information ready on the day of your appointment. Please have a pen and paper handy nearby the day of the visit as well."  5. Give patient instructions for MyChart download to smartphone OR Doximity/Doxy.me as below if video visit (depending on what platform provider is using)  6. Inform patient they will receive a phone call 15 minutes prior to their appointment time (may be from unknown caller ID) so they should be prepared to answer    TELEPHONE CALL NOTE  Rachel Nichols has been deemed a candidate for a follow-up tele-health visit to limit community exposure during the Covid-19 pandemic. I spoke with the patient via phone to ensure availability of phone/video source, confirm preferred email & phone number, and discuss instructions and expectations.  I reminded Rachel Nichols to be prepared with any vital sign and/or heart rhythm information that could potentially be obtained via home monitoring, at the time of  her visit. I reminded Rachel Nichols to expect a phone call prior to her visit.  Harold Hedge, CMA 04/08/2019 3:55 PM   INSTRUCTIONS FOR DOWNLOADING THE MYCHART APP TO SMARTPHONE  - The patient must first make sure to have activated MyChart and know their login information - If Apple, go to [employer] and type in MyChart in the search bar and download the app. If Android, ask patient to go to [employer] and type in [place] in the search bar and download the app. The app is free but as with any other app downloads, their phone may  require them to verify saved payment information or Apple/Android password.  - The patient will need to then log into the app with their MyChart username and password, and select [facility] as their healthcare provider to link the account. When it is time for your visit, go to the MyChart app, find appointments, and click Begin Video Visit. Be sure to Select Allow for your device to access the Microphone and Camera for your visit. You will then be connected, and your provider will be with you shortly.  **If they have any issues connecting, or need assistance please contact MyChart service desk [phone])**  **If using a computer, in order to ensure the best quality for their visit they will need to use either of the following Internet Browsers: [employer], or Google Chrome**  IF USING DOXIMITY or DOXY.ME - The patient will receive a link just prior to their visit by text.     FULL LENGTH CONSENT FOR TELE-HEALTH VISIT   I hereby voluntarily request, consent and authorize [employer] and its employed or contracted physicians, physician assistants, nurse practitioners or other licensed health care professionals (the Practitioner), to provide me with telemedicine health care services (the "Services") as deemed necessary by the treating Practitioner. I acknowledge and consent to receive the Services by the Practitioner via telemedicine. I understand that the telemedicine visit will involve communicating with the Practitioner through live audiovisual communication technology and the disclosure of certain medical information by electronic transmission. I acknowledge that I have been given the opportunity to request an in-person assessment or other available alternative prior to the telemedicine visit and am voluntarily participating in the telemedicine visit.  I understand that I have the right to withhold or withdraw my consent to the use of telemedicine in the course of my care at  any time, without affecting my right to future care or treatment, and that the Practitioner or I may terminate the telemedicine visit at any time. I understand that I have the right to inspect all information obtained and/or recorded in the course of the telemedicine visit and may receive copies of available information for a reasonable fee.  I understand that some of the potential risks of receiving the Services via telemedicine include:  Marland Kitchen Delay or interruption in medical evaluation due to technological equipment failure or disruption; . Information transmitted may not be sufficient (e.g. poor resolution of images) to allow for appropriate medical decision making by the Practitioner; and/or  . In rare instances, security protocols could fail, causing a breach of personal health information.  Furthermore, I acknowledge that it is my responsibility to provide information about my medical history, conditions and care that is complete and accurate to the best of my ability. I acknowledge that Practitioner's advice, recommendations, and/or decision may be based on factors not within their control, such as incomplete or inaccurate data provided by  me or distortions of diagnostic images or specimens that may result from electronic transmissions. I understand that the practice of medicine is not an exact science and that Practitioner makes no warranties or guarantees regarding treatment outcomes. I acknowledge that I will receive a copy of this consent concurrently upon execution via email to the email address I last provided but may also request a printed copy by calling the office of [facility].    I understand that my insurance will be billed for this visit.   I have read or had this consent read to me. . I understand the contents of this consent, which adequately explains the benefits and risks of the Services being provided via telemedicine.  . I have been provided ample opportunity to ask questions  regarding this consent and the Services and have had my questions answered to my satisfaction. . I give my informed consent for the services to be provided through the use of telemedicine in my medical care  By participating in this telemedicine visit I agree to the above.

## 2019-04-09 DIAGNOSIS — I10 Essential (primary) hypertension: Secondary | ICD-10-CM | POA: Diagnosis not present

## 2019-04-09 DIAGNOSIS — E785 Hyperlipidemia, unspecified: Secondary | ICD-10-CM | POA: Diagnosis not present

## 2019-04-09 DIAGNOSIS — I2510 Atherosclerotic heart disease of native coronary artery without angina pectoris: Secondary | ICD-10-CM | POA: Diagnosis not present

## 2019-04-09 DIAGNOSIS — Z9861 Coronary angioplasty status: Secondary | ICD-10-CM | POA: Diagnosis not present

## 2019-04-10 LAB — COMPREHENSIVE METABOLIC PANEL
ALT: 26 IU/L (ref 0–32)
AST: 30 IU/L (ref 0–40)
Albumin/Globulin Ratio: 2 (ref 1.2–2.2)
Albumin: 4.7 g/dL — ABNORMAL HIGH (ref 3.6–4.6)
Alkaline Phosphatase: 52 IU/L (ref 39–117)
BUN/Creatinine Ratio: 17 (ref 12–28)
BUN: 15 mg/dL (ref 8–27)
Bilirubin Total: 0.5 mg/dL (ref 0.0–1.2)
CO2: 24 mmol/L (ref 20–29)
Calcium: 10 mg/dL (ref 8.7–10.3)
Chloride: 106 mmol/L (ref 96–106)
Creatinine, Ser: 0.9 mg/dL (ref 0.57–1.00)
GFR calc Af Amer: 67 mL/min/{1.73_m2} (ref >59)
GFR calc non Af Amer: 58 mL/min/{1.73_m2} — ABNORMAL LOW (ref >59)
Globulin, Total: 2.4 g/dL (ref 1.5–4.5)
Glucose: 114 mg/dL — ABNORMAL HIGH (ref 65–99)
Potassium: 4.9 mmol/L (ref 3.5–5.2)
Sodium: 144 mmol/L (ref 134–144)
Total Protein: 7.1 g/dL (ref 6.0–8.5)

## 2019-04-10 LAB — LIPID PANEL
Chol/HDL Ratio: 2.8 ratio (ref 0.0–4.4)
Cholesterol, Total: 135 mg/dL (ref 100–199)
HDL: 48 mg/dL (ref >39)
LDL Calculated: 57 mg/dL (ref 0–99)
Triglycerides: 148 mg/dL (ref 0–149)
VLDL Cholesterol Cal: 30 mg/dL (ref 5–40)

## 2019-04-21 ENCOUNTER — Encounter: Payer: Self-pay | Admitting: *Deleted

## 2019-05-03 ENCOUNTER — Ambulatory Visit (INDEPENDENT_AMBULATORY_CARE_PROVIDER_SITE_OTHER): Payer: Medicare Other

## 2019-05-03 ENCOUNTER — Other Ambulatory Visit: Payer: Self-pay

## 2019-05-03 ENCOUNTER — Encounter (HOSPITAL_COMMUNITY): Payer: Self-pay

## 2019-05-03 ENCOUNTER — Ambulatory Visit (HOSPITAL_COMMUNITY)
Admission: EM | Admit: 2019-05-03 | Discharge: 2019-05-03 | Disposition: A | Discharge status: Home / Self Care | Payer: Medicare Other | Attending: Emergency Medicine | Admitting: Emergency Medicine

## 2019-05-03 DIAGNOSIS — M545 Low back pain, unspecified: Secondary | ICD-10-CM

## 2019-05-03 DIAGNOSIS — K5900 Constipation, unspecified: Secondary | ICD-10-CM | POA: Diagnosis not present

## 2019-05-03 MED ORDER — GLYCERIN (ADULT) 2.1 G RE SUPP: 1.0000 | Freq: Two times a day (BID) | RECTAL | 0 refills | Status: DC | PRN

## 2019-05-03 MED ORDER — MAGNESIUM HYDROXIDE 400 MG/5ML PO SUSP: 5.0000 mL | Freq: Every day | ORAL | 0 refills | Status: DC | PRN

## 2019-05-03 NOTE — Discharge Instructions (Addendum)
X ray normal Please  take tylenol 500 mg every 4-6 hours for back pain  For constipation may try glycerin suppositories, or fleet enema Magnesium citrate or milk of magnesium  Please follow up if still not moving bowels with the above, developing abdominal pain, nausea or vomiting

## 2019-05-03 NOTE — ED Triage Notes (Signed)
Pt states she is constipated x 4 days. Pt fell 3 days ago. Pt cc right foot and left hip. Pt states she got up over in the night and lost her balance and just slipped and fell in the floor.

## 2019-05-03 NOTE — ED Provider Notes (Signed)
[name]    CSN: [id number] Arrival date & time: 05/03/19  1005     History   Chief Complaint Chief Complaint  Patient presents with  . Constipation    HPI Rachel Nichols is a 85 y.o. female history of CAD, diverticulosis, GERD, peptic ulcer disease, presenting today for evaluation of back pain and constipation.  Patient states that 3 days ago she had a fall.  She attempted to get up at 3 AM to use the restroom, slipped on her laminate floors and landed on her left side.  She denies hitting head or losing consciousness.  Her husband had to help her up off the floor.  She has had pain in her lower back as well as in her right little toe.  She does not feel she broke anything in her foot.  She has had a lot of discomfort in her back with movement.  She denies headache, vision changes, difficulty speaking, confusion or weakness.  Denies weakness on one side.  Since the fall she has had difficulty moving her bowels.  She states that prior she was going daily without straining.  She has tried using MiraLAX with 2 Colace for 2 days without relief.  She tried taking 2 Dulcolax.  She also takes daily probiotics.  She denies difficulty controlling urine.  Denies numbness or tingling.  Denies saddle anesthesia.  HPI  Past Medical History:  Diagnosis Date  . Adenomatous colon polyp 2002   Colonoscopy   . Benign neoplasm of colon 2004   Colonoscopy  . CAD (coronary artery disease)    echo 09/13/10- EF>55%, aortic valve is mildly sclerotic; myoview 02-20-12-no ischemia  . Carotid bruit    carotid dopplers 05/16/04- normal  . Diverticulosis 2002,2004,2007   Colonoscopy  . Duodenitis   . Fibrocystic breast disease   . Gastritis   . GERD (gastroesophageal reflux disease)   . Heart attack ([place]) 1993   Cardiolyte stress---  stents 2012  . Hiatal hernia   . History of blood transfusion    pt denies  . Hyperlipidemia   . Peptic ulcer due to Helicobacter pylori   . PUD (peptic ulcer  disease)   . SCC (squamous cell carcinoma) 04/24/2011   right jawline (CX35FU), left forearm (CX35FU)    Patient Active Problem List   Diagnosis Date Noted  . Hyperlipidemia LDL goal <70 01/13/2015  . Murmur 01/13/2015  . RUQ abdominal pain 01/22/2014  . Pancreatic cyst 01/22/2014  . Essential hypertension 03/06/2013  . CAD S/P percutaneous coronary angioplasty 04/14/2012  . GASTRITIS 10/19/2009  . ABDOMINAL PAIN-EPIGASTRIC 10/18/2009  . CHOLECYSTECTOMY, HX OF 10/18/2009  . DIVERTICULOSIS, COLON 10/14/2009  . COLONIC POLYPS, ADENOMATOUS, HX OF 10/14/2009  . HIATAL HERNIA WITH REFLUX 09/19/2009  . WEIGHT LOSS 09/19/2009  . PUD, HX OF 09/19/2009  . BACK PAIN 05/27/2008    Past Surgical History:  Procedure Laterality Date  . ANGIOPLASTY  1993  . APPENDECTOMY  1957  . BASAL CELL CARCINOMA EXCISION     From the nose  . CATARACT EXTRACTION  10/26/09   Right eye  . CATARACT EXTRACTION  11/09/09   Left eye  . CHOLECYSTECTOMY  1984  . CORONARY STENT PLACEMENT  01/10/11   hig grade ramus branch stenosis with mod LAD dz, PCI/stenting of ramus branch with a Resolute DES   . DILATION AND CURETTAGE OF UTERUS    . FINGER SURGERY  04/2011   Cyst removal, right hand forefinger  . LEFT OOPHORECTOMY  [place] CELL CARCINOMA EXCISION Right 07/24/2018   Posterior forearm shave & ED&C    OB History   No obstetric history on file.      Home Medications    Prior to Admission medications   Medication Sig Start Date End Date Taking? Authorizing Provider  aspirin 81 MG tablet Take 81 mg by mouth daily.    [provider]  cholecalciferol (VITAMIN D) 1000 UNITS tablet Take 2,000 Units by mouth daily.    [provider]  Coenzyme Q10 (CO Q10) 100 MG CAPS Take 200 mg by mouth daily.    [provider]  Cyanocobalamin (VITAMIN B 12 PO) Take 500 mg by mouth daily.    [provider]  ezetimibe (ZETIA) 10 MG tablet TAKE 1 TABLET (10 MG TOTAL) BY MOUTH  DAILY. 07/02/18   Troy Sine, MD  Glycerin, Laxative, (GLYCERIN, ADULT,) 2.1 g SUPP Place 1 suppository rectally 2 (two) times daily as needed (constipation). 05/03/19   Pastor Sgro C, PA-C  magnesium hydroxide (MILK OF MAGNESIA) 400 MG/5ML suspension Take 5 mLs by mouth daily as needed for mild constipation. 05/03/19   Kason Benak C, PA-C  metoprolol succinate (TOPROL-XL) 25 MG 24 hr tablet TAKE 1/2 TABLET EVERY DAY 07/02/18   Troy Sine, MD  Multiple Vitamins-Minerals (CENTRUM SILVER ULTRA WOMENS) TABS Take 1 tablet by mouth daily.    [provider]  rosuvastatin (CRESTOR) 10 MG tablet TAKE 1 TABLET EVERY DAY 07/02/18   Troy Sine, MD    Family History Family History  Problem Relation Age of Onset  . Heart disease Brother 51       MI  . Heart disease Sister        stents  . Neuropathy Sister   . Hypertension Sister   . Stroke Mother   . Heart disease Father   . Heart attack Father   . Stroke Maternal Grandfather   . Heart attack Paternal Grandmother   . Heart attack Paternal Grandfather   . Breast cancer Maternal Aunt        x 2 aunts  . Irritable bowel syndrome Brother   . Heart attack Brother   . Epilepsy Daughter   . Heart attack Paternal Aunt        x 9  . Colon cancer Neg Hx     Social History Social History   Tobacco Use  . Smoking status: Former Smoker    Packs/day: 2.00    Years: 15.00    Pack years: 30.00    Types: Cigarettes    Quit date: 10/09/1975    Years since quitting: 43.5  . Smokeless tobacco: Never Used  Substance Use Topics  . Alcohol use: No    Comment: none  . Drug use: No     Allergies   Diltiazem hcl, Tape, and Tramadol hcl   Review of Systems Review of Systems  Constitutional: Negative for fatigue and fever.  HENT: Negative for congestion, sinus pressure and sore throat.   Eyes: Negative for photophobia, pain and visual disturbance.  Respiratory: Negative for cough and shortness of breath.    Cardiovascular: Negative for chest pain.  Gastrointestinal: Positive for constipation. Negative for abdominal pain, nausea and vomiting.  Genitourinary: Negative for decreased urine volume and hematuria.  Musculoskeletal: Positive for back pain and myalgias. Negative for neck pain and neck stiffness.  Neurological: Negative for dizziness, syncope, facial asymmetry, speech difficulty, weakness, light-headedness, numbness and headaches.  Physical Exam Triage Vital Signs ED Triage Vitals  Enc Vitals Group     BP 05/03/19 1025 117/81     Pulse Rate 05/03/19 1025 70     Resp 05/03/19 1025 16     Temp 05/03/19 1025 98.5 F (36.9 C)     Temp Source 05/03/19 1025 Oral     SpO2 05/03/19 1025 98 %     Weight 05/03/19 1022 143 lb (64.9 kg)     Height --      Head Circumference --      Peak Flow --      Pain Score --      Pain Loc --      Pain Edu? --      Excl. in [place]? --    No data found.  Updated Vital Signs BP 117/81 (BP Location: Right Arm)   Pulse 70   Temp 98.5 F (36.9 C) (Oral)   Resp 16   Wt 143 lb (64.9 kg)   SpO2 98%   BMI 24.55 kg/m   Visual Acuity Right Eye Distance:   Left Eye Distance:   Bilateral Distance:    Right Eye Near:   Left Eye Near:    Bilateral Near:     Physical Exam Vitals signs and nursing note reviewed.  Constitutional:      General: She is not in acute distress.    Appearance: She is well-developed.  HENT:     Head: Normocephalic and atraumatic.  Eyes:     Extraocular Movements: Extraocular movements intact.     Conjunctiva/sclera: Conjunctivae normal.     Pupils: Pupils are equal, round, and reactive to light.  Neck:     Musculoskeletal: Neck supple.  Cardiovascular:     Rate and Rhythm: Normal rate and regular rhythm.     Heart sounds: No murmur.  Pulmonary:     Effort: Pulmonary effort is normal. No respiratory distress.     Breath sounds: Normal breath sounds.  Abdominal:     Palpations: Abdomen is soft.     Tenderness:  There is no abdominal tenderness.     Comments: Well-healed surgical scar in right upper quadrant.  Abdomen soft, slightly distended.  Nontender to light and deep palpation throughout abdomen  Musculoskeletal:     Comments: Nontender to palpation of cervical, thoracic and lumbar spine midline, nonreproducible tenderness to bilateral lumbar regions.  Patient has significant discomfort in back with lying supine.  Strength in hips and knees 5/5 and equal bilaterally, patellar reflex 2+ bilaterally  Has small bruise noted to the right little toe Nontender throughout dorsum of right foot  Skin:    General: Skin is warm and dry.  Neurological:     General: No focal deficit present.     Mental Status: She is alert and oriented to person, place, and time. Mental status is at baseline.      UC Treatments / Results  Labs (all labs ordered are listed, but only abnormal results are displayed) Labs Reviewed - No data to display  EKG   Radiology Dg Lumbar Spine Complete  Result Date: 05/03/2019 CLINICAL DATA:  Acute low back pain following fall 3 days ago. Initial encounter. EXAM: LUMBAR SPINE - COMPLETE 4+ VIEW COMPARISON:  01/13/2014 CT FINDINGS: No acute fracture or subluxation. Mild to moderate multilevel degenerative disc disease/spondylosis noted. No focal bony lesions or spondylolysis identified. Aortic atherosclerotic calcifications noted. IMPRESSION: 1. No acute abnormality 2. Mild to moderate multilevel degenerative disc disease 3.  Aortic Atherosclerosis (ICD10-I70.0). Electronically Signed   By: Margarette Canada M.D.   On: 05/03/2019 11:24    Procedures Procedures (including critical care time)  Medications Ordered in UC Medications - No data to display  Initial Impression / Assessment and Plan / UC Course  I have reviewed the triage vital signs and the nursing notes.  Pertinent labs & imaging results that were available during my care of the patient were reviewed by me and  considered in my medical decision making (see chart for details).     X-ray negative for acute bony abnormality of lumbar spine.  Do not suspect underlying bowel obstruction at this time, without abdominal pain nausea or vomiting.  Strength intact, no numbness or tingling.  Do not suspect cauda equina.  Will treat constipation with suppositories and enemas, also milk of magnesia as alternative to MiraLAX/Colace combo.  Advised to follow-up if she does not have relief with this and does not start moving her bowels in the next 48 hours.  Recommended Tylenol for back pain.  Follow-up if developing abdominal pain nausea or vomiting. Final Clinical Impressions(s) / UC Diagnoses   Final diagnoses:  Acute left-sided low back pain without sciatica     Discharge Instructions     X ray normal Please  take tylenol 500 mg every 4-6 hours for back pain  For constipation may try glycerin suppositories, or fleet enema Magnesium citrate or milk of magnesium  Please follow up if still not moving bowels with the above, developing abdominal pain, nausea or vomiting    ED Prescriptions    Medication Sig Dispense Auth. Provider   Glycerin, Laxative, (GLYCERIN, ADULT,) 2.1 g SUPP Place 1 suppository rectally 2 (two) times daily as needed (constipation). 12 suppository Kimmy Totten C, PA-C   magnesium hydroxide (MILK OF MAGNESIA) 400 MG/5ML suspension Take 5 mLs by mouth daily as needed for mild constipation. 118 mL Sabastion Hrdlicka C, PA-C     Controlled Substance Prescriptions [place] Controlled Substance Registry consulted? Not Applicable   Janith Lima, [place] 05/03/19 1501

## 2019-05-06 DIAGNOSIS — M545 Low back pain: Secondary | ICD-10-CM | POA: Diagnosis not present

## 2019-05-11 ENCOUNTER — Other Ambulatory Visit: Payer: Self-pay

## 2019-05-20 DIAGNOSIS — M545 Low back pain: Secondary | ICD-10-CM | POA: Diagnosis not present

## 2019-05-22 ENCOUNTER — Other Ambulatory Visit: Payer: Self-pay | Admitting: Cardiovascular Disease

## 2019-05-22 DIAGNOSIS — E785 Hyperlipidemia, unspecified: Secondary | ICD-10-CM

## 2019-06-01 DIAGNOSIS — L820 Inflamed seborrheic keratosis: Secondary | ICD-10-CM | POA: Diagnosis not present

## 2019-06-01 DIAGNOSIS — D485 Neoplasm of uncertain behavior of skin: Secondary | ICD-10-CM | POA: Diagnosis not present

## 2019-06-01 DIAGNOSIS — L821 Other seborrheic keratosis: Secondary | ICD-10-CM | POA: Diagnosis not present

## 2019-06-01 DIAGNOSIS — Z85828 Personal history of other malignant neoplasm of skin: Secondary | ICD-10-CM | POA: Diagnosis not present

## 2019-06-17 DIAGNOSIS — M545 Low back pain: Secondary | ICD-10-CM | POA: Diagnosis not present

## 2019-09-29 ENCOUNTER — Other Ambulatory Visit: Payer: Self-pay

## 2019-09-29 ENCOUNTER — Encounter (INDEPENDENT_AMBULATORY_CARE_PROVIDER_SITE_OTHER): Payer: Self-pay

## 2019-09-29 ENCOUNTER — Ambulatory Visit (INDEPENDENT_AMBULATORY_CARE_PROVIDER_SITE_OTHER): Payer: Medicare Other | Admitting: Cardiovascular Disease

## 2019-09-29 ENCOUNTER — Encounter: Payer: Self-pay | Admitting: Cardiovascular Disease

## 2019-09-29 VITALS — BP 120/65 | HR 59 | Temp 97.9°F | Ht 64.0 in | Wt 138.0 lb

## 2019-09-29 DIAGNOSIS — I358 Other nonrheumatic aortic valve disorders: Secondary | ICD-10-CM | POA: Diagnosis not present

## 2019-09-29 DIAGNOSIS — I10 Essential (primary) hypertension: Secondary | ICD-10-CM | POA: Diagnosis not present

## 2019-09-29 DIAGNOSIS — Z9861 Coronary angioplasty status: Secondary | ICD-10-CM

## 2019-09-29 DIAGNOSIS — E785 Hyperlipidemia, unspecified: Secondary | ICD-10-CM | POA: Diagnosis not present

## 2019-09-29 DIAGNOSIS — I2510 Atherosclerotic heart disease of native coronary artery without angina pectoris: Secondary | ICD-10-CM

## 2019-09-29 NOTE — Progress Notes (Signed)
Patient ID: Rachel Nichols, female   DOB: 1931-09-21, 85 y.o.   MRN: [id number]       HPI: Rachel Nichols, is a 85 y.o. female who presents to the office for a 12 month cardiology evaluation.  Rachel Nichols is  suffered a myocardial infarction in January 1993 and underwent PTCA of her RCA. In April 2012, a 2.5x12 mm Resolute DES stent was inserted to the proximal portion of a ramus intermediate vessel. She  had 60% LAD stenosis after diagonal vessel as well as 40-50% mid right coronary artery stenosis for which she's been treated medically. A myocardial perfusion study in May 2013  remained normal. She has continued to remain stable on her current medical regimen. We had increased her Crestor to 10 mg to take in addition to this area there LDL particle concentration was still elevated at 1387. Subsequent blood work on 11/10/2012 showed marked improvement with her LDL now at 61 and LDL particle number at 1027 the total cholesterol was 126, HDL 43, triglycerides 108. Her insulin resistance score was still slightly elevated at 56.  WhenI saw her in April 2016 for 18 month follow-up evaluation, her cardiac murmur was more pronounced.  I scheduled her for an echo Doppler study.  This was done on 06/27/2015.  LV function was normal with an ejection fraction of 65-70%.  There was grade 1 diastolic dysfunction.  There were no wall motion abnormalities.  She had mild left atrial dilatation.  She was not found to have significant valvular pathology.   I  saw her in October 2018 at which time she was continuing to do well. Specifically, she denied an any chest pain or palpitations.  She remained active and was swimming several days per week.  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  She was on metoprolol 12.5 mg daily and had reduced to rosuvastatin to only 5 mg but was continued to take Zetia.  She had noticed myalgias on higher dose and was also taking coenzyme Q  10.  When I last saw her, I recommended follow-up laboratory.  Lipid studies in May 2019  showed a total cholesterol of 133, triglycerides 158, HDL 46, and LDL 56.  Her triglycerides had improved from a previous high of 194.  Hemoglobin A1c was 6.4.    I last saw her in December 2019.  Since I saw her, she underwent a follow-up echo Doppler study in June 2020 which showed normal systolic and diastolic function with EF at 60 to 65%.  There was mild aortic sclerosis without stenosis.  She had a telemedicine evaluation with Kerin Ransom in follow-up of that echo.  Presently, she has remained stable and feels well.  She continues to go to a pool and walks labs.  She no longer swims.  She denies any chest pain or palpitations.  She is sleeping fairly well and does not have any daytime sleepiness although does take an occasional nap.  She is unaware of any anginal type symptoms.  She denies presyncope or syncope.  She has continued to be on Zetia and rosuvastatin 10 mg for hyperlipidemia.  Most recent lipid studies in July 2020 showed a total cholesterol 135 and LDL cholesterol of 57.  She has normal renal function with a creatinine of 0.9.  Hemoglobin A1c in 2019 was in the prediabetic range at 6.4.  She is now followed by Dr. Thersa Salt for primary care.  She  presents for your evaluation with me.  Past Medical History:  Diagnosis Date  . Adenomatous colon polyp 2002   Colonoscopy   . Benign neoplasm of colon 2004   Colonoscopy  . CAD (coronary artery disease)    echo 09/13/10- EF>55%, aortic valve is mildly sclerotic; myoview 02-20-12-no ischemia  . Carotid bruit    carotid dopplers 05/16/04- normal  . Diverticulosis 2002,2004,2007   Colonoscopy  . Duodenitis   . Fibrocystic breast disease   . Gastritis   . GERD (gastroesophageal reflux disease)   . Heart attack ([place]) 1993   Cardiolyte stress---  stents 2012  . Hiatal hernia   . History of blood transfusion    pt denies  .  Hyperlipidemia   . Peptic ulcer due to Helicobacter pylori   . PUD (peptic ulcer disease)   . SCC (squamous cell carcinoma) 04/24/2011   right jawline (CX35FU), left forearm (CX35FU)    Past Surgical History:  Procedure Laterality Date  . ANGIOPLASTY  1993  . APPENDECTOMY  1957  . BASAL CELL CARCINOMA EXCISION     From the nose  . CATARACT EXTRACTION  10/26/09   Right eye  . CATARACT EXTRACTION  11/09/09   Left eye  . CHOLECYSTECTOMY  1984  . CORONARY STENT PLACEMENT  01/10/11   hig grade ramus branch stenosis with mod LAD dz, PCI/stenting of ramus branch with a Resolute DES   . DILATION AND CURETTAGE OF UTERUS    . FINGER SURGERY  04/2011   Cyst removal, right hand forefinger  . LEFT OOPHORECTOMY  1957  . SQUAMOUS CELL CARCINOMA EXCISION Right 07/24/2018   Posterior forearm shave & ED&C    Allergies  Allergen Reactions  . Diltiazem Hcl   . Tape Other (See Comments)    It pulls the skin off.  . Tramadol Hcl     Current Outpatient Medications  Medication Sig Dispense Refill  . aspirin 81 MG tablet Take 81 mg by mouth daily.    . cholecalciferol (VITAMIN D) 1000 UNITS tablet Take 2,000 Units by mouth daily.    . Coenzyme Q10 (CO Q10) 100 MG CAPS Take 200 mg by mouth daily.    . Cyanocobalamin (VITAMIN B 12 PO) Take 500 mg by mouth daily.    Marland Kitchen ezetimibe (ZETIA) 10 MG tablet TAKE 1 TABLET DAILY. 90 tablet 3  . Glycerin, Laxative, (GLYCERIN, ADULT,) 2.1 g SUPP Place 1 suppository rectally 2 (two) times daily as needed (constipation). 12 suppository 0  . magnesium hydroxide (MILK OF MAGNESIA) 400 MG/5ML suspension Take 5 mLs by mouth daily as needed for mild constipation. 118 mL 0  . metoprolol succinate (TOPROL-XL) 25 MG 24 hr tablet TAKE 1/2 TABLET EVERY DAY 45 tablet 3  . Multiple Vitamins-Minerals (CENTRUM SILVER ULTRA WOMENS) TABS Take 1 tablet by mouth daily.    . rosuvastatin (CRESTOR) 10 MG tablet TAKE 1 TABLET EVERY DAY 90 tablet 3   Current Facility-Administered  Medications  Medication Dose Route Frequency Provider Last Rate Last Admin  . triamcinolone acetonide (KENALOG) 10 MG/ML injection 10 mg  10 mg Other Once Landis Martins, DPM       Socially she is married. Has one child who is living and one child who is deceased. She has 3 grandchildren. There is no tobacco or alcohol use.  ROS General: Negative; No fevers, chills, or night sweats HEENT: Negative; No changes in vision or hearing, sinus congestion, difficulty swallowing Pulmonary: Negative; No cough, wheezing, shortness of breath, hemoptysis Cardiovascular: See  HPI GI: Negative; No nausea, vomiting, diarrhea, or abdominal pain GU: Negative; No dysuria, hematuria, or difficulty voiding Musculoskeletal: Intermittent cramps in her legs and feet. Hematologic/Oncology: Negative; no easy bruising, bleeding Endocrine: Negative; no heat/cold intolerance; no diabetes Neuro: Negative; no changes in balance, headaches Skin: Negative; No rashes or skin lesions Psychiatric: Negative; No behavioral problems, depression Sleep: Negative; No snoring, daytime sleepiness, hypersomnolence, bruxism, restless legs, hypnogognic hallucinations, no cataplexy Other comprehensive 14 point system review is negative.   PE BP 120/65   Pulse (!) 59   Temp 97.9 F (36.6 C)   Ht 5\' 4"  (1.626 m)   Wt 138 lb (62.6 kg)   SpO2 97%   BMI 23.69 kg/m    Repeat blood pressure was 128/70  Wt Readings from Last 3 Encounters:  09/29/19 138 lb (62.6 kg)  05/03/19 143 lb (64.9 kg)  04/06/19 142 lb (64.4 kg)   General: Alert, oriented, no distress.  Skin: normal turgor, no rashes, warm and dry HEENT: Normocephalic, atraumatic. Pupils equal round and reactive to light; sclera anicteric; extraocular muscles intact;  Nose without nasal septal hypertrophy Mouth/Parynx benign; Mallinpatti scale 3 Neck: No JVD, no carotid bruits; normal carotid upstroke Lungs: clear to ausculatation and percussion; no wheezing or  rales Chest wall: without tenderness to palpitation Heart: PMI not displaced, RRR, s1 s2 normal, 2/6 systolic murmur, no diastolic murmur, no rubs, gallops, thrills, or heaves Abdomen: soft, nontender; no hepatosplenomehaly, BS+; abdominal aorta nontender and not dilated by palpation. Back: no CVA tenderness Pulses 2+ Musculoskeletal: full range of motion, normal strength, no joint deformities Extremities: no clubbing cyanosis or edema, Homan's sign negative  Neurologic: grossly nonfocal; Cranial nerves grossly wnl Psychologic: Normal mood and affect  ECG (independently read by me): Sinus bradycardia 59 bpm.  No ST segment changes.  No ectopy.  Normal intervals.  Mild left axis deviation.  December 2019 ECG (independently read by me): Normal sinus rhythm at 68 bpm.  No ectopy.  Normal intervals.  October 2018 ECG (independently read by me): Normal sinus rhythm at 74 bpm.  Left axis deviation.  QTc interval 472 ms.  October 2017 ECG (independently read by me): Sinus bradycardia 56 bpm.  Normal intervals.  No ectopy.  October 2016 ECG (independently read by me): Sinus bradycardia at 55 bpm.  No ectopy.  Normal intervals.  April 2016 ECG (independently read by me): Sinus bradycardia 59 bpm.  No ectopy.  Pior ECG: Sinus rhythm at 53 beats per minute. PR interval 166 ms QTc interval 426 ms.  LABS  BMP Latest Ref Rng & Units 04/09/2019 02/12/2018 07/26/2017  Glucose 65 - 99 mg/dL 114(H) 104(H) 106(H)  BUN 8 - 27 mg/dL 15 15 12   Creatinine 0.57 - 1.00 mg/dL 0.90 0.83 0.83  BUN/Creat Ratio 12 - 28 17 - 14  Sodium 134 - 144 mmol/L 144 141 143  Potassium 3.5 - 5.2 mmol/L 4.9 4.5 4.3  Chloride 96 - 106 mmol/L 106 106 103  CO2 20 - 29 mmol/L 24 29 24   Calcium 8.7 - 10.3 mg/dL 10.0 10.0 9.9      Component Value Date/Time   PROT 7.1 04/09/2019 1026   ALBUMIN 4.7 (H) 04/09/2019 1026   AST 30 04/09/2019 1026   ALT 26 04/09/2019 1026   ALKPHOS 52 04/09/2019 1026   BILITOT 0.5 04/09/2019 1026    BILIDIR 0.1 01/11/2014 1507    CBC Latest Ref Rng & Units 06/30/2018 02/12/2018 07/26/2017  WBC 4.0 - 10.5 K/uL 5.9 6.2 5.0  Hemoglobin 12.0 - 15.0 g/dL 13.6 14.1 14.3  Hematocrit 36.0 - 46.0 % 40.6 42.2 43.0  Platelets 150.0 - 400.0 K/uL 170.0 177.0 182   Lab Results  Component Value Date   MCV 95.7 06/30/2018   MCV 95.1 02/12/2018   MCV 94 07/26/2017   Lab Results  Component Value Date   TSH 2.880 07/26/2017   Lab Results  Component Value Date   HGBA1C 6.4 02/12/2018   Lipid Panel     Component Value Date/Time   CHOL 135 04/09/2019 1026   TRIG 148 04/09/2019 1026   HDL 48 04/09/2019 1026   CHOLHDL 2.8 04/09/2019 1026   CHOLHDL 3 02/12/2018 1054   VLDL 31.6 02/12/2018 1054   LDLCALC 57 04/09/2019 1026     BNP    Component Value Date/Time   PROBNP 86.0 01/10/2011 1357    Lipid Panel     Component Value Date/Time   CHOL 135 04/09/2019 1026   TRIG 148 04/09/2019 1026   HDL 48 04/09/2019 1026   CHOLHDL 2.8 04/09/2019 1026   CHOLHDL 3 02/12/2018 1054   VLDL 31.6 02/12/2018 1054   LDLCALC 57 04/09/2019 1026     RADIOLOGY: No results found.  IMPRESSION: 1. Essential hypertension   2. CAD in native artery   3. CAD S/P percutaneous coronary angioplasty   4. Aortic valve sclerosis   5. Hyperlipidemia with target LDL less than 70     ASSESSMENT AND PLAN: Ms. Blomgren is a young appearing 85 year old Caucasian female who is 27 years status post her initial myocardial infarction which time she underwent PTCA of RCA in 1993.  In 2012 she underwent successful DES stenting of the ramus intermediate vessel and had concomitant 60% LAD stenosis after diagonal vessel and 40-50% mid RCA stenoses/.  Over these many years, she is continued to be free of recurrent anginal symptomatology.  She has been aggressively treated with lipid management and most recent LDL cholesterol is 57 on her combination therapy with rosuvastatin and Zetia.  Her cardiac murmur is consistent with  aortic valve sclerosis.  There is no stenosis noted on her echo study.  Blood pressure today is stable.  She is on metoprolol 12.5 mg daily with sinus rhythm at 59 bpm.  I congratulated her on her continued activity and going to the pool several times per week for exercise.  She also has been caring for her husband.  She will continue her current cardiac regimen.  She will follow-up with Dr. Isaac Bliss and I will see her in 1 year for follow-up Cardiologic evaluation.   Time spent: 25 minutes Troy Sine, MD, [facility]  09/29/2019 3:00 PM

## 2019-09-29 NOTE — Patient Instructions (Signed)
Medication Instructions:  Your physician recommends that you continue on your current medications as directed. Please refer to the Current Medication list given to you today.  *If you need a refill on your cardiac medications before your next appointment, please call your pharmacy*  Lab Work: none If you have labs (blood work) drawn today and your tests are completely normal, you will receive your results only by: Marland Kitchen MyChart Message (if you have MyChart) OR . A paper copy in the mail If you have any lab test that is abnormal or we need to change your treatment, we will call you to review the results.  Testing/Procedures: none  Follow-Up: At [facility], you and your health needs are our priority.  As part of our continuing mission to provide you with exceptional heart care, we have created designated Provider Care Teams.  These Care Teams include your primary Cardiologist (physician) and Advanced Practice Providers (APPs -  Physician Assistants and Nurse Practitioners) who all work together to provide you with the care you need, when you need it.  Your next appointment:   12 month(s)  The format for your next appointment:   Either In Person or Virtual  Provider:   Shelva Majestic, MD

## 2019-10-06 ENCOUNTER — Other Ambulatory Visit: Payer: Self-pay

## 2019-10-06 ENCOUNTER — Ambulatory Visit (INDEPENDENT_AMBULATORY_CARE_PROVIDER_SITE_OTHER): Payer: Medicare Other | Admitting: Podiatry

## 2019-10-06 ENCOUNTER — Encounter: Payer: Self-pay | Admitting: Podiatry

## 2019-10-06 DIAGNOSIS — I2510 Atherosclerotic heart disease of native coronary artery without angina pectoris: Secondary | ICD-10-CM

## 2019-10-06 DIAGNOSIS — Q828 Other specified congenital malformations of skin: Secondary | ICD-10-CM | POA: Diagnosis not present

## 2019-10-06 NOTE — Progress Notes (Signed)
She presents today for follow-up of her painful poor keratomas plantarly.  Objective: Vital signs are stable she is alert and oriented x3 pulses are strongly palpable no open lesions or wounds basically all of her poor keratomas have resolved.  Assessment: Resolution of plantar poor keratomas.  Plan: Follow-up with her on an as-needed basis.

## 2019-12-06 ENCOUNTER — Ambulatory Visit: Payer: Medicare Other | Attending: Internal Medicine

## 2019-12-06 DIAGNOSIS — Z23 Encounter for immunization: Secondary | ICD-10-CM | POA: Insufficient documentation

## 2019-12-06 NOTE — Progress Notes (Signed)
   Covid-19 Vaccination Clinic  Name:  Rachel Nichols    MRN: [id number] DOB: [birth year]  12/06/2019  [name] was observed post Covid-19 immunization for 15 minutes without incidence. She was provided with Vaccine Information Sheet and instruction to access the V-Safe system.   [name] was instructed to call 911 with any severe reactions post vaccine: Marland Kitchen Difficulty breathing  . Swelling of your face and throat  . A fast heartbeat  . A bad rash all over your body  . Dizziness and weakness    Immunizations Administered    Name Date Dose VIS Date Route   Pfizer COVID-19 Vaccine 12/06/2019  4:12 PM 0.3 mL 09/18/2019 Intramuscular   Manufacturer: [place]   Lot: [id number]   [place]: [id number]

## 2020-01-05 ENCOUNTER — Other Ambulatory Visit: Payer: Self-pay

## 2020-01-05 ENCOUNTER — Ambulatory Visit: Payer: Medicare Other | Attending: Internal Medicine

## 2020-01-05 DIAGNOSIS — Z23 Encounter for immunization: Secondary | ICD-10-CM

## 2020-01-05 NOTE — Progress Notes (Signed)
   Covid-19 Vaccination Clinic  Name:  Rachel Nichols    MRN: [id number] DOB: July 07, 1931  01/05/2020  Rachel Nichols was observed post Covid-19 immunization for 15 minutes without incident. She was provided with Vaccine Information Sheet and instruction to access the V-Safe system.   Rachel Nichols was instructed to call 911 with any severe reactions post vaccine: Marland Kitchen Difficulty breathing  . Swelling of face and throat  . A fast heartbeat  . A bad rash all over body  . Dizziness and weakness   Immunizations Administered    Name Date Dose VIS Date Route   Pfizer COVID-19 Vaccine 01/05/2020  4:56 PM 0.3 mL 09/18/2019 Intramuscular   Manufacturer: [place]   Lot: [id number]   [place]: [id number]

## 2020-01-06 ENCOUNTER — Encounter: Payer: Self-pay | Admitting: Internal Medicine

## 2020-01-06 ENCOUNTER — Ambulatory Visit (INDEPENDENT_AMBULATORY_CARE_PROVIDER_SITE_OTHER): Payer: Medicare Other | Admitting: Internal Medicine

## 2020-01-06 VITALS — BP 110/80 | HR 56 | Temp 97.4°F | Ht 64.0 in | Wt 135.6 lb

## 2020-01-06 DIAGNOSIS — Z8711 Personal history of peptic ulcer disease: Secondary | ICD-10-CM | POA: Diagnosis not present

## 2020-01-06 DIAGNOSIS — Z13820 Encounter for screening for osteoporosis: Secondary | ICD-10-CM

## 2020-01-06 DIAGNOSIS — Z0000 Encounter for general adult medical examination without abnormal findings: Secondary | ICD-10-CM | POA: Diagnosis not present

## 2020-01-06 DIAGNOSIS — I10 Essential (primary) hypertension: Secondary | ICD-10-CM | POA: Diagnosis not present

## 2020-01-06 DIAGNOSIS — I2510 Atherosclerotic heart disease of native coronary artery without angina pectoris: Secondary | ICD-10-CM | POA: Diagnosis not present

## 2020-01-06 DIAGNOSIS — E785 Hyperlipidemia, unspecified: Secondary | ICD-10-CM

## 2020-01-06 DIAGNOSIS — Z9861 Coronary angioplasty status: Secondary | ICD-10-CM | POA: Diagnosis not present

## 2020-01-06 DIAGNOSIS — R197 Diarrhea, unspecified: Secondary | ICD-10-CM | POA: Diagnosis not present

## 2020-01-06 DIAGNOSIS — Z780 Asymptomatic menopausal state: Secondary | ICD-10-CM

## 2020-01-06 LAB — LIPID PANEL
Cholesterol: 126 mg/dL (ref 0–200)
HDL: 40 mg/dL (ref >39.00)
LDL Cholesterol: 60 mg/dL (ref 0–99)
NonHDL: 86.13
Total CHOL/HDL Ratio: 3
Triglycerides: 131 mg/dL (ref 0.0–149.0)
VLDL: 26.2 mg/dL (ref 0.0–40.0)

## 2020-01-06 LAB — COMPREHENSIVE METABOLIC PANEL
ALT: 22 U/L (ref 0–35)
AST: 20 U/L (ref 0–37)
Albumin: 4.3 g/dL (ref 3.5–5.2)
Alkaline Phosphatase: 48 U/L (ref 39–117)
BUN: 16 mg/dL (ref 6–23)
CO2: 29 mEq/L (ref 19–32)
Calcium: 9.8 mg/dL (ref 8.4–10.5)
Chloride: 106 mEq/L (ref 96–112)
Creatinine, Ser: 0.84 mg/dL (ref 0.40–1.20)
GFR: 64.19 mL/min (ref >60.00)
Glucose, Bld: 105 mg/dL — ABNORMAL HIGH (ref 70–99)
Potassium: 4.8 mEq/L (ref 3.5–5.1)
Sodium: 140 mEq/L (ref 135–145)
Total Bilirubin: 0.5 mg/dL (ref 0.2–1.2)
Total Protein: 6.8 g/dL (ref 6.0–8.3)

## 2020-01-06 LAB — CBC WITH DIFFERENTIAL/PLATELET
Basophils Absolute: 0 10*3/uL (ref 0.0–0.1)
Basophils Relative: 0.6 % (ref 0.0–3.0)
Eosinophils Absolute: 0.1 10*3/uL (ref 0.0–0.7)
Eosinophils Relative: 1.7 % (ref 0.0–5.0)
HCT: 41.6 % (ref 36.0–46.0)
Hemoglobin: 14 g/dL (ref 12.0–15.0)
Lymphocytes Relative: 30.7 % (ref 12.0–46.0)
Lymphs Abs: 1.7 10*3/uL (ref 0.7–4.0)
MCHC: 33.6 g/dL (ref 30.0–36.0)
MCV: 95.5 fl (ref 78.0–100.0)
Monocytes Absolute: 0.6 10*3/uL (ref 0.1–1.0)
Monocytes Relative: 9.9 % (ref 3.0–12.0)
Neutro Abs: 3.2 10*3/uL (ref 1.4–7.7)
Neutrophils Relative %: 57.1 % (ref 43.0–77.0)
Platelets: 150 10*3/uL (ref 150.0–400.0)
RBC: 4.36 Mil/uL (ref 3.87–5.11)
RDW: 13.4 % (ref 11.5–15.5)
WBC: 5.7 10*3/uL (ref 4.0–10.5)

## 2020-01-06 MED ORDER — SACCHAROMYCES BOULARDII 250 MG PO CAPS: 250.0000 mg | ORAL_CAPSULE | Freq: Two times a day (BID) | ORAL | 1 refills | Status: DC

## 2020-01-06 NOTE — Patient Instructions (Signed)
-Nice seeing you today!!  -Lab work today; will notify you once results are available.  -Remember your tetanus and shingles vaccines 6 weeks after your last COVID vaccine.  -Start florastor 1 tablet twice daily.  -Schedule follow up in 1 year or sooner as needed.   Preventive Care 84 Years and Older, Female Preventive care refers to lifestyle choices and visits with your health care provider that can promote health and wellness. This includes:  A yearly physical exam. This is also called an annual well check.  Regular dental and eye exams.  Immunizations.  Screening for certain conditions.  Healthy lifestyle choices, such as diet and exercise. What can I expect for my preventive care visit? Physical exam Your health care provider will check:  Height and weight. These may be used to calculate body mass index (BMI), which is a measurement that tells if you are at a healthy weight.  Heart rate and blood pressure.  Your skin for abnormal spots. Counseling Your health care provider may ask you questions about:  Alcohol, tobacco, and drug use.  Emotional well-being.  Home and relationship well-being.  Sexual activity.  Eating habits.  History of falls.  Memory and ability to understand (cognition).  Work and work Statistician.  Pregnancy and menstrual history. What immunizations do I need?  Influenza (flu) vaccine  This is recommended every year. Tetanus, diphtheria, and pertussis (Tdap) vaccine  You may need a Td booster every 10 years. Varicella (chickenpox) vaccine  You may need this vaccine if you have not already been vaccinated. Zoster (shingles) vaccine  You may need this after age 84. Pneumococcal conjugate (PCV13) vaccine  One dose is recommended after age 84. Pneumococcal polysaccharide (PPSV23) vaccine  One dose is recommended after age 84. Measles, mumps, and rubella (MMR) vaccine  You may need at least one dose of MMR if you were born in  1957 or later. You may also need a second dose. Meningococcal conjugate (MenACWY) vaccine  You may need this if you have certain conditions. Hepatitis A vaccine  You may need this if you have certain conditions or if you travel or work in places where you may be exposed to hepatitis A. Hepatitis B vaccine  You may need this if you have certain conditions or if you travel or work in places where you may be exposed to hepatitis B. Haemophilus influenzae type b (Hib) vaccine  You may need this if you have certain conditions. You may receive vaccines as individual doses or as more than one vaccine together in one shot (combination vaccines). Talk with your health care provider about the risks and benefits of combination vaccines. What tests do I need? Blood tests  Lipid and cholesterol levels. These may be checked every 5 years, or more frequently depending on your overall health.  Hepatitis C test.  Hepatitis B test. Screening  Lung cancer screening. You may have this screening every year starting at age 90 if you have a 30-pack-year history of smoking and currently smoke or have quit within the past 15 years.  Colorectal cancer screening. All adults should have this screening starting at age 84 and continuing until age 84. Your health care provider may recommend screening at age 76 if you are at increased risk. You will have tests every 1-10 years, depending on your results and the type of screening test.  Diabetes screening. This is done by checking your blood sugar (glucose) after you have not eaten for a while (fasting). You may have  this done every 1-3 years.  Mammogram. This may be done every 1-2 years. Talk with your health care provider about how often you should have regular mammograms.  BRCA-related cancer screening. This may be done if you have a family history of breast, ovarian, tubal, or peritoneal cancers. Other tests  Sexually transmitted disease (STD) testing.  Bone  density scan. This is done to screen for osteoporosis. You may have this done starting at age 84. Follow these instructions at home: Eating and drinking  Eat a diet that includes fresh fruits and vegetables, whole grains, lean protein, and low-fat dairy products. Limit your intake of foods with high amounts of sugar, saturated fats, and salt.  Take vitamin and mineral supplements as recommended by your health care provider.  Do not drink alcohol if your health care provider tells you not to drink.  If you drink alcohol: ? Limit how much you have to 0-1 drink a day. ? Be aware of how much alcohol is in your drink. In the [place] one drink equals one 12 oz bottle of beer (355 mL), one 5 oz glass of wine (148 mL), or one 1 oz glass of hard liquor (44 mL). Lifestyle  Take daily care of your teeth and gums.  Stay active. Exercise for at least 30 minutes on 5 or more days each week.  Do not use any products that contain nicotine or tobacco, such as cigarettes, e-cigarettes, and chewing tobacco. If you need help quitting, ask your health care provider.  If you are sexually active, practice safe sex. Use a condom or other form of protection in order to prevent STIs (sexually transmitted infections).  Talk with your health care provider about taking a low-dose aspirin or statin. What's next?  Go to your health care provider once a year for a well check visit.  Ask your health care provider how often you should have your eyes and teeth checked.  Stay up to date on all vaccines. This information is not intended to replace advice given to you by your health care provider. Make sure you discuss any questions you have with your health care provider. Document Revised: 09/18/2018 Document Reviewed: 09/18/2018 Elsevier Patient Education  2020 [employer].

## 2020-01-06 NOTE — Progress Notes (Signed)
Established Patient Office Visit     This visit occurred during the SARS-CoV-2 public health emergency.  Safety protocols were in place, including screening questions prior to the visit, additional usage of staff PPE, and extensive cleaning of exam room while observing appropriate contact time as indicated for disinfecting solutions.    CC/Reason for Visit: Subsequent Medicare wellness visit and yearly follow-up of chronic medical conditions  HPI: Rachel Nichols is a 86 y.o. female who is coming in today for the above mentioned reasons. Past Medical History is significant for: Coronary artery disease status post MI in 1997 and 2012.  She follows with her cardiologist, Dr. Claiborne Billings on an annual basis.  She is on metoprolol, aspirin and statin.  She also has a history of hyperlipidemia on Crestor and Zetia.  History of peptic ulcer disease/GERD no longer on PPIs.  For the past 6 months she has been complaining of intermittent incontinence of stool.  She wonders what can be done about this.  She has routine eye and dental care.  She has no perceived hearing issues.  She has completed her Covid vaccination series.  She is to swim routinely at the [facility] but not since the pandemic.  She has decided to no longer pursue cancer screening.   Past Medical/Surgical History: Past Medical History:  Diagnosis Date  . Adenomatous colon polyp 2002   Colonoscopy   . Benign neoplasm of colon 2004   Colonoscopy  . CAD (coronary artery disease)    echo 09/13/10- EF>55%, aortic valve is mildly sclerotic; myoview 02-20-12-no ischemia  . Carotid bruit    carotid dopplers 05/16/04- normal  . Diverticulosis 2002,2004,2007   Colonoscopy  . Duodenitis   . Fibrocystic breast disease   . Gastritis   . GERD (gastroesophageal reflux disease)   . Heart attack ([place]) 1993   Cardiolyte stress---  stents 2012  . Hiatal hernia   . History of blood transfusion    pt denies  . Hyperlipidemia   . Peptic ulcer due to  Helicobacter pylori   . PUD (peptic ulcer disease)   . SCC (squamous cell carcinoma) 04/24/2011   right jawline (CX35FU), left forearm (CX35FU)    Past Surgical History:  Procedure Laterality Date  . ANGIOPLASTY  1993  . APPENDECTOMY  1957  . BASAL CELL CARCINOMA EXCISION     From the nose  . CATARACT EXTRACTION  10/26/09   Right eye  . CATARACT EXTRACTION  11/09/09   Left eye  . CHOLECYSTECTOMY  1984  . CORONARY STENT PLACEMENT  01/10/11   hig grade ramus branch stenosis with mod LAD dz, PCI/stenting of ramus branch with a Resolute DES   . DILATION AND CURETTAGE OF UTERUS    . FINGER SURGERY  04/2011   Cyst removal, right hand forefinger  . LEFT OOPHORECTOMY  1957  . SQUAMOUS CELL CARCINOMA EXCISION Right 07/24/2018   Posterior forearm shave & ED&C    Social History:  reports that she quit smoking about 44 years ago. Her smoking use included cigarettes. She has a 30.00 pack-year smoking history. She has never used smokeless tobacco. She reports that she does not drink alcohol or use drugs.  Allergies: Allergies  Allergen Reactions  . Diltiazem Hcl   . Tape Other (See Comments)    It pulls the skin off.  . Tramadol Hcl     Family History:  Family History  Problem Relation Age of Onset  . Heart disease Brother 55  MI  . Heart disease Sister        stents  . Neuropathy Sister   . Hypertension Sister   . Stroke Mother   . Heart disease Father   . Heart attack Father   . Stroke Maternal Grandfather   . Heart attack Paternal Grandmother   . Heart attack Paternal Grandfather   . Breast cancer Maternal Aunt        x 2 aunts  . Irritable bowel syndrome Brother   . Heart attack Brother   . Epilepsy Daughter   . Heart attack Paternal Aunt        x 9  . Colon cancer Neg Hx      Current Outpatient Medications:  .  aspirin 81 MG tablet, Take 81 mg by mouth daily., Disp: , Rfl:  .  cholecalciferol (VITAMIN D) 1000 UNITS tablet, Take 2,000 Units by mouth daily.,  Disp: , Rfl:  .  Coenzyme Q10 (CO Q10) 100 MG CAPS, Take 200 mg by mouth daily., Disp: , Rfl:  .  Cyanocobalamin (VITAMIN B 12 PO), Take 500 mg by mouth daily., Disp: , Rfl:  .  ezetimibe (ZETIA) 10 MG tablet, TAKE 1 TABLET DAILY., Disp: 90 tablet, Rfl: 3 .  Glycerin, Laxative, (GLYCERIN, ADULT,) 2.1 g SUPP, Place 1 suppository rectally 2 (two) times daily as needed (constipation)., Disp: 12 suppository, Rfl: 0 .  magnesium hydroxide (MILK OF MAGNESIA) 400 MG/5ML suspension, Take 5 mLs by mouth daily as needed for mild constipation., Disp: 118 mL, Rfl: 0 .  metoprolol succinate (TOPROL-XL) 25 MG 24 hr tablet, TAKE 1/2 TABLET EVERY DAY, Disp: 45 tablet, Rfl: 3 .  Multiple Vitamins-Minerals (CENTRUM SILVER ULTRA WOMENS) TABS, Take 1 tablet by mouth daily., Disp: , Rfl:  .  Probiotic Product (PROBIOTIC-10 PO), Take by mouth., Disp: , Rfl:  .  rosuvastatin (CRESTOR) 10 MG tablet, TAKE 1 TABLET EVERY DAY, Disp: 90 tablet, Rfl: 3 .  saccharomyces boulardii (FLORASTOR) 250 MG capsule, Take 1 capsule (250 mg total) by mouth 2 (two) times daily., Disp: 180 capsule, Rfl: 1  Current Facility-Administered Medications:  .  triamcinolone acetonide (KENALOG) 10 MG/ML injection 10 mg, 10 mg, Other, Once, Stover, [place], DPM  Review of Systems:  Constitutional: Denies fever, chills, diaphoresis, appetite change and fatigue.  HEENT: Denies photophobia, eye pain, redness, hearing loss, ear pain, congestion, sore throat, rhinorrhea, sneezing, mouth sores, trouble swallowing, neck pain, neck stiffness and tinnitus.   Respiratory: Denies SOB, DOE, cough, chest tightness,  and wheezing.   Cardiovascular: Denies chest pain, palpitations and leg swelling.  Gastrointestinal: Denies nausea, vomiting, abdominal pain,  constipation, blood in stool and abdominal distention.  Genitourinary: Denies dysuria, urgency, frequency, hematuria, flank pain and difficulty urinating.  Endocrine: Denies: hot or cold intolerance,  sweats, changes in hair or nails, polyuria, polydipsia. Musculoskeletal: Denies myalgias, back pain, joint swelling, arthralgias and gait problem.  Skin: Denies pallor, rash and wound.  Neurological: Denies dizziness, seizures, syncope, weakness, light-headedness, numbness and headaches.  Hematological: Denies adenopathy. Easy bruising, personal or family bleeding history  Psychiatric/Behavioral: Denies suicidal ideation, mood changes, confusion, nervousness, sleep disturbance and agitation    Physical Exam: Vitals:   01/06/20 0825  BP: 110/80  Pulse: (!) 56  Temp: (!) 97.4 F (36.3 C)  TempSrc: Temporal  SpO2: 99%  Weight: 135 lb 9.6 oz (61.5 kg)  Height: _0  (1.626 m)    Body mass index is 23.28 kg/m.   Constitutional: NAD, calm, comfortable Eyes: PERRL, lids and  conjunctivae normal ENMT: Mucous membranes are moist.  Tympanic membrane is pearly white, no erythema or bulging. Neck: normal, supple, no masses, no thyromegaly Respiratory: clear to auscultation bilaterally, no wheezing, no crackles. Normal respiratory effort. No accessory muscle use.  Cardiovascular: Regular rate and rhythm, no murmurs / rubs / gallops. No extremity edema. 2+ pedal pulses. No carotid bruits.  Abdomen: no tenderness, no masses palpated. No hepatosplenomegaly. Bowel sounds positive.  Musculoskeletal: no clubbing / cyanosis. No joint deformity upper and lower extremities. Good ROM, no contractures. Normal muscle tone.  Skin: no rashes, lesions, ulcers. No induration Neurologic: CN 2-12 grossly intact. Sensation intact, DTR normal. Strength 5/5 in all 4.  Psychiatric: Normal judgment and insight. Alert and oriented x 3. Normal mood.    Subsequent Medicare wellness visit   1. Risk factors, based on past  M,S,F -vascular disease risk factors include age, history of coronary artery disease, history of hyperlipidemia   2.  Physical activities: Not very physically active since the pandemic, prior to  that used to swim 2-3 times a week at the [facility]   3.  Depression/mood:  Stable, not depressed   4.  Hearing:  No perceived issues   5.  ADL's: Independent in all ADLs   6.  Fall risk:  Low fall risk   7.  Home safety: No problems identified   8.  Height weight, and visual acuity: Height and weight as above, visual acuity is 20/70 on the left, 20/80 on the right and 20/50 with eyes together   9.  Counseling:  Advised follow-up with eye doctor, recommended Tdap and shingles vaccinations at her pharmacy 6 weeks out from her last Covid vaccine   10. Lab orders based on risk factors: Laboratory update will be reviewed   11. Referral :  None today   12. Care plan:  Follow-up in 1 year or sooner as needed   13. Cognitive assessment:  No cognitive impairment   14. Screening: Patient provided with a written and personalized 5-10 year screening schedule in the AVS.   yes   15. Provider List Update:   PCP, cardiology (Dr. Claiborne Billings)  107. Advance Directives: Full code     Office Visit from 01/06/2020 in [place] at [facility]  PHQ-9 Total Score  0      Fall Risk  01/06/2020 05/11/2019 02/12/2018 01/16/2017 12/19/2016  Falls in the past year? 0 1 No No No  Comment - Emmi Telephone Survey: data to providers prior to load - - -  Number falls in past yr: 0 1 - - -  Comment - Emmi Telephone Survey Actual Response = 1 - - -  Injury with Fall? 0 1 - - -  Comment - - - - -     Impression and Plan:  Encounter for preventive health examination -She has routine eye and dental care. -She is due for Tdap and shingles vaccination series which she will get at her pharmacy 6 weeks out from her Covid vaccine.  Screening for osteoporosis -DEXA scan requested  Hyperlipidemia LDL goal <70 -Last LDL was 57 in July 2020. -Continue statin.  Essential hypertension -Well-controlled.  CAD S/P percutaneous coronary angioplasty -Has been doing well, no chest pain, no shortness of breath, no  limitation to her ADLs. -On aspirin, statin, metoprolol. -Follow-up in a yearly basis by cardiology.  PUD, HX OF -Stable, no symptoms, no longer on PPI therapy.  Diarrhea, unspecified type -Suspect either food intolerance or possibly bacterial overgrowth. -Will prescribe Florastor  twice daily, have also asked her to keep a food diary. -Doubt infectious as it has been present for over 6 months and is intermittent.   Patient Instructions  -Nice seeing you today!!  -Lab work today; will notify you once results are available.  -Remember your tetanus and shingles vaccines 6 weeks after your last COVID vaccine.  -Start florastor 1 tablet twice daily.  -Schedule follow up in 1 year or sooner as needed.   Preventive Care 69 Years and Older, Female Preventive care refers to lifestyle choices and visits with your health care provider that can promote health and wellness. This includes:  A yearly physical exam. This is also called an annual well check.  Regular dental and eye exams.  Immunizations.  Screening for certain conditions.  Healthy lifestyle choices, such as diet and exercise. What can I expect for my preventive care visit? Physical exam Your health care provider will check:  Height and weight. These may be used to calculate body mass index (BMI), which is a measurement that tells if you are at a healthy weight.  Heart rate and blood pressure.  Your skin for abnormal spots. Counseling Your health care provider may ask you questions about:  Alcohol, tobacco, and drug use.  Emotional well-being.  Home and relationship well-being.  Sexual activity.  Eating habits.  History of falls.  Memory and ability to understand (cognition).  Work and work Statistician.  Pregnancy and menstrual history. What immunizations do I need?  Influenza (flu) vaccine  This is recommended every year. Tetanus, diphtheria, and pertussis (Tdap) vaccine  You may need a Td  booster every 10 years. Varicella (chickenpox) vaccine  You may need this vaccine if you have not already been vaccinated. Zoster (shingles) vaccine  You may need this after age 63. Pneumococcal conjugate (PCV13) vaccine  One dose is recommended after age 20. Pneumococcal polysaccharide (PPSV23) vaccine  One dose is recommended after age 51. Measles, mumps, and rubella (MMR) vaccine  You may need at least one dose of MMR if you were born in 1957 or later. You may also need a second dose. Meningococcal conjugate (MenACWY) vaccine  You may need this if you have certain conditions. Hepatitis A vaccine  You may need this if you have certain conditions or if you travel or work in places where you may be exposed to hepatitis A. Hepatitis B vaccine  You may need this if you have certain conditions or if you travel or work in places where you may be exposed to hepatitis B. Haemophilus influenzae type b (Hib) vaccine  You may need this if you have certain conditions. You may receive vaccines as individual doses or as more than one vaccine together in one shot (combination vaccines). Talk with your health care provider about the risks and benefits of combination vaccines. What tests do I need? Blood tests  Lipid and cholesterol levels. These may be checked every 5 years, or more frequently depending on your overall health.  Hepatitis C test.  Hepatitis B test. Screening  Lung cancer screening. You may have this screening every year starting at age 12 if you have a 30-pack-year history of smoking and currently smoke or have quit within the past 15 years.  Colorectal cancer screening. All adults should have this screening starting at age 56 and continuing until age 71. Your health care provider may recommend screening at age 10 if you are at increased risk. You will have tests every 1-10 years, depending on your results  and the type of screening test.  Diabetes screening. This is done  by checking your blood sugar (glucose) after you have not eaten for a while (fasting). You may have this done every 1-3 years.  Mammogram. This may be done every 1-2 years. Talk with your health care provider about how often you should have regular mammograms.  BRCA-related cancer screening. This may be done if you have a family history of breast, ovarian, tubal, or peritoneal cancers. Other tests  Sexually transmitted disease (STD) testing.  Bone density scan. This is done to screen for osteoporosis. You may have this done starting at age 3. Follow these instructions at home: Eating and drinking  Eat a diet that includes fresh fruits and vegetables, whole grains, lean protein, and low-fat dairy products. Limit your intake of foods with high amounts of sugar, saturated fats, and salt.  Take vitamin and mineral supplements as recommended by your health care provider.  Do not drink alcohol if your health care provider tells you not to drink.  If you drink alcohol: ? Limit how much you have to 0-1 drink a day. ? Be aware of how much alcohol is in your drink. In the [place] one drink equals one 12 oz bottle of beer (355 mL), one 5 oz glass of wine (148 mL), or one 1 oz glass of hard liquor (44 mL). Lifestyle  Take daily care of your teeth and gums.  Stay active. Exercise for at least 30 minutes on 5 or more days each week.  Do not use any products that contain nicotine or tobacco, such as cigarettes, e-cigarettes, and chewing tobacco. If you need help quitting, ask your health care provider.  If you are sexually active, practice safe sex. Use a condom or other form of protection in order to prevent STIs (sexually transmitted infections).  Talk with your health care provider about taking a low-dose aspirin or statin. What's next?  Go to your health care provider once a year for a well check visit.  Ask your health care provider how often you should have your eyes and teeth  checked.  Stay up to date on all vaccines. This information is not intended to replace advice given to you by your health care provider. Make sure you discuss any questions you have with your health care provider. Document Revised: 09/18/2018 Document Reviewed: 09/18/2018 Elsevier Patient Education  2020 [place], [place] [facility]

## 2020-01-14 DIAGNOSIS — M85851 Other specified disorders of bone density and structure, right thigh: Secondary | ICD-10-CM | POA: Diagnosis not present

## 2020-01-14 DIAGNOSIS — M85852 Other specified disorders of bone density and structure, left thigh: Secondary | ICD-10-CM | POA: Diagnosis not present

## 2020-01-14 LAB — HM DEXA SCAN

## 2020-01-27 ENCOUNTER — Encounter: Payer: Self-pay | Admitting: Internal Medicine

## 2020-04-08 ENCOUNTER — Other Ambulatory Visit: Payer: Self-pay | Admitting: Cardiovascular Disease

## 2020-06-27 DIAGNOSIS — D692 Other nonthrombocytopenic purpura: Secondary | ICD-10-CM | POA: Diagnosis not present

## 2020-06-27 DIAGNOSIS — L718 Other rosacea: Secondary | ICD-10-CM | POA: Diagnosis not present

## 2020-06-27 DIAGNOSIS — L821 Other seborrheic keratosis: Secondary | ICD-10-CM | POA: Diagnosis not present

## 2020-06-27 DIAGNOSIS — Z85828 Personal history of other malignant neoplasm of skin: Secondary | ICD-10-CM | POA: Diagnosis not present

## 2020-07-12 ENCOUNTER — Ambulatory Visit (INDEPENDENT_AMBULATORY_CARE_PROVIDER_SITE_OTHER): Payer: Medicare Other | Admitting: *Deleted

## 2020-07-12 ENCOUNTER — Encounter: Payer: Self-pay | Admitting: Internal Medicine

## 2020-07-12 DIAGNOSIS — Z23 Encounter for immunization: Secondary | ICD-10-CM | POA: Diagnosis not present

## 2020-07-29 ENCOUNTER — Other Ambulatory Visit: Payer: Self-pay | Admitting: *Deleted

## 2020-07-29 DIAGNOSIS — E785 Hyperlipidemia, unspecified: Secondary | ICD-10-CM

## 2020-07-29 MED ORDER — EZETIMIBE 10 MG PO TABS: 10.0000 mg | ORAL_TABLET | Freq: Every day | ORAL | 0 refills | Status: DC

## 2020-07-29 MED ORDER — ROSUVASTATIN CALCIUM 10 MG PO TABS: 10.0000 mg | ORAL_TABLET | Freq: Every day | ORAL | 0 refills | Status: DC

## 2020-07-29 NOTE — Telephone Encounter (Signed)
Rx has been sent to the pharmacy electronically. ° °

## 2020-08-09 ENCOUNTER — Telehealth: Payer: Self-pay | Admitting: Cardiovascular Disease

## 2020-08-09 DIAGNOSIS — E785 Hyperlipidemia, unspecified: Secondary | ICD-10-CM

## 2020-08-09 MED ORDER — ROSUVASTATIN CALCIUM 10 MG PO TABS: 10.0000 mg | ORAL_TABLET | Freq: Every day | ORAL | 0 refills | Status: DC

## 2020-08-09 MED ORDER — EZETIMIBE 10 MG PO TABS: 10.0000 mg | ORAL_TABLET | Freq: Every day | ORAL | 0 refills | Status: DC

## 2020-08-09 NOTE — Telephone Encounter (Signed)
  *  STAT* If patient is at the pharmacy, call can be transferred to refill team.   1. Which medications need to be refilled? (please list name of each medication and dose if known)  rosuvastatin (CRESTOR) 10 MG tablet ezetimibe (ZETIA) 10 MG tablet   2. Which pharmacy/location (including street and city if local pharmacy) is medication to be sent to? [place], [place]  3. Do they need a 30 day or 90 day supply? 10    Pt got a letter from [facility] and it said that they recently got a request to fill the above medications but did not get a response from Dr. Claiborne Billings. She wants to make sure Dr. Claiborne Billings sends new rx for the medications so she doesn't run out

## 2020-08-15 DIAGNOSIS — Z23 Encounter for immunization: Secondary | ICD-10-CM | POA: Diagnosis not present

## 2020-08-21 ENCOUNTER — Encounter (HOSPITAL_COMMUNITY): Payer: Self-pay | Admitting: Emergency Medicine

## 2020-08-21 ENCOUNTER — Other Ambulatory Visit: Payer: Self-pay

## 2020-08-21 ENCOUNTER — Emergency Department (HOSPITAL_COMMUNITY): Payer: Medicare Other

## 2020-08-21 ENCOUNTER — Emergency Department (HOSPITAL_COMMUNITY)
Admission: EM | Admit: 2020-08-21 | Discharge: 2020-08-21 | Disposition: A | Discharge status: Home / Self Care | Payer: Medicare Other | Attending: Emergency Medicine | Admitting: Emergency Medicine

## 2020-08-21 DIAGNOSIS — Z85828 Personal history of other malignant neoplasm of skin: Secondary | ICD-10-CM | POA: Insufficient documentation

## 2020-08-21 DIAGNOSIS — I10 Essential (primary) hypertension: Secondary | ICD-10-CM | POA: Diagnosis not present

## 2020-08-21 DIAGNOSIS — Z85038 Personal history of other malignant neoplasm of large intestine: Secondary | ICD-10-CM | POA: Diagnosis not present

## 2020-08-21 DIAGNOSIS — R059 Cough, unspecified: Secondary | ICD-10-CM | POA: Diagnosis not present

## 2020-08-21 DIAGNOSIS — Z87891 Personal history of nicotine dependence: Secondary | ICD-10-CM | POA: Diagnosis not present

## 2020-08-21 DIAGNOSIS — Z79899 Other long term (current) drug therapy: Secondary | ICD-10-CM | POA: Diagnosis not present

## 2020-08-21 DIAGNOSIS — Z955 Presence of coronary angioplasty implant and graft: Secondary | ICD-10-CM | POA: Insufficient documentation

## 2020-08-21 DIAGNOSIS — J90 Pleural effusion, not elsewhere classified: Secondary | ICD-10-CM | POA: Diagnosis not present

## 2020-08-21 DIAGNOSIS — R490 Dysphonia: Secondary | ICD-10-CM | POA: Diagnosis not present

## 2020-08-21 DIAGNOSIS — I2510 Atherosclerotic heart disease of native coronary artery without angina pectoris: Secondary | ICD-10-CM | POA: Insufficient documentation

## 2020-08-21 DIAGNOSIS — Z7982 Long term (current) use of aspirin: Secondary | ICD-10-CM | POA: Insufficient documentation

## 2020-08-21 MED ORDER — BENZONATATE 100 MG PO CAPS
100.0000 mg | ORAL_CAPSULE | Freq: Once | ORAL | Status: AC
  Administered 2020-08-21: 100 mg via ORAL
  Filled 2020-08-21: qty 1

## 2020-08-21 MED ORDER — BENZONATATE 100 MG PO CAPS: 100.0000 mg | ORAL_CAPSULE | Freq: Three times a day (TID) | ORAL | 0 refills | Status: DC

## 2020-08-21 NOTE — ED Triage Notes (Signed)
Patient reports hoarse voice and occasional productive cough after receiving 3rd dose of Covid vaccination this week .

## 2020-08-21 NOTE — ED Provider Notes (Signed)
[facility] EMERGENCY DEPARTMENT Provider Note   CSN: [id number] Arrival date & time: 08/21/20  0434     History Chief Complaint  Patient presents with   Cough/Hoarse Voice    Rachel Nichols is a 86 y.o. female.  Pt presents to the ED today with cough and a hoarse voice since she received her Covid booster this week.  She took an otc med which has just made her tired.  She is unsure of the name.  She denies any f/c.  No sob.  I think she is mainly here because her husband is here with severe anxiety.         Past Medical History:  Diagnosis Date   Adenomatous colon polyp 2002   Colonoscopy    Benign neoplasm of colon 2004   Colonoscopy   CAD (coronary artery disease)    echo 09/13/10- EF>55%, aortic valve is mildly sclerotic; myoview 02-20-12-no ischemia   Carotid bruit    carotid dopplers 05/16/04- normal   Diverticulosis 2002,2004,2007   Colonoscopy   Duodenitis    Fibrocystic breast disease    Gastritis    GERD (gastroesophageal reflux disease)    Heart attack ([place]) 1993   Cardiolyte stress---  stents 2012   Hiatal hernia    History of blood transfusion    pt denies   Hyperlipidemia    Peptic ulcer due to Helicobacter pylori    PUD (peptic ulcer disease)    SCC (squamous cell carcinoma) 04/24/2011   right jawline (CX35FU), left forearm (CX35FU)    Patient Active Problem List   Diagnosis Date Noted   Hyperlipidemia LDL goal <70 01/13/2015   Murmur 01/13/2015   RUQ abdominal pain 01/22/2014   Pancreatic cyst 01/22/2014   Essential hypertension 03/06/2013   CAD S/P percutaneous coronary angioplasty 04/14/2012   GASTRITIS 10/19/2009   ABDOMINAL PAIN-EPIGASTRIC 10/18/2009   CHOLECYSTECTOMY, HX OF 10/18/2009   DIVERTICULOSIS, COLON 10/14/2009   COLONIC POLYPS, ADENOMATOUS, HX OF 10/14/2009   HIATAL HERNIA WITH REFLUX 09/19/2009   WEIGHT LOSS 09/19/2009   PUD, HX OF 09/19/2009   BACK PAIN 05/27/2008    Past  Surgical History:  Procedure Laterality Date   ANGIOPLASTY  1993   APPENDECTOMY  1957   BASAL CELL CARCINOMA EXCISION     From the nose   CATARACT EXTRACTION  10/26/09   Right eye   CATARACT EXTRACTION  11/09/09   Left eye   CHOLECYSTECTOMY  1984   CORONARY STENT PLACEMENT  01/10/11   hig grade ramus branch stenosis with mod LAD dz, PCI/stenting of ramus branch with a Resolute DES    DILATION AND CURETTAGE OF UTERUS     FINGER SURGERY  04/2011   Cyst removal, right hand forefinger   LEFT OOPHORECTOMY  1957   SQUAMOUS CELL CARCINOMA EXCISION Right 07/24/2018   Posterior forearm shave & ED&C     OB History   No obstetric history on file.     Family History  Problem Relation Age of Onset   Heart disease Brother 10       MI   Heart disease Sister        stents   Neuropathy Sister    Hypertension Sister    Stroke Mother    Heart disease Father    Heart attack Father    Stroke Maternal Grandfather    Heart attack Paternal Grandmother    Heart attack Paternal Grandfather    Breast cancer Maternal Aunt  x 2 aunts   Irritable bowel syndrome Brother    Heart attack Brother    Epilepsy Daughter    Heart attack Paternal Aunt        x 9   Colon cancer Neg Hx     Social History   Tobacco Use   Smoking status: Former Smoker    Packs/day: 2.00    Years: 15.00    Pack years: 30.00    Types: Cigarettes    Quit date: 10/09/1975    Years since quitting: 44.8   Smokeless tobacco: Never Used  Substance Use Topics   Alcohol use: No    Comment: none   Drug use: No    Home Medications Prior to Admission medications   Medication Sig Start Date End Date Taking? Authorizing Provider  aspirin 81 MG tablet Take 81 mg by mouth daily.    [provider]  benzonatate (TESSALON) 100 MG capsule Take 1 capsule (100 mg total) by mouth every 8 (eight) hours. 08/21/20   Isla Pence, MD  cholecalciferol (VITAMIN D) 1000 UNITS tablet Take 2,000  Units by mouth daily.    [provider]  Coenzyme Q10 (CO Q10) 100 MG CAPS Take 200 mg by mouth daily.    [provider]  Cyanocobalamin (VITAMIN B 12 PO) Take 500 mg by mouth daily.    [provider]  ezetimibe (ZETIA) 10 MG tablet Take 1 tablet (10 mg total) by mouth daily. 08/09/20   Troy Sine, MD  Glycerin, Laxative, (GLYCERIN, ADULT,) 2.1 g SUPP Place 1 suppository rectally 2 (two) times daily as needed (constipation). 05/03/19   Wieters, Hallie C, PA-C  magnesium hydroxide (MILK OF MAGNESIA) 400 MG/5ML suspension Take 5 mLs by mouth daily as needed for mild constipation. 05/03/19   Wieters, Hallie C, PA-C  metoprolol succinate (TOPROL-XL) 25 MG 24 hr tablet TAKE 1/2 TABLET EVERY DAY 04/08/20   Troy Sine, MD  Multiple Vitamins-Minerals (CENTRUM SILVER ULTRA WOMENS) TABS Take 1 tablet by mouth daily.    [provider]  Probiotic Product (PROBIOTIC-10 PO) Take by mouth.    [provider]  rosuvastatin (CRESTOR) 10 MG tablet Take 1 tablet (10 mg total) by mouth daily. 08/09/20   Troy Sine, MD  saccharomyces boulardii (FLORASTOR) 250 MG capsule Take 1 capsule (250 mg total) by mouth 2 (two) times daily. 01/06/20   Isaac Bliss, Rayford Halsted, MD    Allergies    Diltiazem hcl, Tape, and Tramadol hcl  Review of Systems   Review of Systems  HENT:       Hoarse voice  Respiratory: Positive for cough.   All other systems reviewed and are negative.   Physical Exam Updated Vital Signs BP 109/81 (BP Location: Right Arm)    Pulse 85    Temp 98.5 F (36.9 C) (Oral)    Resp 20    SpO2 93%   Physical Exam Vitals and nursing note reviewed.  Constitutional:      Appearance: Normal appearance.  HENT:     Head: Normocephalic and atraumatic.     Right Ear: External ear normal.     Left Ear: External ear normal.     Nose: Nose normal.     Mouth/Throat:     Mouth: Mucous membranes are moist.     Pharynx: Oropharynx is clear.  Eyes:      Extraocular Movements: Extraocular movements intact.     Conjunctiva/sclera: Conjunctivae normal.     Pupils: Pupils are  equal, round, and reactive to light.  Cardiovascular:     Rate and Rhythm: Normal rate and regular rhythm.     Pulses: Normal pulses.     Heart sounds: Normal heart sounds.  Pulmonary:     Effort: Pulmonary effort is normal.     Breath sounds: Normal breath sounds.  Abdominal:     General: Abdomen is flat. Bowel sounds are normal.     Palpations: Abdomen is soft.  Musculoskeletal:        General: Normal range of motion.     Cervical back: Normal range of motion and neck supple.  Skin:    General: Skin is warm.     Capillary Refill: Capillary refill takes less than 2 seconds.  Neurological:     General: No focal deficit present.     Mental Status: She is alert and oriented to person, place, and time.  Psychiatric:        Mood and Affect: Mood normal.        Behavior: Behavior normal.     ED Results / Procedures / Treatments   Labs (all labs ordered are listed, but only abnormal results are displayed) Labs Reviewed - No data to display  EKG None  Radiology DG Chest Portable 1 View  Result Date: 08/21/2020 CLINICAL DATA:  Cough. EXAM: PORTABLE CHEST 1 VIEW COMPARISON:  01/10/2011. FINDINGS: 0718 hours. The lungs are clear without focal pneumonia, edema, pneumothorax or pleural effusion. Interstitial markings are diffusely coarsened with chronic features. Cardiopericardial silhouette is at upper limits of normal for size. The visualized bony structures of the thorax show no acute abnormality. IMPRESSION: No active disease. Electronically Signed   By: Misty Stanley M.D.   On: 08/21/2020 08:25    Procedures Procedures (including critical care time)  Medications Ordered in ED Medications  benzonatate (TESSALON) capsule 100 mg (has no administration in time range)    ED Course  I have reviewed the triage vital signs and the nursing notes.  Pertinent  labs & imaging results that were available during my care of the patient were reviewed by me and considered in my medical decision making (see chart for details).    MDM Rules/Calculators/A&P                          Pt will be started on tessalon perles.  She is stable for d/c.  Final Clinical Impression(s) / ED Diagnoses Final diagnoses:  Cough    Rx / DC Orders ED Discharge Orders         Ordered    benzonatate (TESSALON) 100 MG capsule  Every 8 hours        08/21/20 0831           Isla Pence, MD 08/21/20 [phone]

## 2020-08-21 NOTE — ED Notes (Signed)
Pt resting comfortably in a chair. Denies pain at this time. Respirations are even and non-labored, pt does not appear in distress Skin is warm, dry and intact.  States she feels very tired and has a frequent cough Pt is alert and oriented

## 2020-08-24 ENCOUNTER — Other Ambulatory Visit: Payer: Self-pay | Admitting: Cardiovascular Disease

## 2020-08-29 ENCOUNTER — Telehealth (INDEPENDENT_AMBULATORY_CARE_PROVIDER_SITE_OTHER): Payer: Medicare Other | Admitting: Internal Medicine

## 2020-08-29 ENCOUNTER — Encounter: Payer: Self-pay | Admitting: Internal Medicine

## 2020-08-29 ENCOUNTER — Other Ambulatory Visit: Payer: Self-pay

## 2020-08-29 ENCOUNTER — Emergency Department (HOSPITAL_COMMUNITY)
Admission: EM | Admit: 2020-08-29 | Discharge: 2020-08-30 | Disposition: A | Discharge status: Home / Self Care | Payer: Medicare Other | Attending: Emergency Medicine | Admitting: Emergency Medicine

## 2020-08-29 ENCOUNTER — Ambulatory Visit (INDEPENDENT_AMBULATORY_CARE_PROVIDER_SITE_OTHER)
Admission: RE | Admit: 2020-08-29 | Discharge: 2020-08-29 | Disposition: A | Discharge status: Home / Self Care | Payer: Medicare Other | Source: Ambulatory Visit | Attending: Internal Medicine | Admitting: Internal Medicine

## 2020-08-29 ENCOUNTER — Telehealth: Payer: Self-pay

## 2020-08-29 VITALS — BP 118/62 | HR 71

## 2020-08-29 DIAGNOSIS — R54 Age-related physical debility: Secondary | ICD-10-CM

## 2020-08-29 DIAGNOSIS — J181 Lobar pneumonia, unspecified organism: Secondary | ICD-10-CM | POA: Insufficient documentation

## 2020-08-29 DIAGNOSIS — J189 Pneumonia, unspecified organism: Secondary | ICD-10-CM

## 2020-08-29 DIAGNOSIS — I10 Essential (primary) hypertension: Secondary | ICD-10-CM | POA: Insufficient documentation

## 2020-08-29 DIAGNOSIS — R058 Other specified cough: Secondary | ICD-10-CM

## 2020-08-29 DIAGNOSIS — Z87891 Personal history of nicotine dependence: Secondary | ICD-10-CM | POA: Insufficient documentation

## 2020-08-29 DIAGNOSIS — Z20822 Contact with and (suspected) exposure to covid-19: Secondary | ICD-10-CM | POA: Insufficient documentation

## 2020-08-29 DIAGNOSIS — R531 Weakness: Secondary | ICD-10-CM

## 2020-08-29 DIAGNOSIS — Z7982 Long term (current) use of aspirin: Secondary | ICD-10-CM | POA: Insufficient documentation

## 2020-08-29 DIAGNOSIS — Z85038 Personal history of other malignant neoplasm of large intestine: Secondary | ICD-10-CM | POA: Diagnosis not present

## 2020-08-29 DIAGNOSIS — R059 Cough, unspecified: Secondary | ICD-10-CM | POA: Diagnosis not present

## 2020-08-29 DIAGNOSIS — I2510 Atherosclerotic heart disease of native coronary artery without angina pectoris: Secondary | ICD-10-CM | POA: Diagnosis not present

## 2020-08-29 DIAGNOSIS — Z79899 Other long term (current) drug therapy: Secondary | ICD-10-CM | POA: Insufficient documentation

## 2020-08-29 DIAGNOSIS — R0602 Shortness of breath: Secondary | ICD-10-CM | POA: Diagnosis not present

## 2020-08-29 DIAGNOSIS — Z9861 Coronary angioplasty status: Secondary | ICD-10-CM

## 2020-08-29 LAB — COMPREHENSIVE METABOLIC PANEL
ALT: 144 U/L — ABNORMAL HIGH (ref 0–44)
AST: 142 U/L — ABNORMAL HIGH (ref 15–41)
Albumin: 2.6 g/dL — ABNORMAL LOW (ref 3.5–5.0)
Alkaline Phosphatase: 123 U/L (ref 38–126)
Anion gap: 12 (ref 5–15)
BUN: 15 mg/dL (ref 8–23)
CO2: 21 mmol/L — ABNORMAL LOW (ref 22–32)
Calcium: 9.7 mg/dL (ref 8.9–10.3)
Chloride: 99 mmol/L (ref 98–111)
Creatinine, Ser: 0.93 mg/dL (ref 0.44–1.00)
GFR, Estimated: 59 mL/min — ABNORMAL LOW (ref >60)
Glucose, Bld: 126 mg/dL — ABNORMAL HIGH (ref 70–99)
Potassium: 3.7 mmol/L (ref 3.5–5.1)
Sodium: 132 mmol/L — ABNORMAL LOW (ref 135–145)
Total Bilirubin: 0.9 mg/dL (ref 0.3–1.2)
Total Protein: 7 g/dL (ref 6.5–8.1)

## 2020-08-29 LAB — CBC WITH DIFFERENTIAL/PLATELET
Abs Immature Granulocytes: 0.2 10*3/uL — ABNORMAL HIGH (ref 0.00–0.07)
Basophils Absolute: 0 10*3/uL (ref 0.0–0.1)
Basophils Relative: 0 %
Eosinophils Absolute: 0 10*3/uL (ref 0.0–0.5)
Eosinophils Relative: 0 %
HCT: 38.4 % (ref 36.0–46.0)
Hemoglobin: 12.5 g/dL (ref 12.0–15.0)
Immature Granulocytes: 2 %
Lymphocytes Relative: 11 %
Lymphs Abs: 1.4 10*3/uL (ref 0.7–4.0)
MCH: 31 pg (ref 26.0–34.0)
MCHC: 32.6 g/dL (ref 30.0–36.0)
MCV: 95.3 fL (ref 80.0–100.0)
Monocytes Absolute: 0.8 10*3/uL (ref 0.1–1.0)
Monocytes Relative: 6 %
Neutro Abs: 10.5 10*3/uL — ABNORMAL HIGH (ref 1.7–7.7)
Neutrophils Relative %: 81 %
Platelets: 301 10*3/uL (ref 150–400)
RBC: 4.03 MIL/uL (ref 3.87–5.11)
RDW: 13.1 % (ref 11.5–15.5)
WBC: 13 10*3/uL — ABNORMAL HIGH (ref 4.0–10.5)
nRBC: 0 % (ref 0.0–0.2)

## 2020-08-29 NOTE — Progress Notes (Signed)
Virtual Visit via Video Note  I connected with@ on 08/29/20 at  2:30 PM EST by a video enabled telemedicine application and verified that I am speaking with the correct person using two identifiers. Location patient: home Location provider:work  office Persons participating in the virtual visit: patient, provider Camera was blocked and after  10 minutes attenpts  Converted to a phone  Visit   [place] national recommendations  regarding COVID 19 pandemic   video visit is advised over in office visit for this patient.  Patient aware  of the limitations of evaluation and management by telemedicine and  availability of in person appointments. and agreed to proceed.   HPI: Rachel Nichols presents for video visit  PCP appt NA technical difficulties finished on the telephone camera was not working on her side. Seen ed for cough 11 14 and  Felt sx rx    Has had covid booster recently  States that her cough is ongoing might be getting worse she is feeling very weak little shortness of breath but no hemoptysis.  Taking a lot of Pedialyte and fluids to maintain last time she felt normal energy was about 2 weeks ago. Currently she is not taking really alcohol medicine so not causing the side effect.  She does not think she has a fever pulse is in the 70s and blood pressure in the teens.  No falling.  No hemoptysis   She has not been tested for Covid when she went to the ED her husband has problem with anxiety. ROS: See pertinent positives and negatives per HPI.  Past Medical History:  Diagnosis Date  . Adenomatous colon polyp 2002   Colonoscopy   . Benign neoplasm of colon 2004   Colonoscopy  . CAD (coronary artery disease)    echo 09/13/10- EF>55%, aortic valve is mildly sclerotic; myoview 02-20-12-no ischemia  . Carotid bruit    carotid dopplers 05/16/04- normal  . Diverticulosis 2002,2004,2007   Colonoscopy  . Duodenitis   . Fibrocystic breast disease   . Gastritis   . GERD (gastroesophageal  reflux disease)   . Heart attack ([place]) 1993   Cardiolyte stress---  stents 2012  . Hiatal hernia   . History of blood transfusion    pt denies  . Hyperlipidemia   . Peptic ulcer due to Helicobacter pylori   . PUD (peptic ulcer disease)   . SCC (squamous cell carcinoma) 04/24/2011   right jawline (CX35FU), left forearm (CX35FU)    Past Surgical History:  Procedure Laterality Date  . ANGIOPLASTY  1993  . APPENDECTOMY  1957  . BASAL CELL CARCINOMA EXCISION     From the nose  . CATARACT EXTRACTION  10/26/09   Right eye  . CATARACT EXTRACTION  11/09/09   Left eye  . CHOLECYSTECTOMY  1984  . CORONARY STENT PLACEMENT  01/10/11   hig grade ramus branch stenosis with mod LAD dz, PCI/stenting of ramus branch with a Resolute DES   . DILATION AND CURETTAGE OF UTERUS    . FINGER SURGERY  04/2011   Cyst removal, right Nichols forefinger  . LEFT OOPHORECTOMY  1957  . SQUAMOUS CELL CARCINOMA EXCISION Right 07/24/2018   Posterior forearm shave & ED&C    Family History  Problem Relation Age of Onset  . Heart disease Brother 1       MI  . Heart disease Sister        stents  . Neuropathy Sister   . Hypertension Sister   .  Stroke Mother   . Heart disease Father   . Heart attack Father   . Stroke Maternal Grandfather   . Heart attack Paternal Grandmother   . Heart attack Paternal Grandfather   . Breast cancer Maternal Aunt        x 2 aunts  . Irritable bowel syndrome Brother   . Heart attack Brother   . Epilepsy Daughter   . Heart attack Paternal Aunt        x 9  . Colon cancer Neg Hx     Social History   Tobacco Use  . Smoking status: Former Smoker    Packs/day: 2.00    Years: 15.00    Pack years: 30.00    Types: Cigarettes    Quit date: 10/09/1975    Years since quitting: 44.9  . Smokeless tobacco: Never Used  Substance Use Topics  . Alcohol use: No    Comment: none  . Drug use: No      Current Outpatient Medications:  .  aspirin 81 MG tablet, Take 81 mg by mouth  daily., Disp: , Rfl:  .  cholecalciferol (VITAMIN D) 1000 UNITS tablet, Take 2,000 Units by mouth daily., Disp: , Rfl:  .  Coenzyme Q10 (CO Q10) 100 MG CAPS, Take 200 mg by mouth daily., Disp: , Rfl:  .  Cyanocobalamin (VITAMIN B 12 PO), Take 500 mg by mouth daily., Disp: , Rfl:  .  ezetimibe (ZETIA) 10 MG tablet, Take 1 tablet (10 mg total) by mouth daily., Disp: 90 tablet, Rfl: 0 .  Glycerin, Laxative, (GLYCERIN, ADULT,) 2.1 g SUPP, Place 1 suppository rectally 2 (two) times daily as needed (constipation)., Disp: 12 suppository, Rfl: 0 .  magnesium hydroxide (MILK OF MAGNESIA) 400 MG/5ML suspension, Take 5 mLs by mouth daily as needed for mild constipation., Disp: 118 mL, Rfl: 0 .  metoprolol succinate (TOPROL-XL) 25 MG 24 hr tablet, TAKE 1/2 TABLET EVERY DAY, Disp: 45 tablet, Rfl: 1 .  Multiple Vitamins-Minerals (CENTRUM SILVER ULTRA WOMENS) TABS, Take 1 tablet by mouth daily., Disp: , Rfl:  .  Probiotic Product (PROBIOTIC-10 PO), Take by mouth., Disp: , Rfl:  .  rosuvastatin (CRESTOR) 10 MG tablet, Take 1 tablet (10 mg total) by mouth daily., Disp: 90 tablet, Rfl: 0 .  saccharomyces boulardii (FLORASTOR) 250 MG capsule, Take 1 capsule (250 mg total) by mouth 2 (two) times daily., Disp: 180 capsule, Rfl: 1 .  benzonatate (TESSALON) 100 MG capsule, Take 1 capsule (100 mg total) by mouth every 8 (eight) hours. (Patient not taking: Reported on 08/29/2020), Disp: 21 capsule, Rfl: 0  Current Facility-Administered Medications:  .  triamcinolone acetonide (KENALOG) 10 MG/ML injection 10 mg, 10 mg, Other, Once, [place], [place], DPM  EXAM: BP Readings from Last 3 Encounters:  08/29/20 118/62  08/21/20 113/60  01/06/20 110/80    VITALS per patient if applicable:  GENERAL: alert, oriented, her voice sounds  Adequate mild haorseness   But tired and no acute dyspnea.  Cannot visualize but had a conversation  HEENT: NECK: normal movements of the head and neck No obvious gasping or wheezing cannot  assess color.  CV: reports normal pulse   PSYCH/NEURO:  speech and thought processing grossly intact Lab Results  Component Value Date   WBC 5.7 01/06/2020   HGB 14.0 01/06/2020   HCT 41.6 01/06/2020   PLT 150.0 01/06/2020   GLUCOSE 105 (H) 01/06/2020   CHOL 126 01/06/2020   TRIG 131.0 01/06/2020   HDL 40.00 01/06/2020  LDLCALC 60 01/06/2020   ALT 22 01/06/2020   AST 20 01/06/2020   NA 140 01/06/2020   K 4.8 01/06/2020   CL 106 01/06/2020   CREATININE 0.84 01/06/2020   BUN 16 01/06/2020   CO2 29 01/06/2020   TSH 2.880 07/26/2017   INR 0.98 01/10/2011   HGBA1C 6.4 02/12/2018    ASSESSMENT AND PLAN:  Discussed the following assessment and plan:    ICD-10-CM   1. Respiratory tract congestion with cough  R05.8 DG Chest 2 View    CBC with Differential/Platelet    BASIC METABOLIC PANEL WITH GFR  2. Weakness  R53.1 DG Chest 2 View    CBC with Differential/Platelet    BASIC METABOLIC PANEL WITH GFR  3. Advanced age  R6   doesn't sound dyspnea on phone   And cognitition intact  .  Has had vaccine and booster   But get tested any way   Extent of weakness  Get cxray and lab today  And then plan  Fu antibiotic etc .  Counseled.   Expectant management and discussion of plan and treatment with opportunity to ask questions and all were answered. The patient agreed with the plan and demonstrated an understanding of the instructions.   Advised to call back or seek an in-person evaluation if worsening  or having  further concerns . Return for depending on results.    Shanon Ace, MD   Call from radiology at around 445 shows opacity suspicious left lower lobe pneumonia and she has an old compression fracture. Blood work is not back yet. I suggest she seek emergency care because of the diagnosis her age and high risk. I think she would benefit from injection Rocephin or similar.  To get started.  And check her oxygenation. Would also advise getting a Covid test even though she  is immunized.

## 2020-08-29 NOTE — Progress Notes (Signed)
Contact patient and  tell her she has  pneumonia on the left side  I dont have blood work available yet .   I want  her to to go   to the ed /hospital for immediate eval and treatment    may require  shot of antibiotic to get  medicine started .  We can send in an oral antibiotic but  may not be as strong.

## 2020-08-29 NOTE — ED Triage Notes (Signed)
Patient concern for pneumonia. Reported was seen at PCP today and XRAY was done.

## 2020-08-29 NOTE — Telephone Encounter (Signed)
Received report from [employer] from [facility] imaging, Patient has left Basilar airspace opacities, concerning for pneumonia.  Lower thoracic compression fracture age indeterminate

## 2020-08-30 DIAGNOSIS — J181 Lobar pneumonia, unspecified organism: Secondary | ICD-10-CM | POA: Diagnosis not present

## 2020-08-30 LAB — RESPIRATORY PANEL BY RT PCR (FLU A&B, COVID)
Influenza A by PCR: NEGATIVE
Influenza B by PCR: NEGATIVE
SARS Coronavirus 2 by RT PCR: NEGATIVE

## 2020-08-30 MED ORDER — LEVOFLOXACIN 500 MG PO TABS
500.0000 mg | ORAL_TABLET | Freq: Once | ORAL | Status: AC
  Administered 2020-08-30: 500 mg via ORAL
  Filled 2020-08-30: qty 1

## 2020-08-30 MED ORDER — LEVOFLOXACIN 500 MG PO TABS: 500.0000 mg | ORAL_TABLET | Freq: Every day | ORAL | 0 refills | Status: AC

## 2020-08-30 NOTE — Telephone Encounter (Signed)
See messages  Seen in ed   Given levaquin  And to have fu  pcp

## 2020-08-30 NOTE — ED Provider Notes (Signed)
[facility] EMERGENCY DEPARTMENT Provider Note   CSN: [id number] Arrival date & time: 08/29/20  1750     History Chief Complaint  Patient presents with  . Cough    Rachel Nichols is a 86 y.o. female.  Patient with approximately 4 days of worsening productive cough.  She went to her PCP today where an x-ray was done concerning for left lower lobe pneumonia.  Was sent to the emergency room for emergent treatment of this pneumonia.  Patient states she developed a short of breath but not too bad.  No fevers.  Generalized weakness but no focal weakness.  No nausea, vomiting, diarrhea.  She is had both Covid vaccines and the booster.  No sick contacts.  She is here with her daughter.  It does appear that she was here about a week ago with just a cough but was stable at that time.        Past Medical History:  Diagnosis Date  . Adenomatous colon polyp 2002   Colonoscopy   . Benign neoplasm of colon 2004   Colonoscopy  . CAD (coronary artery disease)    echo 09/13/10- EF>55%, aortic valve is mildly sclerotic; myoview 02-20-12-no ischemia  . Carotid bruit    carotid dopplers 05/16/04- normal  . Diverticulosis 2002,2004,2007   Colonoscopy  . Duodenitis   . Fibrocystic breast disease   . Gastritis   . GERD (gastroesophageal reflux disease)   . Heart attack ([place]) 1993   Cardiolyte stress---  stents 2012  . Hiatal hernia   . History of blood transfusion    pt denies  . Hyperlipidemia   . Peptic ulcer due to Helicobacter pylori   . PUD (peptic ulcer disease)   . SCC (squamous cell carcinoma) 04/24/2011   right jawline (CX35FU), left forearm (CX35FU)    Patient Active Problem List   Diagnosis Date Noted  . Hyperlipidemia LDL goal <70 01/13/2015  . Murmur 01/13/2015  . RUQ abdominal pain 01/22/2014  . Pancreatic cyst 01/22/2014  . Essential hypertension 03/06/2013  . CAD S/P percutaneous coronary angioplasty 04/14/2012  . GASTRITIS 10/19/2009  . ABDOMINAL  PAIN-EPIGASTRIC 10/18/2009  . CHOLECYSTECTOMY, HX OF 10/18/2009  . DIVERTICULOSIS, COLON 10/14/2009  . COLONIC POLYPS, ADENOMATOUS, HX OF 10/14/2009  . HIATAL HERNIA WITH REFLUX 09/19/2009  . WEIGHT LOSS 09/19/2009  . PUD, HX OF 09/19/2009  . BACK PAIN 05/27/2008    Past Surgical History:  Procedure Laterality Date  . ANGIOPLASTY  1993  . APPENDECTOMY  1957  . BASAL CELL CARCINOMA EXCISION     From the nose  . CATARACT EXTRACTION  10/26/09   Right eye  . CATARACT EXTRACTION  11/09/09   Left eye  . CHOLECYSTECTOMY  1984  . CORONARY STENT PLACEMENT  01/10/11   hig grade ramus branch stenosis with mod LAD dz, PCI/stenting of ramus branch with a Resolute DES   . DILATION AND CURETTAGE OF UTERUS    . FINGER SURGERY  04/2011   Cyst removal, right hand forefinger  . LEFT OOPHORECTOMY  1957  . SQUAMOUS CELL CARCINOMA EXCISION Right 07/24/2018   Posterior forearm shave & ED&C     OB History   No obstetric history on file.     Family History  Problem Relation Age of Onset  . Heart disease Brother 20       MI  . Heart disease Sister        stents  . Neuropathy Sister   . Hypertension Sister   .  Stroke Mother   . Heart disease Father   . Heart attack Father   . Stroke Maternal Grandfather   . Heart attack Paternal Grandmother   . Heart attack Paternal Grandfather   . Breast cancer Maternal Aunt        x 2 aunts  . Irritable bowel syndrome Brother   . Heart attack Brother   . Epilepsy Daughter   . Heart attack Paternal Aunt        x 9  . Colon cancer Neg Hx     Social History   Tobacco Use  . Smoking status: Former Smoker    Packs/day: 2.00    Years: 15.00    Pack years: 30.00    Types: Cigarettes    Quit date: 10/09/1975    Years since quitting: 44.9  . Smokeless tobacco: Never Used  Substance Use Topics  . Alcohol use: No    Comment: none  . Drug use: No    Home Medications Prior to Admission medications   Medication Sig Start Date End Date Taking?  Authorizing Provider  aspirin 81 MG tablet Take 81 mg by mouth daily.    [provider]  cholecalciferol (VITAMIN D) 1000 UNITS tablet Take 2,000 Units by mouth daily.    [provider]  Coenzyme Q10 (CO Q10) 100 MG CAPS Take 200 mg by mouth daily.    [provider]  Cyanocobalamin (VITAMIN B 12 PO) Take 500 mg by mouth daily.    [provider]  ezetimibe (ZETIA) 10 MG tablet Take 1 tablet (10 mg total) by mouth daily. 08/09/20   Troy Sine, MD  Glycerin, Laxative, (GLYCERIN, ADULT,) 2.1 g SUPP Place 1 suppository rectally 2 (two) times daily as needed (constipation). 05/03/19   Wieters, Hallie C, PA-C  levofloxacin (LEVAQUIN) 500 MG tablet Take 1 tablet (500 mg total) by mouth daily for 7 days. X 7 days 08/30/20 09/06/20  Deserae Jennings, Corene Cornea, MD  magnesium hydroxide (MILK OF MAGNESIA) 400 MG/5ML suspension Take 5 mLs by mouth daily as needed for mild constipation. 05/03/19   Wieters, Hallie C, PA-C  metoprolol succinate (TOPROL-XL) 25 MG 24 hr tablet TAKE 1/2 TABLET EVERY DAY 08/24/20   Troy Sine, MD  Multiple Vitamins-Minerals (CENTRUM SILVER ULTRA WOMENS) TABS Take 1 tablet by mouth daily.    [provider]  Probiotic Product (PROBIOTIC-10 PO) Take by mouth.    [provider]  rosuvastatin (CRESTOR) 10 MG tablet Take 1 tablet (10 mg total) by mouth daily. 08/09/20   Troy Sine, MD  saccharomyces boulardii (FLORASTOR) 250 MG capsule Take 1 capsule (250 mg total) by mouth 2 (two) times daily. 01/06/20   Isaac Bliss, Rayford Halsted, MD    Allergies    Diltiazem hcl, Tape, and Tramadol hcl  Review of Systems   Review of Systems  All other systems reviewed and are negative.   Physical Exam Updated Vital Signs BP (!) 145/98 (BP Location: Right Arm)   Pulse 81   Temp 99.5 F (37.5 C) (Oral)   Resp 16   SpO2 93%   Physical Exam Vitals and nursing note reviewed.  Constitutional:      Appearance: She is well-developed.    HENT:     Head: Normocephalic and atraumatic.     Nose: Nose normal. No congestion or rhinorrhea.     Mouth/Throat:     Mouth: Mucous membranes are moist.     Pharynx: Oropharynx is clear.  Eyes:  Pupils: Pupils are equal, round, and reactive to light.  Cardiovascular:     Rate and Rhythm: Normal rate and regular rhythm.  Pulmonary:     Effort: No respiratory distress.     Breath sounds: No stridor. Rales (left lower lobe) present.  Abdominal:     General: There is no distension.  Musculoskeletal:        General: Normal range of motion.     Cervical back: Normal range of motion.  Skin:    General: Skin is warm.  Neurological:     General: No focal deficit present.     Mental Status: She is alert.  Psychiatric:        Mood and Affect: Mood normal.     ED Results / Procedures / Treatments   Labs (all labs ordered are listed, but only abnormal results are displayed) Labs Reviewed  CBC WITH DIFFERENTIAL/PLATELET - Abnormal; Notable for the following components:      Result Value   WBC 13.0 (*)    Neutro Abs 10.5 (*)    Abs Immature Granulocytes 0.20 (*)    All other components within normal limits  COMPREHENSIVE METABOLIC PANEL - Abnormal; Notable for the following components:   Sodium 132 (*)    CO2 21 (*)    Glucose, Bld 126 (*)    Albumin 2.6 (*)    AST 142 (*)    ALT 144 (*)    GFR, Estimated 59 (*)    All other components within normal limits  RESPIRATORY PANEL BY RT PCR (FLU A&B, COVID)    EKG EKG Interpretation  Date/Time:  Tuesday August 30 2020 01:28:26 EST Ventricular Rate:  78 PR Interval:    QRS Duration: 87 QT Interval:  418 QTC Calculation: 477 R Axis:   -33 Text Interpretation: Sinus rhythm Left axis deviation No acute changes Confirmed by Merrily Pew [phone]) on 08/30/2020 2:48:04 AM   Radiology DG Chest 2 View  Result Date: 08/29/2020 CLINICAL DATA:  Cough.  Shortness of breath. EXAM: CHEST - 2 VIEW COMPARISON:  None. FINDINGS:  The heart size and mediastinal contours are within normal limits. Left basilar airspace opacities. No visible pleural effusions or pneumothorax. Thoracic degenerative change. Age indeterminate lower thoracic vertebral body compression fracture with approximately 30% height loss IMPRESSION: 1. Left basilar airspace opacities, concerning for pneumonia. 2. Age indeterminate lower thoracic vertebral body compression fracture. Recommend correlation with the presence or absence of back pain and recent trauma. These results will be called to the ordering clinician or representative by the Radiologist Assistant, and communication documented in the PACS or [employer]. Electronically Signed   By: Margaretha Sheffield MD   On: 08/29/2020 15:58    Procedures Procedures (including critical care time)  Medications Ordered in ED Medications  levofloxacin (LEVAQUIN) tablet 500 mg (500 mg Oral Given 08/30/20 0319)    ED Course  I have reviewed the triage vital signs and the nursing notes.  Pertinent labs & imaging results that were available during my care of the patient were reviewed by me and considered in my medical decision making (see chart for details).    MDM Rules/Calculators/A&P                         Overall patient appears well.  She is intermittently slightly tachypneic but not significantly hypoxic, tachycardic or febrile.  No concern for sepsis or severe sepsis at this time.  We will going check a Covid test  and EKG to make sure that her QT is okay and then start antibiotics.  We will then ambulate her on pulse ox to make sure she does not get significant hypoxic or tolerating thing down to about 89 or 88%.  If there is any abnormalities will admit her if not we will discharge to follow-up with PCP closely. Did well with ambulation aside from mild tachypnea. Stable in ED. covid negative. ECG ok, discussed the side effects of levaquin but why I thought it was the most appropriate in this  situation. Understood. Also discussed the relative risk of discharge vs admission and we agree (after shared decision making) that discharge would be appropriate.   Stable for discharge with strict return precautions.  Final Clinical Impression(s) / ED Diagnoses Final diagnoses:  Community acquired pneumonia of left lower lobe of lung    Rx / DC Orders ED Discharge Orders         Ordered    levofloxacin (LEVAQUIN) 500 MG tablet  Daily        08/30/20 0310           Elverna Caffee, Corene Cornea, MD 08/30/20 [phone]

## 2020-08-30 NOTE — ED Notes (Signed)
Patient ambulated around nurses station. HR remained in the 80s bpm and oxygen sats 92-95% on room air. Patient denied difficulty.

## 2020-09-06 ENCOUNTER — Telehealth (INDEPENDENT_AMBULATORY_CARE_PROVIDER_SITE_OTHER): Payer: Medicare Other | Admitting: Internal Medicine

## 2020-09-06 ENCOUNTER — Other Ambulatory Visit: Payer: Self-pay

## 2020-09-06 DIAGNOSIS — Z09 Encounter for follow-up examination after completed treatment for conditions other than malignant neoplasm: Secondary | ICD-10-CM | POA: Diagnosis not present

## 2020-09-06 DIAGNOSIS — J189 Pneumonia, unspecified organism: Secondary | ICD-10-CM

## 2020-09-06 NOTE — Progress Notes (Signed)
Virtual Visit via Telephone Note  I connected with Rachel Nichols on 09/06/20 at  4:00 PM EST by telephone and verified that I am speaking with the correct person using two identifiers.   I discussed the limitations, risks, security and privacy concerns of performing an evaluation and management service by telephone and the availability of in person appointments. I also discussed with the patient that there may be a patient responsible charge related to this service. The patient expressed understanding and agreed to proceed.  Location patient: home Location provider: work office Participants present for the call: patient, provider Patient did not have a visit in the prior 7 days to address this/these issue(s).   History of Present Illness:  She has scheduled this visit as an ED follow-up.  She was seen in our office by another provider on 11/22 due to complaints of coughing and mild dyspnea on exertion.  Chest x-ray showed left lower lobe airspace disease and she was sent to the ED for evaluation.  Her Covid test resulted negative.  She was started on levofloxacin for 7 days.  She is currently on day #5.  She has not had any fever, she is taking the antibiotic as prescribed.  She has 2 days remaining.  The coughing has improved, the shortness of breath has improved but she still feels very weak and tired.   Observations/Objective: Patient sounds cheerful and well on the phone. I do not appreciate any increased work of breathing. Speech and thought processing are grossly intact. Patient reported vitals: None reported   Current Outpatient Medications:  .  aspirin 81 MG tablet, Take 81 mg by mouth daily., Disp: , Rfl:  .  cholecalciferol (VITAMIN D) 1000 UNITS tablet, Take 2,000 Units by mouth daily., Disp: , Rfl:  .  Coenzyme Q10 (CO Q10) 100 MG CAPS, Take 200 mg by mouth daily., Disp: , Rfl:  .  Cyanocobalamin (VITAMIN B 12 PO), Take 500 mg by mouth daily., Disp: , Rfl:  .  ezetimibe  (ZETIA) 10 MG tablet, Take 1 tablet (10 mg total) by mouth daily., Disp: 90 tablet, Rfl: 0 .  Glycerin, Laxative, (GLYCERIN, ADULT,) 2.1 g SUPP, Place 1 suppository rectally 2 (two) times daily as needed (constipation)., Disp: 12 suppository, Rfl: 0 .  levofloxacin (LEVAQUIN) 500 MG tablet, Take 1 tablet (500 mg total) by mouth daily for 7 days. X 7 days, Disp: 7 tablet, Rfl: 0 .  magnesium hydroxide (MILK OF MAGNESIA) 400 MG/5ML suspension, Take 5 mLs by mouth daily as needed for mild constipation., Disp: 118 mL, Rfl: 0 .  metoprolol succinate (TOPROL-XL) 25 MG 24 hr tablet, TAKE 1/2 TABLET EVERY DAY, Disp: 45 tablet, Rfl: 1 .  Multiple Vitamins-Minerals (CENTRUM SILVER ULTRA WOMENS) TABS, Take 1 tablet by mouth daily., Disp: , Rfl:  .  Probiotic Product (PROBIOTIC-10 PO), Take by mouth., Disp: , Rfl:  .  rosuvastatin (CRESTOR) 10 MG tablet, Take 1 tablet (10 mg total) by mouth daily., Disp: 90 tablet, Rfl: 0 .  saccharomyces boulardii (FLORASTOR) 250 MG capsule, Take 1 capsule (250 mg total) by mouth 2 (two) times daily., Disp: 180 capsule, Rfl: 1  Current Facility-Administered Medications:  .  triamcinolone acetonide (KENALOG) 10 MG/ML injection 10 mg, 10 mg, Other, Once, Cannon Kettle, [place], DPM  Review of Systems:  Constitutional: Denies fever, chills, diaphoresis, appetite change. HEENT: Denies photophobia, eye pain, redness, hearing loss, ear pain, congestion, sore throat, rhinorrhea, sneezing, mouth sores, trouble swallowing, neck pain, neck stiffness and  tinnitus.   Respiratory: Denies  chest tightness,  and wheezing.   Cardiovascular: Denies chest pain, palpitations and leg swelling.  Gastrointestinal: Denies nausea, vomiting, abdominal pain, diarrhea, constipation, blood in stool and abdominal distention.  Genitourinary: Denies dysuria, urgency, frequency, hematuria, flank pain and difficulty urinating.  Endocrine: Denies: hot or cold intolerance, sweats, changes in hair or nails,  polyuria, polydipsia. Musculoskeletal: Denies myalgias, back pain, joint swelling, arthralgias and gait problem.  Skin: Denies pallor, rash and wound.  Neurological: Denies dizziness, seizures, syncope, weakness, light-headedness, numbness and headaches.  Hematological: Denies adenopathy. Easy bruising, personal or family bleeding history  Psychiatric/Behavioral: Denies suicidal ideation, mood changes, confusion, nervousness, sleep disturbance and agitation   Assessment and Plan:  Hospital discharge follow-up Pneumonia of left lower lobe due to infectious organism -She appears to be symptomatically improving. -Have advised that she complete full course of antibiotics as prescribed. -I would recommend a chest x-ray in 6 weeks to evaluate for complete resolution of her pneumonia.    I discussed the assessment and treatment plan with the patient. The patient was provided an opportunity to ask questions and all were answered. The patient agreed with the plan and demonstrated an understanding of the instructions.   The patient was advised to call back or seek an in-person evaluation if the symptoms worsen or if the condition fails to improve as anticipated.  I provided 18 minutes of non-face-to-face time during this encounter.   Lelon Frohlich, MD [facility]

## 2020-12-14 ENCOUNTER — Other Ambulatory Visit: Payer: Self-pay | Admitting: Cardiovascular Disease

## 2020-12-14 DIAGNOSIS — E785 Hyperlipidemia, unspecified: Secondary | ICD-10-CM

## 2021-01-09 ENCOUNTER — Other Ambulatory Visit: Payer: Self-pay | Admitting: Cardiovascular Disease

## 2021-01-09 DIAGNOSIS — E785 Hyperlipidemia, unspecified: Secondary | ICD-10-CM

## 2021-01-10 ENCOUNTER — Encounter: Payer: Medicare Other | Admitting: Internal Medicine

## 2021-01-12 ENCOUNTER — Other Ambulatory Visit: Payer: Self-pay

## 2021-01-12 ENCOUNTER — Encounter: Payer: Self-pay | Admitting: Internal Medicine

## 2021-01-12 ENCOUNTER — Ambulatory Visit (INDEPENDENT_AMBULATORY_CARE_PROVIDER_SITE_OTHER): Payer: Medicare Other | Admitting: Internal Medicine

## 2021-01-12 VITALS — BP 102/68 | HR 58 | Temp 98.0°F | Ht 63.0 in | Wt 130.7 lb

## 2021-01-12 DIAGNOSIS — Z0000 Encounter for general adult medical examination without abnormal findings: Secondary | ICD-10-CM | POA: Diagnosis not present

## 2021-01-12 DIAGNOSIS — Z8711 Personal history of peptic ulcer disease: Secondary | ICD-10-CM | POA: Diagnosis not present

## 2021-01-12 DIAGNOSIS — I2510 Atherosclerotic heart disease of native coronary artery without angina pectoris: Secondary | ICD-10-CM

## 2021-01-12 DIAGNOSIS — E785 Hyperlipidemia, unspecified: Secondary | ICD-10-CM | POA: Diagnosis not present

## 2021-01-12 DIAGNOSIS — I10 Essential (primary) hypertension: Secondary | ICD-10-CM | POA: Diagnosis not present

## 2021-01-12 DIAGNOSIS — R7302 Impaired glucose tolerance (oral): Secondary | ICD-10-CM | POA: Insufficient documentation

## 2021-01-12 DIAGNOSIS — Z9861 Coronary angioplasty status: Secondary | ICD-10-CM | POA: Diagnosis not present

## 2021-01-12 LAB — TSH: TSH: 2.71 u[IU]/mL (ref 0.35–4.50)

## 2021-01-12 LAB — CBC WITH DIFFERENTIAL/PLATELET
Basophils Absolute: 0 10*3/uL (ref 0.0–0.1)
Basophils Relative: 0.6 % (ref 0.0–3.0)
Eosinophils Absolute: 0.1 10*3/uL (ref 0.0–0.7)
Eosinophils Relative: 1.9 % (ref 0.0–5.0)
HCT: 41.4 % (ref 36.0–46.0)
Hemoglobin: 13.9 g/dL (ref 12.0–15.0)
Lymphocytes Relative: 36.2 % (ref 12.0–46.0)
Lymphs Abs: 1.9 10*3/uL (ref 0.7–4.0)
MCHC: 33.5 g/dL (ref 30.0–36.0)
MCV: 93.8 fl (ref 78.0–100.0)
Monocytes Absolute: 0.6 10*3/uL (ref 0.1–1.0)
Monocytes Relative: 11.1 % (ref 3.0–12.0)
Neutro Abs: 2.6 10*3/uL (ref 1.4–7.7)
Neutrophils Relative %: 50.2 % (ref 43.0–77.0)
Platelets: 143 10*3/uL — ABNORMAL LOW (ref 150.0–400.0)
RBC: 4.41 Mil/uL (ref 3.87–5.11)
RDW: 13.4 % (ref 11.5–15.5)
WBC: 5.2 10*3/uL (ref 4.0–10.5)

## 2021-01-12 LAB — COMPREHENSIVE METABOLIC PANEL
ALT: 22 U/L (ref 0–35)
AST: 22 U/L (ref 0–37)
Albumin: 4.3 g/dL (ref 3.5–5.2)
Alkaline Phosphatase: 48 U/L (ref 39–117)
BUN: 15 mg/dL (ref 6–23)
CO2: 30 mEq/L (ref 19–32)
Calcium: 9.8 mg/dL (ref 8.4–10.5)
Chloride: 106 mEq/L (ref 96–112)
Creatinine, Ser: 0.87 mg/dL (ref 0.40–1.20)
GFR: 59.8 mL/min — ABNORMAL LOW (ref >60.00)
Glucose, Bld: 99 mg/dL (ref 70–99)
Potassium: 4.4 mEq/L (ref 3.5–5.1)
Sodium: 141 mEq/L (ref 135–145)
Total Bilirubin: 0.5 mg/dL (ref 0.2–1.2)
Total Protein: 6.7 g/dL (ref 6.0–8.3)

## 2021-01-12 LAB — LIPID PANEL
Cholesterol: 131 mg/dL (ref 0–200)
HDL: 48.6 mg/dL (ref >39.00)
LDL Cholesterol: 56 mg/dL (ref 0–99)
NonHDL: 82
Total CHOL/HDL Ratio: 3
Triglycerides: 131 mg/dL (ref 0.0–149.0)
VLDL: 26.2 mg/dL (ref 0.0–40.0)

## 2021-01-12 LAB — HEMOGLOBIN A1C: Hgb A1c MFr Bld: 6.4 % (ref 4.6–6.5)

## 2021-01-12 NOTE — Addendum Note (Signed)
Addended by: Elmer Picker on: 01/12/2021 09:06 AM   Modules accepted: Orders

## 2021-01-12 NOTE — Progress Notes (Signed)
Established Patient Office Visit     This visit occurred during the SARS-CoV-2 public health emergency.  Safety protocols were in place, including screening questions prior to the visit, additional usage of staff PPE, and extensive cleaning of exam room while observing appropriate contact time as indicated for disinfecting solutions.    CC/Reason for Visit: Subsequent Medicare wellness visit and yearly follow-up chronic medical conditions  HPI: Rachel Nichols is a 87 y.o. female who is coming in today for the above mentioned reasons. Past Medical History is significant for: Coronary artery disease status post MI in 1997 and 2012.  She follows with her cardiologist, Dr. Claiborne Billings on an annual basis.  She is on metoprolol, aspirin and statin.  She also has a history of hyperlipidemia on Crestor and Zetia.  History of peptic ulcer disease/GERD no longer on PPIs.  She has no acute concerns today.  She is overdue for a tetanus and shingles vaccines but otherwise immunizations are up-to-date including COVID, she no longer does cancer screening due to her age.  She has routine eye and dental care, no perceived hearing issues.  Her only exercise consists of housework.   Past Medical/Surgical History: Past Medical History:  Diagnosis Date  . Adenomatous colon polyp 2002   Colonoscopy   . Benign neoplasm of colon 2004   Colonoscopy  . CAD (coronary artery disease)    echo 09/13/10- EF>55%, aortic valve is mildly sclerotic; myoview 02-20-12-no ischemia  . Carotid bruit    carotid dopplers 05/16/04- normal  . Diverticulosis 2002,2004,2007   Colonoscopy  . Duodenitis   . Fibrocystic breast disease   . Gastritis   . GERD (gastroesophageal reflux disease)   . Heart attack ([place]) 1993   Cardiolyte stress---  stents 2012  . Hiatal hernia   . History of blood transfusion    pt denies  . Hyperlipidemia   . Peptic ulcer due to Helicobacter pylori   . PUD (peptic ulcer disease)   . SCC (squamous cell  carcinoma) 04/24/2011   right jawline (CX35FU), left forearm (CX35FU)    Past Surgical History:  Procedure Laterality Date  . ANGIOPLASTY  1993  . APPENDECTOMY  1957  . BASAL CELL CARCINOMA EXCISION     From the nose  . CATARACT EXTRACTION  10/26/09   Right eye  . CATARACT EXTRACTION  11/09/09   Left eye  . CHOLECYSTECTOMY  1984  . CORONARY STENT PLACEMENT  01/10/11   hig grade ramus branch stenosis with mod LAD dz, PCI/stenting of ramus branch with a Resolute DES   . DILATION AND CURETTAGE OF UTERUS    . FINGER SURGERY  04/2011   Cyst removal, right hand forefinger  . LEFT OOPHORECTOMY  1957  . SQUAMOUS CELL CARCINOMA EXCISION Right 07/24/2018   Posterior forearm shave & ED&C    Social History:  reports that she quit smoking about 45 years ago. Her smoking use included cigarettes. She has a 30.00 pack-year smoking history. She has never used smokeless tobacco. She reports that she does not drink alcohol and does not use drugs.  Allergies: Allergies  Allergen Reactions  . Diltiazem Hcl   . Tape Other (See Comments)    It pulls the skin off.  . Tramadol Hcl     Family History:  Family History  Problem Relation Age of Onset  . Heart disease Brother 105       MI  . Heart disease Sister        stents  .  Neuropathy Sister   . Hypertension Sister   . Stroke Mother   . Heart disease Father   . Heart attack Father   . Stroke Maternal Grandfather   . Heart attack Paternal Grandmother   . Heart attack Paternal Grandfather   . Breast cancer Maternal Aunt        x 2 aunts  . Irritable bowel syndrome Brother   . Heart attack Brother   . Epilepsy Daughter   . Heart attack Paternal Aunt        x 9  . Colon cancer Neg Hx      Current Outpatient Medications:  .  aspirin 81 MG tablet, Take 81 mg by mouth daily., Disp: , Rfl:  .  cholecalciferol (VITAMIN D) 1000 UNITS tablet, Take 2,000 Units by mouth daily., Disp: , Rfl:  .  Coenzyme Q10 (CO Q10) 100 MG CAPS, Take 200 mg by  mouth daily., Disp: , Rfl:  .  Cyanocobalamin (VITAMIN B 12 PO), Take 500 mg by mouth daily., Disp: , Rfl:  .  ezetimibe (ZETIA) 10 MG tablet, TAKE 1 TABLET EVERY DAY, Disp: 30 tablet, Rfl: 0 .  metoprolol succinate (TOPROL-XL) 25 MG 24 hr tablet, TAKE 1/2 TABLET EVERY DAY, Disp: 45 tablet, Rfl: 1 .  Multiple Vitamins-Minerals (CENTRUM SILVER ULTRA WOMENS) TABS, Take 1 tablet by mouth daily., Disp: , Rfl:  .  Probiotic Product (PROBIOTIC-10 PO), Take by mouth., Disp: , Rfl:  .  rosuvastatin (CRESTOR) 10 MG tablet, TAKE 1 TABLET EVERY DAY, Disp: 30 tablet, Rfl: 0 .  saccharomyces boulardii (FLORASTOR) 250 MG capsule, Take 1 capsule (250 mg total) by mouth 2 (two) times daily. (Patient not taking: Reported on 01/12/2021), Disp: 180 capsule, Rfl: 1  Current Facility-Administered Medications:  .  triamcinolone acetonide (KENALOG) 10 MG/ML injection 10 mg, 10 mg, Other, Once, Stover, Pacheco, DPM  Review of Systems:  Constitutional: Denies fever, chills, diaphoresis, appetite change and fatigue.  HEENT: Denies photophobia, eye pain, redness, hearing loss, ear pain, congestion, sore throat, rhinorrhea, sneezing, mouth sores, trouble swallowing, neck pain, neck stiffness and tinnitus.   Respiratory: Denies SOB, DOE, cough, chest tightness,  and wheezing.   Cardiovascular: Denies chest pain, palpitations and leg swelling.  Gastrointestinal: Denies nausea, vomiting, abdominal pain, diarrhea, constipation, blood in stool and abdominal distention.  Genitourinary: Denies dysuria, urgency, frequency, hematuria, flank pain and difficulty urinating.  Endocrine: Denies: hot or cold intolerance, sweats, changes in hair or nails, polyuria, polydipsia. Musculoskeletal: Denies myalgias, back pain, joint swelling, arthralgias and gait problem.  Skin: Denies pallor, rash and wound.  Neurological: Denies dizziness, seizures, syncope, weakness, light-headedness, numbness and headaches.  Hematological: Denies  adenopathy. Easy bruising, personal or family bleeding history  Psychiatric/Behavioral: Denies suicidal ideation, mood changes, confusion, nervousness, sleep disturbance and agitation    Physical Exam: Vitals:   01/12/21 0832  BP: 102/68  Pulse: (!) 58  Temp: 98 F (36.7 C)  TempSrc: Oral  SpO2: 98%  Weight: 130 lb 11.2 oz (59.3 kg)  Height: '5\' 3"'  (1.6 m)    Body mass index is 23.15 kg/m.   Constitutional: NAD, calm, comfortable Eyes: PERRL, lids and conjunctivae normal ENMT: Mucous membranes are moist. Posterior pharynx clear of any exudate or lesions. Normal dentition. Tympanic membrane is pearly white, no erythema or bulging. Neck: normal, supple, no masses, no thyromegaly Respiratory: clear to auscultation bilaterally, no wheezing, no crackles. Normal respiratory effort. No accessory muscle use.  Cardiovascular: Regular rate and rhythm, no murmurs / rubs / gallops.  No extremity edema. 2+ pedal pulses.  Abdomen: no tenderness, no masses palpated. No hepatosplenomegaly. Bowel sounds positive.  Musculoskeletal: no clubbing / cyanosis. No joint deformity upper and lower extremities. Good ROM, no contractures. Normal muscle tone.  Skin: no rashes, lesions, ulcers. No induration Neurologic: CN 2-12 grossly intact. Sensation intact, DTR normal. Strength 5/5 in all 4.  Psychiatric: Normal judgment and insight. Alert and oriented x 3. Normal mood.    Subsequent Medicare wellness visit   1. Risk factors, based on past  M,S,F -cardiovascular disease risk factors include hyperlipidemia, history of coronary artery disease   2.  Physical activities: Housework only   3.  Depression/mood:  Stable, not depressed   4.  Hearing:  No perceived issues   5.  ADL's: Independent in all ADLs   6.  Fall risk:  Low fall risk   7.  Home safety: No problems identified   8.  Height weight, and visual acuity: height and weight as above, vision:   Visual Acuity Screening   Right eye Left  eye Both eyes  Without correction: '20/32 20/40 20/25 '  With correction:        9.  Counseling:  Advise yearly follow-up with me and her cardiologist, have advised that she get her shingles vaccination and tetanus vaccination at her pharmacy.   10. Lab orders based on risk factors: Laboratory update will be reviewed   11. Referral :  None today   12. Care plan:  Follow-up with me in 1 year   13. Cognitive assessment:  No cognitive impairment   14. Screening: Patient provided with a written and personalized 5-10 year screening schedule in the AVS.   yes   15. Provider List Update:   PCP, cardiologist Dr. Claiborne Billings  16. Advance Directives: Full code   17. Opioids: Patient is not on any opioid prescriptions and has no risk factors for a substance use disorder.   [place] Office Visit from 01/12/2021 in [place] at [facility]  PHQ-9 Total Score 0      Fall Risk  01/12/2021 01/06/2020 05/11/2019 02/12/2018 01/16/2017  Falls in the past year? 0 0 1 No No  Comment - - [facility] Telephone Survey: data to providers prior to load - -  Number falls in past yr: 0 0 1 - -  Comment - - Emmi Telephone Survey Actual Response = 1 - -  Injury with Fall? 0 0 1 - -  Comment - - - - -     Impression and Plan:  Encounter for subsequent annual wellness visit (AWV) in Medicare patient -She has routine eye and dental care. -She is overdue for Tdap and shingles vaccination which she has been advised to obtain at her pharmacy. -Screening labs today. -Healthy lifestyle discussed in detail. She no longer does cancer screening due to age.  She had a bone density test in 2021, redo in 2023.  Hyperlipidemia LDL goal <70  - Plan: Lipid panel  Essential hypertension  - Plan: CBC with Differential/Platelet, Comprehensive metabolic panel -Blood pressure is well controlled.  CAD S/P percutaneous coronary angioplasty  -Stable since her last MI with stent placement in 2012. -She follows yearly with  cardiology.  PUD, HX OF -This is remote, she is no longer on PPI therapy.   Patient Instructions   -Nice seeing you today!!  -Lab work today; will notify you once results are available.  -Remember to get your shingles and tetanus vaccines at the pharmacy.  -Schedule follow up in  1 year or sooner as needed.   Preventive Care 71 Years and Older, Female Preventive care refers to lifestyle choices and visits with your health care provider that can promote health and wellness. This includes:  A yearly physical exam. This is also called an annual wellness visit.  Regular dental and eye exams.  Immunizations.  Screening for certain conditions.  Healthy lifestyle choices, such as: ? Eating a healthy diet. ? Getting regular exercise. ? Not using drugs or products that contain nicotine and tobacco. ? Limiting alcohol use. What can I expect for my preventive care visit? Physical exam Your health care provider will check your:  Height and weight. These may be used to calculate your BMI (body mass index). BMI is a measurement that tells if you are at a healthy weight.  Heart rate and blood pressure.  Body temperature.  Skin for abnormal spots. Counseling Your health care provider may ask you questions about your:  Past medical problems.  Family's medical history.  Alcohol, tobacco, and drug use.  Emotional well-being.  Home life and relationship well-being.  Sexual activity.  Diet, exercise, and sleep habits.  History of falls.  Memory and ability to understand (cognition).  Work and work Statistician.  Pregnancy and menstrual history.  Access to firearms. What immunizations do I need? Vaccines are usually given at various ages, according to a schedule. Your health care provider will recommend vaccines for you based on your age, medical history, and lifestyle or other factors, such as travel or where you work.   What tests do I need? Blood tests  Lipid and  cholesterol levels. These may be checked every 5 years, or more often depending on your overall health.  Hepatitis C test.  Hepatitis B test. Screening  Lung cancer screening. You may have this screening every year starting at age 4 if you have a 30-pack-year history of smoking and currently smoke or have quit within the past 15 years.  Colorectal cancer screening. ? All adults should have this screening starting at age 60 and continuing until age 83. ? Your health care provider may recommend screening at age 13 if you are at increased risk. ? You will have tests every 1-10 years, depending on your results and the type of screening test.  Diabetes screening. ? This is done by checking your blood sugar (glucose) after you have not eaten for a while (fasting). ? You may have this done every 1-3 years.  Mammogram. ? This may be done every 1-2 years. ? Talk with your health care provider about how often you should have regular mammograms.  Abdominal aortic aneurysm (AAA) screening. You may need this if you are a current or former smoker.  BRCA-related cancer screening. This may be done if you have a family history of breast, ovarian, tubal, or peritoneal cancers. Other tests  STD (sexually transmitted disease) testing, if you are at risk.  Bone density scan. This is done to screen for osteoporosis. You may have this done starting at age 16. Talk with your health care provider about your test results, treatment options, and if necessary, the need for more tests. Follow these instructions at home: Eating and drinking  Eat a diet that includes fresh fruits and vegetables, whole grains, lean protein, and low-fat dairy products. Limit your intake of foods with high amounts of sugar, saturated fats, and salt.  Take vitamin and mineral supplements as recommended by your health care provider.  Do not drink alcohol if your health  care provider tells you not to drink.  If you drink  alcohol: ? Limit how much you have to 0-1 drink a day. ? Be aware of how much alcohol is in your drink. In the [place] one drink equals one 12 oz bottle of beer (355 mL), one 5 oz glass of wine (148 mL), or one 1 oz glass of hard liquor (44 mL).   Lifestyle  Take daily care of your teeth and gums. Brush your teeth every morning and night with fluoride toothpaste. Floss one time each day.  Stay active. Exercise for at least 30 minutes 5 or more days each week.  Do not use any products that contain nicotine or tobacco, such as cigarettes, e-cigarettes, and chewing tobacco. If you need help quitting, ask your health care provider.  Do not use drugs.  If you are sexually active, practice safe sex. Use a condom or other form of protection in order to prevent STIs (sexually transmitted infections).  Talk with your health care provider about taking a low-dose aspirin or statin.  Find healthy ways to cope with stress, such as: ? Meditation, yoga, or listening to music. ? Journaling. ? Talking to a trusted person. ? Spending time with friends and family. Safety  Always wear your seat belt while driving or riding in a vehicle.  Do not drive: ? If you have been drinking alcohol. Do not ride with someone who has been drinking. ? When you are tired or distracted. ? While texting.  Wear a helmet and other protective equipment during sports activities.  If you have firearms in your house, make sure you follow all gun safety procedures. What's next?  Visit your health care provider once a year for an annual wellness visit.  Ask your health care provider how often you should have your eyes and teeth checked.  Stay up to date on all vaccines. This information is not intended to replace advice given to you by your health care provider. Make sure you discuss any questions you have with your health care provider. Document Revised: 09/14/2020 Document Reviewed: 09/18/2018 Elsevier Patient  Education  2021 [place], MD [facility]

## 2021-01-12 NOTE — Patient Instructions (Signed)
-Nice seeing you today!!  -Lab work today; will notify you once results are available.  -Remember to get your shingles and tetanus vaccines at the pharmacy.  -Schedule follow up in 1 year or sooner as needed.   Preventive Care 85 Years and Older, Female Preventive care refers to lifestyle choices and visits with your health care provider that can promote health and wellness. This includes:  A yearly physical exam. This is also called an annual wellness visit.  Regular dental and eye exams.  Immunizations.  Screening for certain conditions.  Healthy lifestyle choices, such as: ? Eating a healthy diet. ? Getting regular exercise. ? Not using drugs or products that contain nicotine and tobacco. ? Limiting alcohol use. What can I expect for my preventive care visit? Physical exam Your health care provider will check your:  Height and weight. These may be used to calculate your BMI (body mass index). BMI is a measurement that tells if you are at a healthy weight.  Heart rate and blood pressure.  Body temperature.  Skin for abnormal spots. Counseling Your health care provider may ask you questions about your:  Past medical problems.  Family's medical history.  Alcohol, tobacco, and drug use.  Emotional well-being.  Home life and relationship well-being.  Sexual activity.  Diet, exercise, and sleep habits.  History of falls.  Memory and ability to understand (cognition).  Work and work Astronomer.  Pregnancy and menstrual history.  Access to firearms. What immunizations do I need? Vaccines are usually given at various ages, according to a schedule. Your health care provider will recommend vaccines for you based on your age, medical history, and lifestyle or other factors, such as travel or where you work.   What tests do I need? Blood tests  Lipid and cholesterol levels. These may be checked every 5 years, or more often depending on your overall  health.  Hepatitis C test.  Hepatitis B test. Screening  Lung cancer screening. You may have this screening every year starting at age 58 if you have a 30-pack-year history of smoking and currently smoke or have quit within the past 15 years.  Colorectal cancer screening. ? All adults should have this screening starting at age 46 and continuing until age 17. ? Your health care provider may recommend screening at age 60 if you are at increased risk. ? You will have tests every 1-10 years, depending on your results and the type of screening test.  Diabetes screening. ? This is done by checking your blood sugar (glucose) after you have not eaten for a while (fasting). ? You may have this done every 1-3 years.  Mammogram. ? This may be done every 1-2 years. ? Talk with your health care provider about how often you should have regular mammograms.  Abdominal aortic aneurysm (AAA) screening. You may need this if you are a current or former smoker.  BRCA-related cancer screening. This may be done if you have a family history of breast, ovarian, tubal, or peritoneal cancers. Other tests  STD (sexually transmitted disease) testing, if you are at risk.  Bone density scan. This is done to screen for osteoporosis. You may have this done starting at age 60. Talk with your health care provider about your test results, treatment options, and if necessary, the need for more tests. Follow these instructions at home: Eating and drinking  Eat a diet that includes fresh fruits and vegetables, whole grains, lean protein, and low-fat dairy products. Limit your intake  of foods with high amounts of sugar, saturated fats, and salt.  Take vitamin and mineral supplements as recommended by your health care provider.  Do not drink alcohol if your health care provider tells you not to drink.  If you drink alcohol: ? Limit how much you have to 0-1 drink a day. ? Be aware of how much alcohol is in your  drink. In the [place] one drink equals one 12 oz bottle of beer (355 mL), one 5 oz glass of wine (148 mL), or one 1 oz glass of hard liquor (44 mL).   Lifestyle  Take daily care of your teeth and gums. Brush your teeth every morning and night with fluoride toothpaste. Floss one time each day.  Stay active. Exercise for at least 30 minutes 5 or more days each week.  Do not use any products that contain nicotine or tobacco, such as cigarettes, e-cigarettes, and chewing tobacco. If you need help quitting, ask your health care provider.  Do not use drugs.  If you are sexually active, practice safe sex. Use a condom or other form of protection in order to prevent STIs (sexually transmitted infections).  Talk with your health care provider about taking a low-dose aspirin or statin.  Find healthy ways to cope with stress, such as: ? Meditation, yoga, or listening to music. ? Journaling. ? Talking to a trusted person. ? Spending time with friends and family. Safety  Always wear your seat belt while driving or riding in a vehicle.  Do not drive: ? If you have been drinking alcohol. Do not ride with someone who has been drinking. ? When you are tired or distracted. ? While texting.  Wear a helmet and other protective equipment during sports activities.  If you have firearms in your house, make sure you follow all gun safety procedures. What's next?  Visit your health care provider once a year for an annual wellness visit.  Ask your health care provider how often you should have your eyes and teeth checked.  Stay up to date on all vaccines. This information is not intended to replace advice given to you by your health care provider. Make sure you discuss any questions you have with your health care provider. Document Revised: 09/14/2020 Document Reviewed: 09/18/2018 Elsevier Patient Education  2021 [employer]  

## 2021-01-23 ENCOUNTER — Other Ambulatory Visit: Payer: Self-pay | Admitting: Cardiovascular Disease

## 2021-01-23 NOTE — Telephone Encounter (Signed)
Rx has been sent to the pharmacy electronically. ° °

## 2021-02-17 ENCOUNTER — Other Ambulatory Visit: Payer: Self-pay | Admitting: Cardiovascular Disease

## 2021-02-28 ENCOUNTER — Encounter: Payer: Self-pay | Admitting: *Deleted

## 2021-03-22 ENCOUNTER — Other Ambulatory Visit: Payer: Self-pay

## 2021-03-22 DIAGNOSIS — E785 Hyperlipidemia, unspecified: Secondary | ICD-10-CM

## 2021-03-22 MED ORDER — EZETIMIBE 10 MG PO TABS: 10.0000 mg | ORAL_TABLET | Freq: Every day | ORAL | 3 refills | Status: DC

## 2021-04-03 ENCOUNTER — Other Ambulatory Visit: Payer: Self-pay | Admitting: Cardiovascular Disease

## 2021-04-17 DIAGNOSIS — Z961 Presence of intraocular lens: Secondary | ICD-10-CM | POA: Diagnosis not present

## 2021-05-01 ENCOUNTER — Telehealth: Payer: Self-pay | Admitting: Internal Medicine

## 2021-05-01 NOTE — Telephone Encounter (Signed)
Patient is requesting her 2nd shingles injection as soon as possible.

## 2021-05-02 NOTE — Telephone Encounter (Signed)
Spoke with patient and informed her that she will need to go to the pharmacy for a shingles vaccine.

## 2021-05-12 ENCOUNTER — Encounter: Payer: Self-pay | Admitting: Cardiovascular Disease

## 2021-05-12 ENCOUNTER — Ambulatory Visit (INDEPENDENT_AMBULATORY_CARE_PROVIDER_SITE_OTHER): Payer: Medicare Other | Admitting: Cardiovascular Disease

## 2021-05-12 ENCOUNTER — Other Ambulatory Visit: Payer: Self-pay

## 2021-05-12 DIAGNOSIS — I358 Other nonrheumatic aortic valve disorders: Secondary | ICD-10-CM | POA: Diagnosis not present

## 2021-05-12 DIAGNOSIS — I2510 Atherosclerotic heart disease of native coronary artery without angina pectoris: Secondary | ICD-10-CM

## 2021-05-12 DIAGNOSIS — E785 Hyperlipidemia, unspecified: Secondary | ICD-10-CM | POA: Diagnosis not present

## 2021-05-12 DIAGNOSIS — Z9861 Coronary angioplasty status: Secondary | ICD-10-CM

## 2021-05-12 NOTE — Progress Notes (Signed)
Patient ID: Rachel Nichols, female   DOB: 1931-03-22, 87 y.o.   MRN: [id number]       HPI: Rachel Nichols, is a 87 y.o. female who presents to the office for a 20 month cardiology evaluation.  [name] is  suffered a myocardial infarction in January 1993 and underwent PTCA of her RCA. In April 2012, a 2.5x12 mm Resolute DES stent was inserted to the proximal portion of a ramus intermediate vessel. She  had 60% LAD stenosis after diagonal vessel as well as 40-50% mid right coronary artery stenosis for which she's been treated medically. A myocardial perfusion study in May 2013  remained normal. She has continued to remain stable on her current medical regimen. We had increased her Crestor to 10 mg to take in addition to this area there LDL particle concentration was still elevated at 1387. Subsequent blood work on 11/10/2012 showed marked improvement with her LDL now at 61 and LDL particle number at 1027 the total cholesterol was 126, HDL 43, triglycerides 108. Her insulin resistance score was still slightly elevated at 56.  WhenI saw her in April 2016 for 18 month follow-up evaluation, her cardiac murmur was more pronounced.  I scheduled her for an echo Doppler study.  This was done on 06/27/2015.  LV function was normal with an ejection fraction of 65-70%.  There was grade 1 diastolic dysfunction.  There were no wall motion abnormalities.  She had mild left atrial dilatation.  She was not found to have significant valvular pathology.   I saw her in October 2018 at which time she was continuing to do well. Specifically, she denied an any chest pain or palpitations.  She remained active and was swimming several days per week.  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  She was on metoprolol 12.5 mg daily and had reduced to rosuvastatin to only 5 mg but was continued to take Zetia.  She had noticed myalgias on higher dose and was also taking coenzyme Q  10.  When I last saw her, I recommended follow-up laboratory.  Lipid studies in May 2019  showed a total cholesterol of 133, triglycerides 158, HDL 46, and LDL 56.  Her triglycerides had improved from a previous high of 194.  Hemoglobin A1c was 6.4.    I saw her in December 2019 at which time she remained stable and felt well..  Since I saw her, she underwent a follow-up echo Doppler study in June 2020 which showed normal systolic and diastolic function with EF at 60 to 65%.  There was mild aortic sclerosis without stenosis.  She had a telemedicine evaluation with Rachel Nichols in follow-up of that echo.   She continues to go to a pool and walks labs.  She no longer swims.  She denies any chest pain or palpitations.  She is sleeping fairly well and does not have any daytime sleepiness although does take an occasional nap.  She is unaware of any anginal type symptoms.  She denies presyncope or syncope.  She has continued to be on Zetia and rosuvastatin 10 mg for hyperlipidemia.  Most recent lipid studies in July 2020 showed a total cholesterol 135 and LDL cholesterol of 57.  She has normal renal function with a creatinine of 0.9.  Hemoglobin A1c in 2019 was in the prediabetic range at 6.4.  She is now followed by Dr. Thersa Salt for primary care.  Since I last saw her, she has remained active and denies any chest pain or shortness of breath.  Rachel Nichols was her birthday and she turned 87 years old.  She remains fairly active.  She had presented to the emergency room over the past year with complaints of productive cough she continues to be on aspirin 81 mg daily.  She is on Zetia 10 mg and rosuvastatin 10 mg for hyperlipidemia.  She had undergone laboratory with her primary physician Dr. Narda Nichols in April 2022.  Hemoglobin hematocrit were stable at 13.9 and 41.4, respectively.  TSH was 2.71.  Lipid studies were excellent with total cholesterol 131 LDL cholesterol 56 triglycerides 131 and HDL  48.6.  She is prediabetic and hemoglobin A1c was 6.4.  He presents for evaluation.  Past Medical History:  Diagnosis Date   Adenomatous colon polyp 2002   Colonoscopy    Benign neoplasm of colon 2004   Colonoscopy   CAD (coronary artery disease)    echo 09/13/10- EF>55%, aortic valve is mildly sclerotic; myoview 02-20-12-no ischemia   Carotid bruit    carotid dopplers 05/16/04- normal   Diverticulosis 2002,2004,2007   Colonoscopy   Duodenitis    Fibrocystic breast disease    Gastritis    GERD (gastroesophageal reflux disease)    Heart attack ([place]) 1993   Cardiolyte stress---  stents 2012   Hiatal hernia    History of blood transfusion    pt denies   Hyperlipidemia    Peptic ulcer due to Helicobacter pylori    PUD (peptic ulcer disease)    SCC (squamous cell carcinoma) 04/24/2011   right jawline (CX35FU), left forearm (CX35FU)    Past Surgical History:  Procedure Laterality Date   [place]   BASAL CELL CARCINOMA EXCISION     From the nose   CATARACT EXTRACTION  10/26/09   Right eye   CATARACT EXTRACTION  11/09/09   Left eye   CHOLECYSTECTOMY  1984   CORONARY STENT PLACEMENT  01/10/11   hig grade ramus branch stenosis with mod LAD dz, PCI/stenting of ramus branch with a Resolute DES    DILATION AND CURETTAGE OF UTERUS     FINGER SURGERY  04/2011   Cyst removal, right hand forefinger   LEFT OOPHORECTOMY  1957   SQUAMOUS CELL CARCINOMA EXCISION Right 07/24/2018   Posterior forearm shave & ED&C    Allergies  Allergen Reactions   Diltiazem Hcl    Tape Other (See Comments)    It pulls the skin off.   Tramadol Hcl     Current Outpatient Medications  Medication Sig Dispense Refill   aspirin 81 MG tablet Take 81 mg by mouth daily.     cholecalciferol (VITAMIN D) 1000 UNITS tablet Take 2,000 Units by mouth daily.     Coenzyme Q10 (CO Q10) 100 MG CAPS Take 200 mg by mouth daily.     Cyanocobalamin (VITAMIN B 12 PO) Take 500 mg by mouth daily.      ezetimibe (ZETIA) 10 MG tablet Take 1 tablet (10 mg total) by mouth daily. 30 tablet 3   metoprolol succinate (TOPROL-XL) 25 MG 24 hr tablet TAKE 1/2 TABLET EVERY DAY NEED APPOINTMENT 45 tablet 0   Multiple Vitamins-Minerals (CENTRUM SILVER ULTRA WOMENS) TABS Take 1 tablet by mouth daily.     Probiotic Product (PROBIOTIC-10 PO) Take by mouth.     rosuvastatin (CRESTOR) 10 MG tablet Take 1 tablet (10 mg total) by mouth daily.  Must attend upcoming appointment for future refills 90 tablet 0   saccharomyces boulardii (FLORASTOR) 250 MG capsule Take 1 capsule (250 mg total) by mouth 2 (two) times daily. 180 capsule 1   Current Facility-Administered Medications  Medication Dose Route Frequency Provider Last Rate Last Admin   triamcinolone acetonide (KENALOG) 10 MG/ML injection 10 mg  10 mg Other Once Landis Martins, DPM       Socially she is married. Has one child who is living and one child who is deceased. She has 3 grandchildren. There is no tobacco or alcohol use.  Her father died at age 29, her mother died at age 20.  She has a sister who is now 16 years old.  Her brother died at age 78.  ROS General: Negative; No fevers, chills, or night sweats HEENT: Negative; No changes in vision or hearing, sinus congestion, difficulty swallowing Pulmonary: Negative; No cough, wheezing, shortness of breath, hemoptysis Cardiovascular: See HPI GI: Negative; No nausea, vomiting, diarrhea, or abdominal pain GU: Negative; No dysuria, hematuria, or difficulty voiding Musculoskeletal: Intermittent cramps in her legs and feet. Hematologic/Oncology: Negative; no easy bruising, bleeding Endocrine: Negative; no heat/cold intolerance; no diabetes Neuro: Negative; no changes in balance, headaches Skin: Negative; No rashes or skin lesions Psychiatric: Negative; No behavioral problems, depression Sleep: Negative; No snoring, daytime sleepiness, hypersomnolence, bruxism, restless legs, hypnogognic hallucinations,  no cataplexy Other comprehensive 14 point system review is negative.   PE BP 120/60 (BP Location: Right Arm)   Pulse 60   Ht '5\' 4"'$  (1.626 m)   Wt 134 lb 6.4 oz (61 kg)   SpO2 98%   BMI 23.07 kg/m    Repeat blood pressure by me was 114/62  Wt Readings from Last 3 Encounters:  05/12/21 134 lb 6.4 oz (61 kg)  01/12/21 130 lb 11.2 oz (59.3 kg)  01/06/20 135 lb 9.6 oz (61.5 kg)   General: Alert, oriented, no distress.  Skin: normal turgor, no rashes, warm and dry HEENT: Normocephalic, atraumatic. Pupils equal round and reactive to light; sclera anicteric; extraocular muscles intact;  Nose without nasal septal hypertrophy Mouth/Parynx benign; Mallinpatti scale 3 Neck: No JVD, no carotid bruits; normal carotid upstroke Lungs: clear to ausculatation and percussion; no wheezing or rales Chest wall: without tenderness to palpitation Heart: PMI not displaced, RRR, s1 s2 normal, 2/6 systolic murmur, no diastolic murmur, no rubs, gallops, thrills, or heaves Abdomen: soft, nontender; no hepatosplenomehaly, BS+; abdominal aorta nontender and not dilated by palpation. Back: no CVA tenderness Pulses 2+ Musculoskeletal: full range of motion, normal strength, no joint deformities Extremities: no clubbing cyanosis or edema, Homan's sign negative  Neurologic: grossly nonfocal; Cranial nerves grossly wnl Psychologic: Normal mood and affect  May 12, 2021 ECG (independently read by me):  NSR at 60, mild sinus arrhythmia , normal intervals  December 2020 ECG (independently read by me): Sinus bradycardia 59 bpm.  No ST segment changes.  No ectopy.  Normal intervals.  Mild left axis deviation.  December 2019 ECG (independently read by me): Normal sinus rhythm at 68 bpm.  No ectopy.  Normal intervals.  October 2018 ECG (independently read by me): Normal sinus rhythm at 74 bpm.  Left axis deviation.  QTc interval 472 ms.  October 2017 ECG (independently read by me): Sinus bradycardia 56 bpm.   Normal intervals.  No ectopy.  October 2016 ECG (independently read by me): Sinus bradycardia at 55 bpm.  No ectopy.  Normal intervals.  April 2016 ECG (independently read by me): Sinus  bradycardia 59 bpm.  No ectopy.  Pior ECG: Sinus rhythm at 53 beats per minute. PR interval 166 ms QTc interval 426 ms.  LABS  BMP Latest Ref Rng & Units 01/12/2021 08/29/2020 01/06/2020  Glucose 70 - 99 mg/dL 99 126(H) 105(H)  BUN 6 - 23 mg/dL '15 15 16  '$ Creatinine 0.40 - 1.20 mg/dL 0.87 0.93 0.84  BUN/Creat Ratio 12 - 28 - - -  Sodium 135 - 145 mEq/L 141 132(L) 140  Potassium 3.5 - 5.1 mEq/L 4.4 3.7 4.8  Chloride 96 - 112 mEq/L 106 99 106  CO2 19 - 32 mEq/L 30 21(L) 29  Calcium 8.4 - 10.5 mg/dL 9.8 9.7 9.8      Component Value Date/Time   PROT 6.7 01/12/2021 0907   PROT 7.1 04/09/2019 1026   ALBUMIN 4.3 01/12/2021 0907   ALBUMIN 4.7 (H) 04/09/2019 1026   AST 22 01/12/2021 0907   ALT 22 01/12/2021 0907   ALKPHOS 48 01/12/2021 0907   BILITOT 0.5 01/12/2021 0907   BILITOT 0.5 04/09/2019 1026   BILIDIR 0.1 01/11/2014 1507    CBC Latest Ref Rng & Units 01/12/2021 08/29/2020 01/06/2020  WBC 4.0 - 10.5 K/uL 5.2 13.0(H) 5.7  Hemoglobin 12.0 - 15.0 g/dL 13.9 12.5 14.0  Hematocrit 36.0 - 46.0 % 41.4 38.4 41.6  Platelets 150.0 - 400.0 K/uL 143.0(L) 301 150.0   Lab Results  Component Value Date   MCV 93.8 01/12/2021   MCV 95.3 08/29/2020   MCV 95.5 01/06/2020   Lab Results  Component Value Date   TSH 2.71 01/12/2021   Lab Results  Component Value Date   HGBA1C 6.4 01/12/2021   Lipid Panel     Component Value Date/Time   CHOL 131 01/12/2021 0907   CHOL 135 04/09/2019 1026   TRIG 131.0 01/12/2021 0907   HDL 48.60 01/12/2021 0907   HDL 48 04/09/2019 1026   CHOLHDL 3 01/12/2021 0907   VLDL 26.2 01/12/2021 0907   LDLCALC 56 01/12/2021 0907   LDLCALC 57 04/09/2019 1026     BNP    Component Value Date/Time   PROBNP 86.0 01/10/2011 1357    Lipid Panel     Component Value Date/Time    CHOL 131 01/12/2021 0907   CHOL 135 04/09/2019 1026   TRIG 131.0 01/12/2021 0907   HDL 48.60 01/12/2021 0907   HDL 48 04/09/2019 1026   CHOLHDL 3 01/12/2021 0907   VLDL 26.2 01/12/2021 0907   LDLCALC 56 01/12/2021 0907   LDLCALC 57 04/09/2019 1026     RADIOLOGY: No results found.  IMPRESSION: No diagnosis found.   ASSESSMENT AND PLAN: Ms. Lance is a young appearing 87 year old Caucasian female who is 29 years status post her initial myocardial infarction which time she underwent PTCA of RCA in 1993.  In 2012 she underwent successful DES stenting of the ramus intermediate vessel and had concomitant 60% LAD stenosis after diagonal vessel and 40-50% mid RCA stenoses/.  Over these many years, she has remained stable and has been free of recurrent anginal symptomatology.  She has been aggressively treated with lipid management and LDL lipid studies consistently in the 50s and most recently 56 on January 12, 2021 on her combination therapy with rosuvastatin and Zetia.  Her cardiac murmur is consistent with aortic valve sclerosis.  There is no stenosis noted on her echo study.  Her blood pressure today is excellent and a repeat by me was 114/62.  She continues to be active and remains asymptomatic.  She  has a prescription for metoprolol to take 12.5 mg as needed.  She has not required use of this recently.  Most recent laboratory is stable.  I have recommended she continue current therapy.  I will see her in 1 year for reevaluation or sooner as needed.   Troy Sine, MD, [facility]  05/12/2021 2:06 PM

## 2021-05-12 NOTE — Patient Instructions (Signed)

## 2021-05-29 ENCOUNTER — Other Ambulatory Visit: Payer: Self-pay | Admitting: Cardiovascular Disease

## 2021-05-29 DIAGNOSIS — E785 Hyperlipidemia, unspecified: Secondary | ICD-10-CM

## 2021-07-04 DIAGNOSIS — L821 Other seborrheic keratosis: Secondary | ICD-10-CM | POA: Diagnosis not present

## 2021-07-04 DIAGNOSIS — H61002 Unspecified perichondritis of left external ear: Secondary | ICD-10-CM | POA: Diagnosis not present

## 2021-07-04 DIAGNOSIS — L812 Freckles: Secondary | ICD-10-CM | POA: Diagnosis not present

## 2021-07-04 DIAGNOSIS — Z85828 Personal history of other malignant neoplasm of skin: Secondary | ICD-10-CM | POA: Diagnosis not present

## 2021-07-05 ENCOUNTER — Other Ambulatory Visit: Payer: Self-pay | Admitting: Cardiovascular Disease

## 2021-10-04 DIAGNOSIS — Z20822 Contact with and (suspected) exposure to covid-19: Secondary | ICD-10-CM | POA: Diagnosis not present

## 2022-01-08 ENCOUNTER — Telehealth: Payer: Self-pay

## 2022-01-08 ENCOUNTER — Telehealth: Payer: Self-pay | Admitting: Internal Medicine

## 2022-01-08 DIAGNOSIS — E785 Hyperlipidemia, unspecified: Secondary | ICD-10-CM

## 2022-01-08 MED ORDER — EZETIMIBE 10 MG PO TABS: ORAL_TABLET | ORAL | 0 refills | Status: DC

## 2022-01-08 NOTE — Telephone Encounter (Signed)
Refill sent.

## 2022-01-08 NOTE — Telephone Encounter (Signed)
Attempted to call pt to get AWV scheduled with PCP. ?

## 2022-01-08 NOTE — Telephone Encounter (Signed)
Patient called in requesting a medication refill for ezetimibe (ZETIA) 10 MG tablet [[id number]]  to be sent to her pharmacy. ? ?Pharmacy: [facility] at [address] [place], [place] [address]. ? ?Please advise. ?

## 2022-01-09 ENCOUNTER — Telehealth: Payer: Self-pay | Admitting: Internal Medicine

## 2022-01-09 NOTE — Telephone Encounter (Signed)
noted 

## 2022-01-09 NOTE — Telephone Encounter (Signed)
Patient stated that [employer] informed her to tell Dr.Hernandez to call them to set up for her to get her medication shipped through the mail.  ? ?She said the pharmacy is [facility] ? ?The number is [phone]. ? ?Fax number: [phone] ? ?Please advise ?

## 2022-03-28 NOTE — Progress Notes (Unsigned)
ACUTE VISIT No chief complaint on file.  HPI: [name] is a 88 y.o. female, who is here today complaining of *** HPI  Review of Systems Rest see pertinent positives and negatives per HPI.  Current Outpatient Medications on File Prior to Visit  Medication Sig Dispense Refill  . aspirin 81 MG tablet Take 81 mg by mouth daily.    . cholecalciferol (VITAMIN D) 1000 UNITS tablet Take 2,000 Units by mouth daily.    . Coenzyme Q10 (CO Q10) 100 MG CAPS Take 200 mg by mouth daily.    . Cyanocobalamin (VITAMIN B 12 PO) Take 500 mg by mouth daily.    Marland Kitchen ezetimibe (ZETIA) 10 MG tablet TAKE 1 TABLET EVERY DAY (NEED MD APPOINTMENT FOR REFILLS) 90 tablet 0  . metoprolol succinate (TOPROL-XL) 25 MG 24 hr tablet TAKE 1/2 TABLET EVERY DAY NEED APPOINTMENT 45 tablet 0  . Multiple Vitamins-Minerals (CENTRUM SILVER ULTRA WOMENS) TABS Take 1 tablet by mouth daily.    . Probiotic Product (PROBIOTIC-10 PO) Take by mouth.    . rosuvastatin (CRESTOR) 10 MG tablet TAKE 1 TABLET EVERY DAY. MUST ATTEND UPCOMING APPOINTMENT FOR FUTURE REFILLS 90 tablet 3  . saccharomyces boulardii (FLORASTOR) 250 MG capsule Take 1 capsule (250 mg total) by mouth 2 (two) times daily. 180 capsule 1   Current Facility-Administered Medications on File Prior to Visit  Medication Dose Route Frequency Provider Last Rate Last Admin  . triamcinolone acetonide (KENALOG) 10 MG/ML injection 10 mg  10 mg Other Once Landis Martins, DPM         Past Medical History:  Diagnosis Date  . Adenomatous colon polyp 2002   Colonoscopy   . Benign neoplasm of colon 2004   Colonoscopy  . CAD (coronary artery disease)    echo 09/13/10- EF>55%, aortic valve is mildly sclerotic; myoview 02-20-12-no ischemia  . Carotid bruit    carotid dopplers 05/16/04- normal  . Diverticulosis 2002,2004,2007   Colonoscopy  . Duodenitis   . Fibrocystic breast disease   . Gastritis   . GERD (gastroesophageal reflux disease)   . Heart attack ([place]) 1993    Cardiolyte stress---  stents 2012  . Hiatal hernia   . History of blood transfusion    pt denies  . Hyperlipidemia   . Peptic ulcer due to Helicobacter pylori   . PUD (peptic ulcer disease)   . SCC (squamous cell carcinoma) 04/24/2011   right jawline (CX35FU), left forearm (CX35FU)   Allergies  Allergen Reactions  . Diltiazem Hcl   . Tape Other (See Comments)    It pulls the skin off.  . Tramadol Hcl     Social History   Socioeconomic History  . Marital status: Married    Spouse name: Not on file  . Number of children: 2  . Years of education: Not on file  . Highest education level: Not on file  Occupational History  . Occupation: retired Research scientist (physical sciences): RETIRED  Tobacco Use  . Smoking status: Former    Packs/day: 2.00    Years: 15.00    Total pack years: 30.00    Types: Cigarettes    Quit date: 10/09/1975    Years since quitting: 46.4  . Smokeless tobacco: Never  Substance and Sexual Activity  . Alcohol use: No    Comment: none  . Drug use: No  . Sexual activity: Not Currently    Partners: Male  Other Topics Concern  . Not on file  Social History Narrative   Exercise--  Gym 3 days a week,   2 days swim   Social Determinants of Health   Financial Resource Strain: Not on file  Food Insecurity: Not on file  Transportation Needs: Not on file  Physical Activity: Not on file  Stress: Not on file  Social Connections: Not on file    There were no vitals filed for this visit. There is no height or weight on file to calculate BMI.  Physical Exam  ASSESSMENT AND PLAN:  There are no diagnoses linked to this encounter.   No follow-ups on file.   Betty G. Martinique, MD  [facility]. [place] office.  Discharge Instructions   None

## 2022-03-30 ENCOUNTER — Encounter: Payer: Self-pay | Admitting: Family Medicine

## 2022-03-30 ENCOUNTER — Ambulatory Visit (INDEPENDENT_AMBULATORY_CARE_PROVIDER_SITE_OTHER): Payer: Medicare HMO | Admitting: Family Medicine

## 2022-03-30 VITALS — BP 100/60 | HR 70 | Resp 16 | Ht 64.0 in | Wt 126.5 lb

## 2022-03-30 DIAGNOSIS — M79604 Pain in right leg: Secondary | ICD-10-CM | POA: Diagnosis not present

## 2022-03-30 DIAGNOSIS — M5416 Radiculopathy, lumbar region: Secondary | ICD-10-CM

## 2022-03-30 MED ORDER — PREDNISONE 20 MG PO TABS: ORAL_TABLET | ORAL | 0 refills | Status: AC

## 2022-04-12 ENCOUNTER — Ambulatory Visit (INDEPENDENT_AMBULATORY_CARE_PROVIDER_SITE_OTHER): Payer: Medicare HMO | Admitting: Internal Medicine

## 2022-04-12 ENCOUNTER — Encounter: Payer: Self-pay | Admitting: Internal Medicine

## 2022-04-12 VITALS — BP 120/60 | HR 60 | Temp 98.2°F | Ht 64.0 in | Wt 127.4 lb

## 2022-04-12 DIAGNOSIS — E785 Hyperlipidemia, unspecified: Secondary | ICD-10-CM

## 2022-04-12 DIAGNOSIS — M5431 Sciatica, right side: Secondary | ICD-10-CM

## 2022-04-12 MED ORDER — METHYLPREDNISOLONE ACETATE 40 MG/ML IJ SUSP
40.0000 mg | Freq: Once | INTRAMUSCULAR | Status: AC
  Administered 2022-04-12: 40 mg via INTRAMUSCULAR

## 2022-04-12 MED ORDER — MELOXICAM 7.5 MG PO TABS: 7.5000 mg | ORAL_TABLET | Freq: Every day | ORAL | 0 refills | Status: DC

## 2022-04-12 MED ORDER — METHYLPREDNISOLONE ACETATE 40 MG/ML IJ SUSP: 40.0000 mg | Freq: Once | INTRAMUSCULAR | Status: DC

## 2022-04-12 MED ORDER — EZETIMIBE 10 MG PO TABS: ORAL_TABLET | ORAL | 1 refills | Status: DC

## 2022-04-12 NOTE — Progress Notes (Signed)
Established Patient Office Visit     CC/Reason for Visit: Right buttock and posterior right leg pain  HPI: Rachel Nichols is a 88 y.o. female who is coming in today for the above mentioned reasons.  She was seen on 6/28 in clinic for the same.  She was given prednisone that helped some but is still in significant pain.  She describes pain and numbness down the posterior part of her thigh and buttock, at around the knee area it shifts forward.  She is having difficulty as she is the primary caregiver for her spouse who has Alzheimer's dementia.  Past Medical/Surgical History: Past Medical History:  Diagnosis Date   Adenomatous colon polyp 2002   Colonoscopy    Benign neoplasm of colon 2004   Colonoscopy   CAD (coronary artery disease)    echo 09/13/10- EF>55%, aortic valve is mildly sclerotic; myoview 02-20-12-no ischemia   Carotid bruit    carotid dopplers 05/16/04- normal   Diverticulosis 2002,2004,2007   Colonoscopy   Duodenitis    Fibrocystic breast disease    Gastritis    GERD (gastroesophageal reflux disease)    Heart attack ([place]) 1993   Cardiolyte stress---  stents 2012   Hiatal hernia    History of blood transfusion    pt denies   Hyperlipidemia    Peptic ulcer due to Helicobacter pylori    PUD (peptic ulcer disease)    SCC (squamous cell carcinoma) 04/24/2011   right jawline (CX35FU), left forearm (CX35FU)    Past Surgical History:  Procedure Laterality Date   [place]     From the nose   CATARACT EXTRACTION  10/26/09   Right eye   CATARACT EXTRACTION  11/09/09   Left eye   CHOLECYSTECTOMY  1984   CORONARY STENT PLACEMENT  01/10/11   hig grade ramus branch stenosis with mod LAD dz, PCI/stenting of ramus branch with a Resolute DES    DILATION AND CURETTAGE OF UTERUS     FINGER SURGERY  04/2011   Cyst removal, right hand forefinger   LEFT OOPHORECTOMY  1957   SQUAMOUS CELL CARCINOMA EXCISION Right  07/24/2018   Posterior forearm shave & ED&C    Social History:  reports that she quit smoking about 46 years ago. Her smoking use included cigarettes. She has a 30.00 pack-year smoking history. She has never used smokeless tobacco. She reports that she does not drink alcohol and does not use drugs.  Allergies: Allergies  Allergen Reactions   Diltiazem Hcl    Tape Other (See Comments)    It pulls the skin off.   Tramadol Hcl     Family History:  Family History  Problem Relation Age of Onset   Heart disease Brother 27       MI   Heart disease Sister        stents   Neuropathy Sister    Hypertension Sister    Stroke Mother    Heart disease Father    Heart attack Father    Stroke Maternal Grandfather    Heart attack Paternal Grandmother    Heart attack Paternal Grandfather    Breast cancer Maternal Aunt        x 2 aunts   Irritable bowel syndrome Brother    Heart attack Brother    Epilepsy Daughter    Heart attack Paternal Aunt        x  9   Colon cancer Neg Hx      Current Outpatient Medications:    aspirin 81 MG tablet, Take 81 mg by mouth daily., Disp: , Rfl:    cholecalciferol (VITAMIN D) 1000 UNITS tablet, Take 2,000 Units by mouth daily., Disp: , Rfl:    Coenzyme Q10 (CO Q10) 100 MG CAPS, Take 200 mg by mouth daily., Disp: , Rfl:    Cyanocobalamin (VITAMIN B 12 PO), Take 500 mg by mouth daily., Disp: , Rfl:    meloxicam (MOBIC) 7.5 MG tablet, Take 1 tablet (7.5 mg total) by mouth daily., Disp: 30 tablet, Rfl: 0   metoprolol succinate (TOPROL-XL) 25 MG 24 hr tablet, TAKE 1/2 TABLET EVERY DAY NEED APPOINTMENT, Disp: 45 tablet, Rfl: 0   Multiple Vitamins-Minerals (CENTRUM SILVER ULTRA WOMENS) TABS, Take 1 tablet by mouth daily., Disp: , Rfl:    Probiotic Product (PROBIOTIC-10 PO), Take by mouth., Disp: , Rfl:    rosuvastatin (CRESTOR) 10 MG tablet, TAKE 1 TABLET EVERY DAY. MUST ATTEND UPCOMING APPOINTMENT FOR FUTURE REFILLS, Disp: 90 tablet, Rfl: 3   saccharomyces  boulardii (FLORASTOR) 250 MG capsule, Take 1 capsule (250 mg total) by mouth 2 (two) times daily., Disp: 180 capsule, Rfl: 1   ezetimibe (ZETIA) 10 MG tablet, TAKE 1 TABLET EVERY DAY, Disp: 90 tablet, Rfl: 1  Review of Systems:  Constitutional: Denies fever, chills, diaphoresis, appetite change and fatigue.  HEENT: Denies photophobia, eye pain, redness, hearing loss, ear pain, congestion, sore throat, rhinorrhea, sneezing, mouth sores, trouble swallowing, neck pain, neck stiffness and tinnitus.   Respiratory: Denies SOB, DOE, cough, chest tightness,  and wheezing.   Cardiovascular: Denies chest pain, palpitations and leg swelling.  Gastrointestinal: Denies nausea, vomiting, abdominal pain, diarrhea, constipation, blood in stool and abdominal distention.  Genitourinary: Denies dysuria, urgency, frequency, hematuria, flank pain and difficulty urinating.  Endocrine: Denies: hot or cold intolerance, sweats, changes in hair or nails, polyuria, polydipsia. Musculoskeletal: Denies  joint swelling, arthralgias.  Skin: Denies pallor, rash and wound.  Neurological: Denies dizziness, seizures, syncope, weakness, light-headedness, numbness and headaches.  Hematological: Denies adenopathy. Easy bruising, personal or family bleeding history  Psychiatric/Behavioral: Denies suicidal ideation, mood changes, confusion, nervousness, sleep disturbance and agitation    Physical Exam: Vitals:   04/12/22 1121  BP: 120/60  Pulse: 60  Temp: 98.2 F (36.8 C)  TempSrc: Oral  SpO2: 98%  Weight: 127 lb 6.4 oz (57.8 kg)  Height: '5\' 4"'$  (1.626 m)    Body mass index is 21.87 kg/m.   Constitutional: NAD, calm, comfortable Eyes: PERRL, lids and conjunctivae normal ENMT: Mucous membranes are moist.  Psychiatric: Normal judgment and insight. Alert and oriented x 3. Normal mood.    Impression and Plan:  Right sided sciatica  Plan: meloxicam (MOBIC) 7.5 MG tablet, Ambulatory referral to Physical Therapy,  methylPREDNISolone acetate (DEPO-MEDROL) injection 40 mg, DISCONTINUED: methylPREDNISolone acetate (DEPO-MEDROL) injection 40 mg -This appears to be classic lower back pain with right-sided sciatica. -I will initiate referral to physical therapy, she will get 40 mg of IM Depo-Medrol in office today, meloxicam has also been prescribed.  Hyperlipidemia  - Plan: ezetimibe (ZETIA) 10 MG tablet    Time spent:30 minutes reviewing chart, interviewing and examining patient and formulating plan of care.    Lelon Frohlich, MD [facility]

## 2022-04-20 ENCOUNTER — Telehealth: Payer: Self-pay | Admitting: Internal Medicine

## 2022-04-20 NOTE — Telephone Encounter (Signed)
Pt was prescribed meloxicam (MOBIC) 7.5 MG tablet but states she is in extreme pain and really needs something stronger.  Pt states she does not normally take pain medication, but cannot bare the pain any longer.  Please advise.  [place], [place] - [address] Phone:  [phone]  Fax:  [phone]

## 2022-04-23 DIAGNOSIS — H524 Presbyopia: Secondary | ICD-10-CM | POA: Diagnosis not present

## 2022-04-23 DIAGNOSIS — H353131 Nonexudative age-related macular degeneration, bilateral, early dry stage: Secondary | ICD-10-CM | POA: Diagnosis not present

## 2022-04-23 DIAGNOSIS — H04123 Dry eye syndrome of bilateral lacrimal glands: Secondary | ICD-10-CM | POA: Diagnosis not present

## 2022-04-23 DIAGNOSIS — Z961 Presence of intraocular lens: Secondary | ICD-10-CM | POA: Diagnosis not present

## 2022-04-23 DIAGNOSIS — H52203 Unspecified astigmatism, bilateral: Secondary | ICD-10-CM | POA: Diagnosis not present

## 2022-04-23 NOTE — Telephone Encounter (Signed)
Spoke with the patient and informed her of the message below.  Patient is aware the message will be sent to Dr Jerilee Hoh for review when she returns to the office on Wednesday-7/19.

## 2022-04-24 ENCOUNTER — Encounter: Payer: Self-pay | Admitting: Rehabilitative and Restorative Service Providers"

## 2022-04-24 ENCOUNTER — Ambulatory Visit: Payer: Medicare HMO | Attending: Internal Medicine | Admitting: Rehabilitative and Restorative Service Providers"

## 2022-04-24 ENCOUNTER — Other Ambulatory Visit: Payer: Self-pay

## 2022-04-24 DIAGNOSIS — R252 Cramp and spasm: Secondary | ICD-10-CM

## 2022-04-24 DIAGNOSIS — M6281 Muscle weakness (generalized): Secondary | ICD-10-CM

## 2022-04-24 DIAGNOSIS — M5431 Sciatica, right side: Secondary | ICD-10-CM | POA: Diagnosis not present

## 2022-04-24 DIAGNOSIS — R2689 Other abnormalities of gait and mobility: Secondary | ICD-10-CM

## 2022-04-24 NOTE — Therapy (Signed)
OUTPATIENT PHYSICAL THERAPY THORACOLUMBAR EVALUATION   Patient Name: Rachel Nichols MRN: [id number] DOB:October 03, 1931, 88 y.o., female Today's Date: 04/24/2022   PT End of Session - 04/24/22 1237     Visit Number 1    Date for PT Re-Evaluation 06/15/22    Authorization Type [employer]    Progress Note Due on Visit 10    PT Start Time 33    PT Stop Time 1310    PT Time Calculation (min) 40 min    Activity Tolerance Patient limited by pain    Behavior During Therapy [facility] for tasks assessed/performed             Past Medical History:  Diagnosis Date   Adenomatous colon polyp 2002   Colonoscopy    Benign neoplasm of colon 2004   Colonoscopy   CAD (coronary artery disease)    echo 09/13/10- EF>55%, aortic valve is mildly sclerotic; myoview 02-20-12-no ischemia   Carotid bruit    carotid dopplers 05/16/04- normal   Diverticulosis 2002,2004,2007   Colonoscopy   Duodenitis    Fibrocystic breast disease    Gastritis    GERD (gastroesophageal reflux disease)    Heart attack ([place]) 1993   Cardiolyte stress---  stents 2012   Hiatal hernia    History of blood transfusion    pt denies   Hyperlipidemia    Peptic ulcer due to Helicobacter pylori    PUD (peptic ulcer disease)    SCC (squamous cell carcinoma) 04/24/2011   right jawline (CX35FU), left forearm (CX35FU)   Past Surgical History:  Procedure Laterality Date   [place]     From the nose   CATARACT EXTRACTION  10/26/09   Right eye   CATARACT EXTRACTION  11/09/09   Left eye   CHOLECYSTECTOMY  1984   CORONARY STENT PLACEMENT  01/10/11   hig grade ramus branch stenosis with mod LAD dz, PCI/stenting of ramus branch with a Resolute DES    DILATION AND CURETTAGE OF UTERUS     FINGER SURGERY  04/2011   Cyst removal, right hand forefinger   LEFT OOPHORECTOMY  1957   SQUAMOUS CELL CARCINOMA EXCISION Right 07/24/2018   Posterior forearm shave & ED&C   Patient Active  Problem List   Diagnosis Date Noted   IGT (impaired glucose tolerance) 01/12/2021   Hyperlipidemia LDL goal <70 01/13/2015   Murmur 01/13/2015   RUQ abdominal pain 01/22/2014   Pancreatic cyst 01/22/2014   Essential hypertension 03/06/2013   CAD S/P percutaneous coronary angioplasty 04/14/2012   GASTRITIS 10/19/2009   ABDOMINAL PAIN-EPIGASTRIC 10/18/2009   CHOLECYSTECTOMY, HX OF 10/18/2009   DIVERTICULOSIS, COLON 10/14/2009   COLONIC POLYPS, ADENOMATOUS, HX OF 10/14/2009   HIATAL HERNIA WITH REFLUX 09/19/2009   WEIGHT LOSS 09/19/2009   PUD, HX OF 09/19/2009   BACK PAIN 05/27/2008    PCP: Isaac Bliss, Rayford Halsted, MD   REFERRING PROVIDER: Isaac Bliss, Rayford Halsted, MD   REFERRING DIAG: M54.31 (ICD-10-CM) - Right sided sciatica   Rationale for Evaluation and Treatment Rehabilitation  THERAPY DIAG:  Muscle weakness (generalized) - Plan: PT plan of care cert/re-cert  Cramp and spasm - Plan: PT plan of care cert/re-cert  Other abnormalities of gait and mobility - Plan: PT plan of care cert/re-cert  ONSET DATE: 27/25/3664 referral written  SUBJECTIVE:  SUBJECTIVE STATEMENT: Pt reports that approx 2 weeks ago, she started having pain shooting down her right leg.  The pain has only worsened in severity since it initially began.  Pt reports no relief with Meloxicam or Tylenol.  PERTINENT HISTORY:  Gastritis, GERD, Squamous Cell Carcinoma  PAIN:  Are you having pain? Yes: NPRS scale: 10/10 Pain location: radiating down right leg Pain description: sharp Aggravating factors: standing Relieving factors: sitting   PRECAUTIONS: None  WEIGHT BEARING RESTRICTIONS No  FALLS:  Has patient fallen in last 6 months? 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  POSTURE: rounded shoulders and forward head  PALPATION: Muscle spasms noted in lumbar multifidi  LUMBAR ROM: Limited at least 50% secondary to pain   LOWER EXTREMITY MMT:    MMT Right eval Left eval  Hip flexion 4- 5  Hip extension 4- 5  Hip abduction 4- 5  Hip adduction    Hip internal rotation    Hip external rotation    Knee flexion 5 5  Knee extension 5 5  Ankle dorsiflexion    Ankle plantarflexion    Ankle inversion    Ankle eversion     (Blank rows = not tested)  LUMBAR SPECIAL TESTS:  Slump test: Positive  FUNCTIONAL TESTS:  5 times sit to stand: 16.2 sec pushing up from thighs with 10/10 pain  GAIT: Distance walked: 75 ft Assistive device utilized: None Level of assistance: Complete Independence Comments: Antalgic gait pattern noted with decreased step length    TODAY'S TREATMENT  04/24/2022:  Reviewed HEP (see below) Seated hamstring stretch x20 sec RLE Seated sciatic tensioner (slump test) to RLE x3 Seated LAQ with sciatic nerve floss  x10   PATIENT EDUCATION:  Education details: Issued HEP Person educated: Patient Education method: Explanation, Media planner, and Handouts Education comprehension: verbalized understanding and returned demonstration   HOME EXERCISE PROGRAM: Access Code: [facility] URL: [email or web address] Date: 04/24/2022 Prepared by: Shelby Dubin Maresa Morash  Exercises - Seated Hamstring Stretch with Chair  - 1 x daily - 7 x weekly - 1 sets - 3 reps - 30 sec hold - Seated Sciatic Tensioner  - 1 x daily - 7 x weekly - 1 sets - 10 reps - 5 sec hold - Seated Long Arc Quad  - 1 x daily - 7 x weekly - 2 sets - 10 reps  ASSESSMENT:  CLINICAL IMPRESSION: Patient is a 88 y.o. female who was seen today for physical therapy evaluation and treatment for right sided sciatica. Pt reports that her PLOF is able to  care for her husband who has dementia (including sometimes having to pull him out of the chair) without pain.  Pt reports approximately 2 weeks history of pain radiating down right leg.  Pt presents with right hip weakness, RLE pain, and antalgic gait.  Pt would benefit from skilled PT to progress towards her ability to return to her pain-free lifestyle.   OBJECTIVE IMPAIRMENTS difficulty walking, decreased strength, increased muscle spasms, and pain.   ACTIVITY LIMITATIONS lifting, standing, and locomotion level  PARTICIPATION LIMITATIONS: cleaning, driving, and shopping  PERSONAL FACTORS Age and 1-2 comorbidities: GERD, Squamous Cell Carcinoma  are also affecting patient's functional outcome.   REHAB POTENTIAL: Good  CLINICAL DECISION MAKING: Evolving/moderate complexity  EVALUATION COMPLEXITY: Moderate   GOALS: Goals reviewed with patient? Yes  SHORT TERM GOALS: Target date: 05/15/2022  Pt will be independent with initial HEP. Baseline: Goal status: INITIAL  2.  Pt will report at least a 40% improvement in symptoms. Baseline:  Goal status: INITIAL   LONG TERM GOALS: Target  date: 06/19/2022  Pt will be independent with advanced HEP. Baseline:  Goal status: INITIAL  2.  Pt will improve modified oswestry to 30% or less to demonstrate improvements with functional mobility. Baseline: 60% Goal status: INITIAL  3.  Pt will increase right hip strength to at least 4+/5 to improve ability to transfer her husband. Baseline:  Goal status: INITIAL  4.  Pt will report no increased pain when caring for her husband, including transferring her husband. Baseline:  Goal status: INITIAL   PLAN: PT FREQUENCY: 2x/week  PT DURATION: 8 weeks  PLANNED INTERVENTIONS: Therapeutic exercises, Therapeutic activity, Neuromuscular re-education, Balance training, Gait training, Patient/Family education, Self Care, Joint mobilization, Joint manipulation, Stair training, Aquatic Therapy, Dry Needling, Electrical stimulation, Cryotherapy, Moist heat, Taping, Ultrasound, Ionotophoresis '4mg'$ /ml Dexamethasone, Manual therapy, and Re-evaluation.  PLAN FOR NEXT SESSION: assess and progress HEP as indicated, strengthening, flexibility, core stability   Juel Burrow, PT 04/24/2022, 1:34 PM   [facility] [address] [place], [place] [address] Phone # [phone] Fax [phone]

## 2022-04-25 ENCOUNTER — Other Ambulatory Visit: Payer: Self-pay | Admitting: Internal Medicine

## 2022-04-25 DIAGNOSIS — M5431 Sciatica, right side: Secondary | ICD-10-CM

## 2022-04-25 MED ORDER — MELOXICAM 7.5 MG PO TABS: 7.5000 mg | ORAL_TABLET | Freq: Every day | ORAL | 0 refills | Status: DC

## 2022-04-25 MED ORDER — PREDNISONE 10 MG (21) PO TBPK: ORAL_TABLET | ORAL | 0 refills | Status: DC

## 2022-04-25 NOTE — Telephone Encounter (Signed)
Patient would like to try prednisone. [facility] [address]

## 2022-04-25 NOTE — Telephone Encounter (Signed)
Patient states that she started PT 04/24/22 and that meloxicam is not helping.  Please advise.

## 2022-04-26 NOTE — Telephone Encounter (Signed)
Rx sent 

## 2022-04-30 ENCOUNTER — Other Ambulatory Visit: Payer: Self-pay

## 2022-04-30 ENCOUNTER — Encounter (HOSPITAL_BASED_OUTPATIENT_CLINIC_OR_DEPARTMENT_OTHER): Payer: Self-pay | Admitting: Emergency Medicine

## 2022-04-30 ENCOUNTER — Emergency Department (HOSPITAL_BASED_OUTPATIENT_CLINIC_OR_DEPARTMENT_OTHER)
Admission: EM | Admit: 2022-04-30 | Discharge: 2022-04-30 | Disposition: A | Discharge status: Home / Self Care | Payer: Medicare HMO | Attending: Emergency Medicine | Admitting: Emergency Medicine

## 2022-04-30 ENCOUNTER — Emergency Department (HOSPITAL_BASED_OUTPATIENT_CLINIC_OR_DEPARTMENT_OTHER): Payer: Medicare HMO

## 2022-04-30 DIAGNOSIS — Z7982 Long term (current) use of aspirin: Secondary | ICD-10-CM | POA: Diagnosis not present

## 2022-04-30 DIAGNOSIS — R9431 Abnormal electrocardiogram [ECG] [EKG]: Secondary | ICD-10-CM | POA: Diagnosis not present

## 2022-04-30 DIAGNOSIS — Z79899 Other long term (current) drug therapy: Secondary | ICD-10-CM | POA: Diagnosis not present

## 2022-04-30 DIAGNOSIS — I2510 Atherosclerotic heart disease of native coronary artery without angina pectoris: Secondary | ICD-10-CM | POA: Insufficient documentation

## 2022-04-30 DIAGNOSIS — K2900 Acute gastritis without bleeding: Secondary | ICD-10-CM | POA: Diagnosis not present

## 2022-04-30 DIAGNOSIS — I10 Essential (primary) hypertension: Secondary | ICD-10-CM | POA: Diagnosis not present

## 2022-04-30 DIAGNOSIS — K862 Cyst of pancreas: Secondary | ICD-10-CM | POA: Diagnosis not present

## 2022-04-30 DIAGNOSIS — N3000 Acute cystitis without hematuria: Secondary | ICD-10-CM | POA: Diagnosis not present

## 2022-04-30 DIAGNOSIS — K760 Fatty (change of) liver, not elsewhere classified: Secondary | ICD-10-CM | POA: Diagnosis not present

## 2022-04-30 DIAGNOSIS — K7689 Other specified diseases of liver: Secondary | ICD-10-CM | POA: Diagnosis not present

## 2022-04-30 DIAGNOSIS — K5730 Diverticulosis of large intestine without perforation or abscess without bleeding: Secondary | ICD-10-CM | POA: Diagnosis not present

## 2022-04-30 DIAGNOSIS — R1013 Epigastric pain: Secondary | ICD-10-CM | POA: Diagnosis present

## 2022-04-30 LAB — URINALYSIS, ROUTINE W REFLEX MICROSCOPIC
Bilirubin Urine: NEGATIVE
Glucose, UA: NEGATIVE mg/dL
Ketones, ur: NEGATIVE mg/dL
Nitrite: NEGATIVE
Protein, ur: 30 mg/dL — AB
Specific Gravity, Urine: 1.032 — ABNORMAL HIGH (ref 1.005–1.030)
pH: 6 (ref 5.0–8.0)

## 2022-04-30 LAB — CBC
HCT: 41.2 % (ref 36.0–46.0)
Hemoglobin: 13.7 g/dL (ref 12.0–15.0)
MCH: 31.4 pg (ref 26.0–34.0)
MCHC: 33.3 g/dL (ref 30.0–36.0)
MCV: 94.5 fL (ref 80.0–100.0)
Platelets: 210 10*3/uL (ref 150–400)
RBC: 4.36 MIL/uL (ref 3.87–5.11)
RDW: 13.3 % (ref 11.5–15.5)
WBC: 10 10*3/uL (ref 4.0–10.5)
nRBC: 0 % (ref 0.0–0.2)

## 2022-04-30 LAB — COMPREHENSIVE METABOLIC PANEL
ALT: 55 U/L — ABNORMAL HIGH (ref 0–44)
AST: 36 U/L (ref 15–41)
Albumin: 4.7 g/dL (ref 3.5–5.0)
Alkaline Phosphatase: 38 U/L (ref 38–126)
Anion gap: 9 (ref 5–15)
BUN: 26 mg/dL — ABNORMAL HIGH (ref 8–23)
CO2: 27 mmol/L (ref 22–32)
Calcium: 10.4 mg/dL — ABNORMAL HIGH (ref 8.9–10.3)
Chloride: 100 mmol/L (ref 98–111)
Creatinine, Ser: 0.83 mg/dL (ref 0.44–1.00)
GFR, Estimated: >60 mL/min (ref >60)
Glucose, Bld: 146 mg/dL — ABNORMAL HIGH (ref 70–99)
Potassium: 4.3 mmol/L (ref 3.5–5.1)
Sodium: 136 mmol/L (ref 135–145)
Total Bilirubin: 0.5 mg/dL (ref 0.3–1.2)
Total Protein: 7.3 g/dL (ref 6.5–8.1)

## 2022-04-30 LAB — LIPASE, BLOOD: Lipase: 24 U/L (ref 11–51)

## 2022-04-30 MED ORDER — ONDANSETRON HCL 4 MG/2ML IJ SOLN
4.0000 mg | Freq: Once | INTRAMUSCULAR | Status: AC
Start: 2022-04-30 — End: 2022-04-30
  Administered 2022-04-30: 4 mg via INTRAVENOUS
  Filled 2022-04-30: qty 2

## 2022-04-30 MED ORDER — PANTOPRAZOLE SODIUM 40 MG IV SOLR
40.0000 mg | Freq: Once | INTRAVENOUS | Status: AC
  Administered 2022-04-30: 40 mg via INTRAVENOUS
  Filled 2022-04-30: qty 10

## 2022-04-30 MED ORDER — SUCRALFATE 1 GM/10ML PO SUSP: 1.0000 g | Freq: Three times a day (TID) | ORAL | 0 refills | Status: DC

## 2022-04-30 MED ORDER — OMEPRAZOLE 20 MG PO CPDR: 20.0000 mg | DELAYED_RELEASE_CAPSULE | Freq: Every day | ORAL | 0 refills | Status: DC

## 2022-04-30 MED ORDER — ALUM & MAG HYDROXIDE-SIMETH 200-200-20 MG/5ML PO SUSP
30.0000 mL | Freq: Once | ORAL | Status: AC
  Administered 2022-04-30: 30 mL via ORAL
  Filled 2022-04-30: qty 30

## 2022-04-30 MED ORDER — IOHEXOL 300 MG/ML  SOLN
100.0000 mL | Freq: Once | INTRAMUSCULAR | Status: AC | PRN
  Administered 2022-04-30: 75 mL via INTRAVENOUS

## 2022-04-30 MED ORDER — CEPHALEXIN 500 MG PO CAPS: 500.0000 mg | ORAL_CAPSULE | Freq: Two times a day (BID) | ORAL | 0 refills | Status: DC

## 2022-04-30 MED ORDER — ONDANSETRON 4 MG PO TBDP
4.0000 mg | ORAL_TABLET | Freq: Once | ORAL | Status: DC | PRN
  Filled 2022-04-30: qty 1

## 2022-04-30 MED ORDER — SODIUM CHLORIDE 0.9 % IV BOLUS
500.0000 mL | Freq: Once | INTRAVENOUS | Status: AC
Start: 2022-04-30 — End: 2022-04-30
  Administered 2022-04-30: 500 mL via INTRAVENOUS

## 2022-04-30 NOTE — Discharge Instructions (Addendum)
Stop taking the prednisone and meloxicam.  You can take Tylenol for your pain.  Follow-up with your primary care doctor within the next few days for recheck.  Return to the emergency room if you have any worsening symptoms.  Your urine appeared infected.  You were given a prescription for antibiotics for this.

## 2022-04-30 NOTE — ED Provider Notes (Addendum)
[place] EMERGENCY DEPT Provider Note   CSN: [id number] Arrival date & time: 04/30/22  1748     History  Chief Complaint  Patient presents with   Leg Pain   Abdominal Pain    TASHE PURDON is a 88 y.o. female.  Patient is a 88 year old female with a history of coronary artery disease, hypertension hyperlipidemia who presents with abdominal pain.  She says that she has recently had a flareup of her right-sided sciatica.  She was initially started on meloxicam and then prednisone.  She is taking the prednisone for 4 days so far.  She says that since she started the prednisone she has been having some worsening pain in her epigastric area.  She has some associated nausea and nonbloody, nonbilious vomiting.  No change in her stools.  No urinary symptoms.  No fevers.  No history of known GERD.  She does say that her sciatica has improved greatly.       Home Medications Prior to Admission medications   Medication Sig Start Date End Date Taking? Authorizing Provider  cephALEXin (KEFLEX) 500 MG capsule Take 1 capsule (500 mg total) by mouth 2 (two) times daily. 04/30/22  Yes Malvin Johns, MD  omeprazole (PRILOSEC) 20 MG capsule Take 1 capsule (20 mg total) by mouth daily. 04/30/22  Yes Malvin Johns, MD  sucralfate (CARAFATE) 1 GM/10ML suspension Take 10 mLs (1 g total) by mouth 4 (four) times daily -  with meals and at bedtime. 04/30/22  Yes Malvin Johns, MD  aspirin 81 MG tablet Take 81 mg by mouth daily.    [provider]  cholecalciferol (VITAMIN D) 1000 UNITS tablet Take 2,000 Units by mouth daily.    [provider]  Coenzyme Q10 (CO Q10) 100 MG CAPS Take 200 mg by mouth daily.    [provider]  Cyanocobalamin (VITAMIN B 12 PO) Take 500 mg by mouth daily.    [provider]  ezetimibe (ZETIA) 10 MG tablet TAKE 1 TABLET EVERY DAY 04/12/22   Isaac Bliss, Rayford Halsted, MD  meloxicam (MOBIC) 7.5 MG tablet Take 1 tablet (7.5 mg total)  by mouth daily. 04/25/22   Isaac Bliss, Rayford Halsted, MD  metoprolol succinate (TOPROL-XL) 25 MG 24 hr tablet TAKE 1/2 TABLET EVERY DAY NEED APPOINTMENT 04/04/21   Troy Sine, MD  Multiple Vitamins-Minerals (CENTRUM SILVER ULTRA WOMENS) TABS Take 1 tablet by mouth daily.    [provider]  predniSONE (STERAPRED UNI-PAK 21 TAB) 10 MG (21) TBPK tablet Take as directed. 04/25/22   Isaac Bliss, Rayford Halsted, MD  Probiotic Product (PROBIOTIC-10 PO) Take by mouth.    [provider]  rosuvastatin (CRESTOR) 10 MG tablet TAKE 1 TABLET EVERY DAY. MUST ATTEND UPCOMING APPOINTMENT FOR FUTURE REFILLS 07/05/21   Troy Sine, MD  saccharomyces boulardii (FLORASTOR) 250 MG capsule Take 1 capsule (250 mg total) by mouth 2 (two) times daily. 01/06/20   Isaac Bliss, Rayford Halsted, MD      Allergies    Diltiazem hcl, Tape, and Tramadol hcl    Review of Systems   Review of Systems  Constitutional:  Negative for chills, diaphoresis, fatigue and fever.  HENT:  Negative for congestion, rhinorrhea and sneezing.   Eyes: Negative.   Respiratory:  Negative for cough, chest tightness and shortness of breath.   Cardiovascular:  Negative for chest pain and leg swelling.  Gastrointestinal:  Positive for abdominal pain, nausea and vomiting. Negative for blood in stool and diarrhea.  Genitourinary:  Negative for difficulty urinating, flank pain, frequency and hematuria.  Musculoskeletal:  Negative for arthralgias and back pain.  Skin:  Negative for rash.  Neurological:  Negative for dizziness, speech difficulty, weakness, numbness and headaches.    Physical Exam Updated Vital Signs BP (!) 153/64 (BP Location: Right Arm)   Pulse (!) 56   Temp 98.7 F (37.1 C) (Oral)   Resp 13   Ht '5\' 4"'$  (1.626 m)   Wt 57.6 kg   SpO2 96%   BMI 21.80 kg/m  Physical Exam Constitutional:      Appearance: She is well-developed.  HENT:     Head: Normocephalic and atraumatic.  Eyes:     Pupils: Pupils are  equal, round, and reactive to light.  Cardiovascular:     Rate and Rhythm: Normal rate and regular rhythm.     Heart sounds: Normal heart sounds.  Pulmonary:     Effort: Pulmonary effort is normal. No respiratory distress.     Breath sounds: Normal breath sounds. No wheezing or rales.  Chest:     Chest wall: No tenderness.  Abdominal:     General: Bowel sounds are normal.     Palpations: Abdomen is soft.     Tenderness: There is abdominal tenderness in the epigastric area. There is no guarding or rebound.  Musculoskeletal:        General: Normal range of motion.     Cervical back: Normal range of motion and neck supple.  Lymphadenopathy:     Cervical: No cervical adenopathy.  Skin:    General: Skin is warm and dry.     Findings: No rash.  Neurological:     Mental Status: She is alert and oriented to person, place, and time.     ED Results / Procedures / Treatments   Labs (all labs ordered are listed, but only abnormal results are displayed) Labs Reviewed  COMPREHENSIVE METABOLIC PANEL - Abnormal; Notable for the following components:      Result Value   Glucose, Bld 146 (*)    BUN 26 (*)    Calcium 10.4 (*)    ALT 55 (*)    All other components within normal limits  URINALYSIS, ROUTINE W REFLEX MICROSCOPIC - Abnormal; Notable for the following components:   Specific Gravity, Urine 1.032 (*)    Hgb urine dipstick SMALL (*)    Protein, ur 30 (*)    Leukocytes,Ua LARGE (*)    All other components within normal limits  LIPASE, BLOOD  CBC    EKG EKG Interpretation  Date/Time:  Monday April 30 2022 18:10:53 EDT Ventricular Rate:  63 PR Interval:  138 QRS Duration: 76 QT Interval:  424 QTC Calculation: 433 R Axis:   -18 Text Interpretation: Normal sinus rhythm Nonspecific ST abnormality Abnormal ECG When compared with ECG of 30-Aug-2020 01:28, PREVIOUS ECG IS PRESENT since last tracing no significant change Confirmed by Malvin Johns [phone]) on 04/30/2022 6:59:49  PM  Radiology CT Abdomen Pelvis W Contrast  Result Date: 04/30/2022 CLINICAL DATA:  Abdominal pain. EXAM: CT ABDOMEN AND PELVIS WITH CONTRAST TECHNIQUE: Multidetector CT imaging of the abdomen and pelvis was performed using the standard protocol following bolus administration of intravenous contrast. RADIATION DOSE REDUCTION: This exam was performed according to the departmental dose-optimization program which includes automated exposure control, adjustment of the mA and/or kV according to patient size and/or use of iterative reconstruction technique. CONTRAST:  56m OMNIPAQUE IOHEXOL 300 MG/ML  SOLN COMPARISON:  January 13, 2014  FINDINGS: Lower chest: No acute abnormality. Hepatobiliary: There is diffuse fatty infiltration of the liver parenchyma. Multiple stable simple hepatic cysts are seen. Status post cholecystectomy. The common bile duct measures 1.4 cm in diameter. Pancreas: A stable 4.1 cm x 3.1 cm simple cyst is seen along the tip of the tail of the pancreas. No pancreatic ductal dilatation or surrounding inflammatory changes. Spleen: Normal in size without focal abnormality. Adrenals/Urinary Tract: Adrenal glands are unremarkable. Kidneys are normal, without renal calculi, focal lesion, or hydronephrosis. Bladder is unremarkable. Stomach/Bowel: Stomach is within normal limits. The appendix is not clearly identified. No evidence of bowel wall thickening, distention, or inflammatory changes. Noninflamed diverticula are seen throughout the sigmoid colon. Vascular/Lymphatic: Aortic atherosclerosis. No enlarged abdominal or pelvic lymph nodes. Reproductive: Uterus and bilateral adnexa are unremarkable. Other: No abdominal wall hernia or abnormality. No abdominopelvic ascites. Musculoskeletal: A chronic compression fracture deformity is seen at the level of L1. Multilevel degenerative changes are also seen throughout the lumbar spine. IMPRESSION: 1. Hepatic steatosis with multiple stable simple hepatic cysts.  2. Stable 4.1 cm x 3.1 cm simple cyst along the tip of the tail of the pancreas. 3. Sigmoid diverticulosis. 4. Chronic compression fracture deformity at the level of L1. 5. Aortic atherosclerosis. Aortic Atherosclerosis (ICD10-I70.0). Electronically Signed   By: Virgina Norfolk M.D.   On: 04/30/2022 20:36    Procedures Procedures    Medications Ordered in ED Medications  ondansetron (ZOFRAN-ODT) disintegrating tablet 4 mg (has no administration in time range)  pantoprazole (PROTONIX) injection 40 mg (40 mg Intravenous Given 04/30/22 1958)  ondansetron (ZOFRAN) injection 4 mg (4 mg Intravenous Given 04/30/22 1958)  sodium chloride 0.9 % bolus 500 mL (0 mLs Intravenous Stopped 04/30/22 2102)  iohexol (OMNIPAQUE) 300 MG/ML solution 100 mL (75 mLs Intravenous Contrast Given 04/30/22 2013)  alum & mag hydroxide-simeth (MAALOX/MYLANTA) 200-200-20 MG/5ML suspension 30 mL (30 mLs Oral Given 04/30/22 2102)    ED Course/ Medical Decision Making/ A&P                           Medical Decision Making Amount and/or Complexity of Data Reviewed Labs: ordered. Radiology: ordered.  Risk OTC drugs. Prescription drug management.   Patient is a 88 year old female who presents with abdominal pain after she recently started meloxicam and prednisone.  Her labs are nonconcerning.  There is no evidence of pancreatitis.  Her hemoglobin is okay.  She does not have any hematemesis or melena.  CT scan was performed which shows no acute abnormality.  She was given Protonix in her IV as well as GI cocktail.  She is feeling much better after this.  Her pain is greatly improved.  I suspect she has some gastritis from the medications.  No suggestions of perforation on imaging.  She was advised to go ahead and stop taking the prednisone.  She is already taken a 4-day course.  Her sciatica pain is much improved.  She will also stop taking the meloxicam and switch to Tylenol.  She was encouraged to follow-up with her primary  care doctor within the next few days.  Return precautions were given.  Her urine does appear to be consistent with infection and she was started on Keflex for this.  Final Clinical Impression(s) / ED Diagnoses Final diagnoses:  Acute gastritis without hemorrhage, unspecified gastritis type  Acute cystitis without hematuria    Rx / DC Orders ED Discharge Orders  Ordered    sucralfate (CARAFATE) 1 GM/10ML suspension  3 times daily with meals & bedtime        04/30/22 2123    omeprazole (PRILOSEC) 20 MG capsule  Daily        04/30/22 2123    cephALEXin (KEFLEX) 500 MG capsule  2 times daily        04/30/22 2127              Malvin Johns, MD 04/30/22 2125    Malvin Johns, MD 04/30/22 2204

## 2022-04-30 NOTE — ED Triage Notes (Signed)
Pt POV c/o abd pain x2 days, emesis x2 days. Denies diarrhea. Tolerating PO intake today.   Recently started taking prednisone x3 days for right sciatica pain.

## 2022-05-01 ENCOUNTER — Ambulatory Visit: Payer: Medicare HMO

## 2022-05-01 DIAGNOSIS — M6281 Muscle weakness (generalized): Secondary | ICD-10-CM

## 2022-05-01 DIAGNOSIS — R252 Cramp and spasm: Secondary | ICD-10-CM

## 2022-05-01 DIAGNOSIS — R262 Difficulty in walking, not elsewhere classified: Secondary | ICD-10-CM

## 2022-05-01 DIAGNOSIS — M5431 Sciatica, right side: Secondary | ICD-10-CM | POA: Diagnosis not present

## 2022-05-01 DIAGNOSIS — R2689 Other abnormalities of gait and mobility: Secondary | ICD-10-CM

## 2022-05-01 NOTE — Therapy (Signed)
OUTPATIENT PHYSICAL THERAPY TREATMENT NOTE   Patient Name: Rachel Nichols MRN: [id number] DOB:12-16-1930, 88 y.o., female Today's Date: 05/01/2022  PCP: Isaac Bliss, Rayford Halsted, MD REFERRING PROVIDER: Isaac Bliss, Rayford Halsted, MD  END OF SESSION:   PT End of Session - 05/01/22 1546     Visit Number 2    Date for PT Re-Evaluation 06/15/22    Authorization Type [employer]    PT Start Time 1545    PT Stop Time 1615    PT Time Calculation (min) 30 min    Activity Tolerance Patient limited by pain    Behavior During Therapy [facility] for tasks assessed/performed             Past Medical History:  Diagnosis Date   Adenomatous colon polyp 2002   Colonoscopy    Benign neoplasm of colon 2004   Colonoscopy   CAD (coronary artery disease)    echo 09/13/10- EF>55%, aortic valve is mildly sclerotic; myoview 02-20-12-no ischemia   Carotid bruit    carotid dopplers 05/16/04- normal   Diverticulosis 2002,2004,2007   Colonoscopy   Duodenitis    Fibrocystic breast disease    Gastritis    GERD (gastroesophageal reflux disease)    Heart attack ([place]) 1993   Cardiolyte stress---  stents 2012   Hiatal hernia    History of blood transfusion    pt denies   Hyperlipidemia    Peptic ulcer due to Helicobacter pylori    PUD (peptic ulcer disease)    SCC (squamous cell carcinoma) 04/24/2011   right jawline (CX35FU), left forearm (CX35FU)   Past Surgical History:  Procedure Laterality Date   [place]     From the nose   CATARACT EXTRACTION  10/26/09   Right eye   CATARACT EXTRACTION  11/09/09   Left eye   CHOLECYSTECTOMY  1984   CORONARY STENT PLACEMENT  01/10/11   hig grade ramus branch stenosis with mod LAD dz, PCI/stenting of ramus branch with a Resolute DES    DILATION AND CURETTAGE OF UTERUS     FINGER SURGERY  04/2011   Cyst removal, right hand forefinger   LEFT OOPHORECTOMY  1957   SQUAMOUS CELL CARCINOMA EXCISION  Right 07/24/2018   Posterior forearm shave & ED&C   Patient Active Problem List   Diagnosis Date Noted   IGT (impaired glucose tolerance) 01/12/2021   Hyperlipidemia LDL goal <70 01/13/2015   Murmur 01/13/2015   RUQ abdominal pain 01/22/2014   Pancreatic cyst 01/22/2014   Essential hypertension 03/06/2013   CAD S/P percutaneous coronary angioplasty 04/14/2012   GASTRITIS 10/19/2009   ABDOMINAL PAIN-EPIGASTRIC 10/18/2009   CHOLECYSTECTOMY, HX OF 10/18/2009   DIVERTICULOSIS, COLON 10/14/2009   COLONIC POLYPS, ADENOMATOUS, HX OF 10/14/2009   HIATAL HERNIA WITH REFLUX 09/19/2009   WEIGHT LOSS 09/19/2009   PUD, HX OF 09/19/2009   BACK PAIN 05/27/2008    REFERRING DIAG: M54.31 (ICD-10-CM) - Right sided sciatica  THERAPY DIAG:  Muscle weakness (generalized)  Cramp and spasm  Other abnormalities of gait and mobility  Difficulty in walking, not elsewhere classified  Rationale for Evaluation and Treatment Rehabilitation  PERTINENT HISTORY: None  PRECAUTIONS: none  SUBJECTIVE: Patient states she had to go to ER yesterday due to gastritis from meloxicam then changing over to prednisone.    PAIN:  Are you having pain? Yes: NPRS scale: 7/10 Pain location: low back Pain description: aching Aggravating factors: standing  Relieving factors: rest, meds   OBJECTIVE: (objective measures completed at initial evaluation unless otherwise dated)  OBJECTIVE:    DIAGNOSTIC FINDINGS:  N/A   PATIENT SURVEYS:  Modified Oswestry 30 / 50 = 60.0 %    SCREENING FOR RED FLAGS: Bowel or bladder incontinence: No Spinal tumors: No Cauda equina syndrome: No Compression fracture: No Abdominal aneurysm: No   COGNITION:           Overall cognitive status: Within functional limits for tasks assessed                          SENSATION: Reports numbness and tingling down right leg   MUSCLE LENGTH: Right hamstring tightness noted.   POSTURE: rounded shoulders and forward head    PALPATION: Muscle spasms noted in lumbar multifidi   LUMBAR ROM: Limited at least 50% secondary to pain     LOWER EXTREMITY MMT:     MMT Right eval Left eval  Hip flexion 4- 5  Hip extension 4- 5  Hip abduction 4- 5  Hip adduction      Hip internal rotation      Hip external rotation      Knee flexion 5 5  Knee extension 5 5  Ankle dorsiflexion      Ankle plantarflexion      Ankle inversion      Ankle eversion       (Blank rows = not tested)   LUMBAR SPECIAL TESTS:  Slump test: Positive   FUNCTIONAL TESTS:  5 times sit to stand: 16.2 sec pushing up from thighs with 10/10 pain   GAIT: Distance walked: 75 ft Assistive device utilized: None Level of assistance: Complete Independence Comments: Antalgic gait pattern noted with decreased step length       TODAY'S TREATMENT   05/01/2022:  Supine hamstring stretch x20 sec RLE Supine ITB/lateral hip Seated sciatic tensioner (slump test) to RLE x3 Seated LAQ with sciatic nerve floss x10 PPT PPT with 90/90 heel tap PPT with dying bug Hooklying trunk rotation x 20  TODAY'S TREATMENT  04/24/2022:  Reviewed HEP (see below) Seated hamstring stretch x20 sec RLE Seated sciatic tensioner (slump test) to RLE x3 Seated LAQ with sciatic nerve floss x10     PATIENT EDUCATION:  Education details: Issued HEP Person educated: Patient Education method: Explanation, Media planner, and Handouts Education comprehension: verbalized understanding and returned demonstration     HOME EXERCISE PROGRAM: Access Code: [facility] URL: [email or web address] Date: 05/01/2022 Prepared by: Candyce Churn  Exercises - Seated Hamstring Stretch with Chair  - 1 x daily - 7 x weekly - 1 sets - 3 reps - 30 sec hold - Seated Sciatic Tensioner  - 1 x daily - 7 x weekly - 1 sets - 10 reps - 5 sec hold - Seated Long Arc Quad  - 1 x daily - 7 x weekly - 2 sets - 10 reps - Supine Posterior Pelvic Tilt  - 1 x daily - 7 x weekly - 2  sets - 10 reps - Supine March  - 1 x daily - 7 x weekly - 2 sets - 10 reps - Supine Lower Trunk Rotation  - 1 x daily - 7 x weekly - 2 sets - 10 reps - Supine Dead Bug with Leg Extension  - 1 x daily - 7 x weekly - 2 sets - 10 repsAccess Code: [facility] URL: [email or web address] Date: 04/24/2022 Prepared by: Juel Burrow   Exercises - Seated  Hamstring Stretch with Chair  - 1 x daily - 7 x weekly - 1 sets - 3 reps - 30 sec hold - Seated Sciatic Tensioner  - 1 x daily - 7 x weekly - 1 sets - 10 reps - 5 sec hold - Seated Long Arc Quad  - 1 x daily - 7 x weekly - 2 sets - 10 reps   ASSESSMENT:   CLINICAL IMPRESSION: Patient was 15 min late today.  She was very apologetic.  We were able to do a good review of HEP and add some core strengthening.  She was able to complete all new exercises without increased pain.  She needed mod verbal cues on her HEP review.  She reported overall improvement.   Pt would benefit from skilled PT to progress towards her ability to return to her pain-free lifestyle.     OBJECTIVE IMPAIRMENTS difficulty walking, decreased strength, increased muscle spasms, and pain.    ACTIVITY LIMITATIONS lifting, standing, and locomotion level   PARTICIPATION LIMITATIONS: cleaning, driving, and shopping   PERSONAL FACTORS Age and 1-2 comorbidities: GERD, Squamous Cell Carcinoma  are also affecting patient's functional outcome.    REHAB POTENTIAL: Good   CLINICAL DECISION MAKING: Evolving/moderate complexity   EVALUATION COMPLEXITY: Moderate     GOALS: Goals reviewed with patient? Yes   SHORT TERM GOALS: Target date: 05/15/2022   Pt will be independent with initial HEP. Baseline: Goal status: INITIAL   2.  Pt will report at least a 40% improvement in symptoms. Baseline:  Goal status: INITIAL     LONG TERM GOALS: Target date: 06/19/2022   Pt will be independent with advanced HEP. Baseline:  Goal status: INITIAL   2.  Pt will improve modified  oswestry to 30% or less to demonstrate improvements with functional mobility. Baseline: 60% Goal status: INITIAL   3.  Pt will increase right hip strength to at least 4+/5 to improve ability to transfer her husband. Baseline:  Goal status: INITIAL   4.  Pt will report no increased pain when caring for her husband, including transferring her husband. Baseline:  Goal status: INITIAL     PLAN: PT FREQUENCY: 2x/week   PT DURATION: 8 weeks   PLANNED INTERVENTIONS: Therapeutic exercises, Therapeutic activity, Neuromuscular re-education, Balance training, Gait training, Patient/Family education, Self Care, Joint mobilization, Joint manipulation, Stair training, Aquatic Therapy, Dry Needling, Electrical stimulation, Cryotherapy, Moist heat, Taping, Ultrasound, Ionotophoresis '4mg'$ /ml Dexamethasone, Manual therapy, and Re-evaluation.   PLAN FOR NEXT SESSION: assess and progress HEP as indicated, strengthening, flexibility, core stability       Isabel Caprice, PT 05/01/2022, 3:59 PM

## 2022-05-02 ENCOUNTER — Other Ambulatory Visit: Payer: Self-pay

## 2022-05-02 ENCOUNTER — Encounter (HOSPITAL_BASED_OUTPATIENT_CLINIC_OR_DEPARTMENT_OTHER): Payer: Self-pay | Admitting: Obstetrics and Gynecology

## 2022-05-02 ENCOUNTER — Emergency Department (HOSPITAL_BASED_OUTPATIENT_CLINIC_OR_DEPARTMENT_OTHER)
Admission: EM | Admit: 2022-05-02 | Discharge: 2022-05-02 | Disposition: A | Discharge status: Home / Self Care | Payer: Medicare HMO | Attending: Emergency Medicine | Admitting: Emergency Medicine

## 2022-05-02 ENCOUNTER — Emergency Department (HOSPITAL_BASED_OUTPATIENT_CLINIC_OR_DEPARTMENT_OTHER): Payer: Medicare HMO

## 2022-05-02 DIAGNOSIS — R1013 Epigastric pain: Secondary | ICD-10-CM | POA: Diagnosis not present

## 2022-05-02 DIAGNOSIS — Z7982 Long term (current) use of aspirin: Secondary | ICD-10-CM | POA: Diagnosis not present

## 2022-05-02 DIAGNOSIS — I2510 Atherosclerotic heart disease of native coronary artery without angina pectoris: Secondary | ICD-10-CM | POA: Diagnosis not present

## 2022-05-02 DIAGNOSIS — R7989 Other specified abnormal findings of blood chemistry: Secondary | ICD-10-CM | POA: Diagnosis not present

## 2022-05-02 DIAGNOSIS — K5900 Constipation, unspecified: Secondary | ICD-10-CM | POA: Insufficient documentation

## 2022-05-02 LAB — COMPREHENSIVE METABOLIC PANEL
ALT: 54 U/L — ABNORMAL HIGH (ref 0–44)
AST: 34 U/L (ref 15–41)
Albumin: 4.2 g/dL (ref 3.5–5.0)
Alkaline Phosphatase: 33 U/L — ABNORMAL LOW (ref 38–126)
Anion gap: 8 (ref 5–15)
BUN: 24 mg/dL — ABNORMAL HIGH (ref 8–23)
CO2: 28 mmol/L (ref 22–32)
Calcium: 10.1 mg/dL (ref 8.9–10.3)
Chloride: 100 mmol/L (ref 98–111)
Creatinine, Ser: 0.85 mg/dL (ref 0.44–1.00)
GFR, Estimated: >60 mL/min (ref >60)
Glucose, Bld: 101 mg/dL — ABNORMAL HIGH (ref 70–99)
Potassium: 4.3 mmol/L (ref 3.5–5.1)
Sodium: 136 mmol/L (ref 135–145)
Total Bilirubin: 0.7 mg/dL (ref 0.3–1.2)
Total Protein: 6.6 g/dL (ref 6.5–8.1)

## 2022-05-02 LAB — CBC WITH DIFFERENTIAL/PLATELET
Abs Immature Granulocytes: 0.05 10*3/uL (ref 0.00–0.07)
Basophils Absolute: 0 10*3/uL (ref 0.0–0.1)
Basophils Relative: 0 %
Eosinophils Absolute: 0.1 10*3/uL (ref 0.0–0.5)
Eosinophils Relative: 1 %
HCT: 40.8 % (ref 36.0–46.0)
Hemoglobin: 13.7 g/dL (ref 12.0–15.0)
Immature Granulocytes: 1 %
Lymphocytes Relative: 34 %
Lymphs Abs: 2.5 10*3/uL (ref 0.7–4.0)
MCH: 31.9 pg (ref 26.0–34.0)
MCHC: 33.6 g/dL (ref 30.0–36.0)
MCV: 94.9 fL (ref 80.0–100.0)
Monocytes Absolute: 0.7 10*3/uL (ref 0.1–1.0)
Monocytes Relative: 10 %
Neutro Abs: 4 10*3/uL (ref 1.7–7.7)
Neutrophils Relative %: 54 %
Platelets: 188 10*3/uL (ref 150–400)
RBC: 4.3 MIL/uL (ref 3.87–5.11)
RDW: 13.2 % (ref 11.5–15.5)
WBC: 7.3 10*3/uL (ref 4.0–10.5)
nRBC: 0 % (ref 0.0–0.2)

## 2022-05-02 LAB — LIPASE, BLOOD: Lipase: 29 U/L (ref 11–51)

## 2022-05-02 MED ORDER — LACTATED RINGERS IV BOLUS
1000.0000 mL | Freq: Once | INTRAVENOUS | Status: AC
  Administered 2022-05-02: 1000 mL via INTRAVENOUS

## 2022-05-02 NOTE — Discharge Instructions (Addendum)
Use 2 scoops of miralax and if you don't have a bowel movement in 6 hours then do another 2 scoops.  If you start running a fever, having severe abdominal pain, nausea or vomiting again return to the emergency room.

## 2022-05-02 NOTE — ED Triage Notes (Signed)
Patient reports to the ER for constipation and sciatica. Patient reports she has not had a BM in 3 days. Patient reports she has done x2 doses of miralax and 1 enema at home.

## 2022-05-02 NOTE — ED Provider Notes (Signed)
[place] EMERGENCY DEPT Provider Note   CSN: [id number] Arrival date & time: 05/02/22  1134     History  Chief Complaint  Patient presents with   Constipation    Rachel Nichols is a 88 y.o. female.  Patient is an 88 year old female with a history of PUD, GERD, gastritis, duodenitis, CAD, sciatica that she is currently getting physical therapy for who is presenting today with complaints of inability to have a bowel movement.  Patient reports that she was seen on Monday and at that time reported it was because she was having leg pain however when reviewing the notes it seems she was here for abdominal pain.  She was complaining of epigastric pain and does admit that she was having nausea vomiting on Monday and Tuesday but reports that that has resolved.  However she has not had a great appetite.  She tried taking MiraLAX and doing an enema but reports she has not been able to have a bowel movement.  She did participate in physical therapy yesterday and reports she did very well but today she has been more uncomfortable in her right leg.  She denies any fever or abdominal pain at this time.  She was given antibiotics at her discharge and reports that she is taking them.  The history is provided by the patient and medical records.  Constipation      Home Medications Prior to Admission medications   Medication Sig Start Date End Date Taking? Authorizing Provider  aspirin 81 MG tablet Take 81 mg by mouth daily.    [provider]  cephALEXin (KEFLEX) 500 MG capsule Take 1 capsule (500 mg total) by mouth 2 (two) times daily. 04/30/22   Malvin Johns, MD  cholecalciferol (VITAMIN D) 1000 UNITS tablet Take 2,000 Units by mouth daily.    [provider]  Coenzyme Q10 (CO Q10) 100 MG CAPS Take 200 mg by mouth daily.    [provider]  Cyanocobalamin (VITAMIN B 12 PO) Take 500 mg by mouth daily.    [provider]  ezetimibe (ZETIA) 10 MG tablet  TAKE 1 TABLET EVERY DAY 04/12/22   Isaac Bliss, Rayford Halsted, MD  meloxicam (MOBIC) 7.5 MG tablet Take 1 tablet (7.5 mg total) by mouth daily. 04/25/22   Isaac Bliss, Rayford Halsted, MD  metoprolol succinate (TOPROL-XL) 25 MG 24 hr tablet TAKE 1/2 TABLET EVERY DAY NEED APPOINTMENT 04/04/21   Troy Sine, MD  Multiple Vitamins-Minerals (CENTRUM SILVER ULTRA WOMENS) TABS Take 1 tablet by mouth daily.    [provider]  omeprazole (PRILOSEC) 20 MG capsule Take 1 capsule (20 mg total) by mouth daily. 04/30/22   Malvin Johns, MD  predniSONE (STERAPRED UNI-PAK 21 TAB) 10 MG (21) TBPK tablet Take as directed. 04/25/22   Isaac Bliss, Rayford Halsted, MD  Probiotic Product (PROBIOTIC-10 PO) Take by mouth.    [provider]  rosuvastatin (CRESTOR) 10 MG tablet TAKE 1 TABLET EVERY DAY. MUST ATTEND UPCOMING APPOINTMENT FOR FUTURE REFILLS 07/05/21   Troy Sine, MD  saccharomyces boulardii (FLORASTOR) 250 MG capsule Take 1 capsule (250 mg total) by mouth 2 (two) times daily. 01/06/20   Isaac Bliss, Rayford Halsted, MD  sucralfate (CARAFATE) 1 GM/10ML suspension Take 10 mLs (1 g total) by mouth 4 (four) times daily -  with meals and at bedtime. 04/30/22   Malvin Johns, MD      Allergies    Diltiazem hcl, Tape, and Tramadol hcl    Review  of Systems   Review of Systems  Gastrointestinal:  Positive for constipation.    Physical Exam Updated Vital Signs BP (!) 144/77   Pulse (!) 57   Temp 98.7 F (37.1 C) (Oral)   Resp 17   SpO2 100%  Physical Exam Vitals and nursing note reviewed.  Constitutional:      General: She is not in acute distress.    Appearance: She is well-developed.  HENT:     Head: Normocephalic and atraumatic.     Mouth/Throat:     Mouth: Mucous membranes are dry.  Eyes:     Pupils: Pupils are equal, round, and reactive to light.  Cardiovascular:     Rate and Rhythm: Normal rate and regular rhythm.     Heart sounds: Normal heart sounds. No murmur heard.    No  friction rub.  Pulmonary:     Effort: Pulmonary effort is normal.     Breath sounds: Normal breath sounds. No wheezing or rales.  Abdominal:     General: Bowel sounds are normal. There is no distension.     Palpations: Abdomen is soft.     Tenderness: There is no abdominal tenderness. There is no guarding or rebound.  Genitourinary:    Comments: No stool in the rectum.  Normal rectal tone Musculoskeletal:        General: No tenderness. Normal range of motion.     Comments: No edema  Skin:    General: Skin is warm and dry.     Findings: No rash.  Neurological:     Mental Status: She is alert and oriented to person, place, and time. Mental status is at baseline.     Cranial Nerves: No cranial nerve deficit.  Psychiatric:        Mood and Affect: Mood normal.        Behavior: Behavior normal.     ED Results / Procedures / Treatments   Labs (all labs ordered are listed, but only abnormal results are displayed) Labs Reviewed  COMPREHENSIVE METABOLIC PANEL - Abnormal; Notable for the following components:      Result Value   Glucose, Bld 101 (*)    BUN 24 (*)    ALT 54 (*)    Alkaline Phosphatase 33 (*)    All other components within normal limits  URINE CULTURE  CBC WITH DIFFERENTIAL/PLATELET  LIPASE, BLOOD    EKG None  Radiology DG Abdomen 1 View  Result Date: 05/02/2022 CLINICAL DATA:  bloated constipation for 3 days has tried 2 doses of MiraLax and 1 enema EXAM: ABDOMEN - 1 VIEW COMPARISON:  December 29, 2013 FINDINGS: The bowel gas pattern is nonobstructive. There is moderate stool burden seen in the colon. No radio-opaque calculi or other significant radiographic abnormality are seen. Moderate lumbar spondylosis. IMPRESSION: Bowel-gas pattern is nonobstructive. Moderate stool burden in the colon. Electronically Signed   By: Frazier Richards M.D.   On: 05/02/2022 16:43   CT Abdomen Pelvis W Contrast  Result Date: 04/30/2022 CLINICAL DATA:  Abdominal pain. EXAM: CT ABDOMEN  AND PELVIS WITH CONTRAST TECHNIQUE: Multidetector CT imaging of the abdomen and pelvis was performed using the standard protocol following bolus administration of intravenous contrast. RADIATION DOSE REDUCTION: This exam was performed according to the departmental dose-optimization program which includes automated exposure control, adjustment of the mA and/or kV according to patient size and/or use of iterative reconstruction technique. CONTRAST:  50m OMNIPAQUE IOHEXOL 300 MG/ML  SOLN COMPARISON:  January 13, 2014 FINDINGS: Lower  chest: No acute abnormality. Hepatobiliary: There is diffuse fatty infiltration of the liver parenchyma. Multiple stable simple hepatic cysts are seen. Status post cholecystectomy. The common bile duct measures 1.4 cm in diameter. Pancreas: A stable 4.1 cm x 3.1 cm simple cyst is seen along the tip of the tail of the pancreas. No pancreatic ductal dilatation or surrounding inflammatory changes. Spleen: Normal in size without focal abnormality. Adrenals/Urinary Tract: Adrenal glands are unremarkable. Kidneys are normal, without renal calculi, focal lesion, or hydronephrosis. Bladder is unremarkable. Stomach/Bowel: Stomach is within normal limits. The appendix is not clearly identified. No evidence of bowel wall thickening, distention, or inflammatory changes. Noninflamed diverticula are seen throughout the sigmoid colon. Vascular/Lymphatic: Aortic atherosclerosis. No enlarged abdominal or pelvic lymph nodes. Reproductive: Uterus and bilateral adnexa are unremarkable. Other: No abdominal wall hernia or abnormality. No abdominopelvic ascites. Musculoskeletal: A chronic compression fracture deformity is seen at the level of L1. Multilevel degenerative changes are also seen throughout the lumbar spine. IMPRESSION: 1. Hepatic steatosis with multiple stable simple hepatic cysts. 2. Stable 4.1 cm x 3.1 cm simple cyst along the tip of the tail of the pancreas. 3. Sigmoid diverticulosis. 4. Chronic  compression fracture deformity at the level of L1. 5. Aortic atherosclerosis. Aortic Atherosclerosis (ICD10-I70.0). Electronically Signed   By: Virgina Norfolk M.D.   On: 04/30/2022 20:36    Procedures Procedures    Medications Ordered in ED Medications  lactated ringers bolus 1,000 mL (0 mLs Intravenous Stopped 05/02/22 1735)    ED Course/ Medical Decision Making/ A&P                           Medical Decision Making Amount and/or Complexity of Data Reviewed External Data Reviewed: notes. Labs: ordered. Decision-making details documented in ED Course. Radiology: ordered and independent interpretation performed. Decision-making details documented in ED Course.  Risk OTC drugs.   Pt with multiple medical problems and comorbidities and presenting today with a complaint that caries a high risk for morbidity and mortality.  Here today with inability to have a bowel movement.  Patient reports her last bowel movement was on Monday.  However in this timeframe patient reports vomiting on Monday and Tuesday but reports now it has resolved.  She has no abdominal pain on exam.  The only pain she complains of the sciatica in the right side which has been ongoing and she is getting physical therapy for this.  Concern that patient's constipation initially may have been related to medications however it also could be from decreased oral intake.  She has no focal abdominal tenderness concerning for diverticulitis and low suspicion for obstruction.  Patient had a CAT scan done on Monday when she was here with epigastric pain that showed cysts which were stable but no acute findings.  Concern for mild dehydration as patient has not been eating or drinking anything.  Also will do a KUB to ensure no new development of bowel obstruction but feel that patient has not had a bowel movement because she just has not been eating.  Do feel that she needs to continue her antibiotic at this time.  We will recheck labs  and imaging.  6:35 PM I have independently visualized and interpreted pt's images today.  Plain x-ray without evidence of obstruction today.  Patient does have moderate stool in her colon.  I independently interpreted patient's labs today and they are reassuring with a normal CBC, CMP with normal creatinine minimally elevated  BUN of 24 and normal electrolytes, lipase was within normal limits.  Urine culture was sent because at the last time patient was here she was started on Keflex for a UTI.  This time patient appears stable for discharge home.          Final Clinical Impression(s) / ED Diagnoses Final diagnoses:  Constipation, unspecified constipation type    Rx / DC Orders ED Discharge Orders     None         Blanchie Dessert, MD 05/02/22 1836

## 2022-05-03 LAB — URINE CULTURE: Culture: NO GROWTH

## 2022-05-04 ENCOUNTER — Encounter: Payer: Self-pay | Admitting: Rehabilitative and Restorative Service Providers"

## 2022-05-04 ENCOUNTER — Ambulatory Visit: Payer: Medicare HMO | Admitting: Rehabilitative and Restorative Service Providers"

## 2022-05-04 DIAGNOSIS — R252 Cramp and spasm: Secondary | ICD-10-CM

## 2022-05-04 DIAGNOSIS — M6281 Muscle weakness (generalized): Secondary | ICD-10-CM

## 2022-05-04 DIAGNOSIS — R2689 Other abnormalities of gait and mobility: Secondary | ICD-10-CM

## 2022-05-04 DIAGNOSIS — M5431 Sciatica, right side: Secondary | ICD-10-CM | POA: Diagnosis not present

## 2022-05-04 NOTE — Therapy (Signed)
OUTPATIENT PHYSICAL THERAPY TREATMENT NOTE   Patient Name: Rachel Nichols MRN: [id number] DOB:[birth year], 88 y.o., female Today's Date: 05/04/2022  PCP: Isaac Bliss, Rayford Halsted, MD REFERRING PROVIDER: Isaac Bliss, Rayford Halsted, MD  END OF SESSION:   PT End of Session - 05/04/22 1056     Visit Number 3    Date for PT Re-Evaluation 06/15/22    Authorization Type [employer]    Progress Note Due on Visit 10    PT Start Time 1051    PT Stop Time 6    PT Time Calculation (min) 39 min    Activity Tolerance Patient limited by pain    Behavior During Therapy [facility] for tasks assessed/performed             Past Medical History:  Diagnosis Date   Adenomatous colon polyp 2002   Colonoscopy    Benign neoplasm of colon 2004   Colonoscopy   CAD (coronary artery disease)    echo 09/13/10- EF>55%, aortic valve is mildly sclerotic; myoview 02-20-12-no ischemia   Carotid bruit    carotid dopplers 05/16/04- normal   Diverticulosis 2002,2004,2007   Colonoscopy   Duodenitis    Fibrocystic breast disease    Gastritis    GERD (gastroesophageal reflux disease)    Heart attack ([place]) 1993   Cardiolyte stress---  stents 2012   Hiatal hernia    History of blood transfusion    pt denies   Hyperlipidemia    Peptic ulcer due to Helicobacter pylori    PUD (peptic ulcer disease)    SCC (squamous cell carcinoma) 04/24/2011   right jawline (CX35FU), left forearm (CX35FU)   Past Surgical History:  Procedure Laterality Date   [place]     From the nose   CATARACT EXTRACTION  10/26/09   Right eye   CATARACT EXTRACTION  11/09/09   Left eye   CHOLECYSTECTOMY  1984   CORONARY STENT PLACEMENT  01/10/11   hig grade ramus branch stenosis with mod LAD dz, PCI/stenting of ramus branch with a Resolute DES    DILATION AND CURETTAGE OF UTERUS     FINGER SURGERY  04/2011   Cyst removal, right hand forefinger   LEFT OOPHORECTOMY  1957    SQUAMOUS CELL CARCINOMA EXCISION Right 07/24/2018   Posterior forearm shave & ED&C   Patient Active Problem List   Diagnosis Date Noted   IGT (impaired glucose tolerance) 01/12/2021   Hyperlipidemia LDL goal <70 01/13/2015   Murmur 01/13/2015   RUQ abdominal pain 01/22/2014   Pancreatic cyst 01/22/2014   Essential hypertension 03/06/2013   CAD S/P percutaneous coronary angioplasty 04/14/2012   GASTRITIS 10/19/2009   ABDOMINAL PAIN-EPIGASTRIC 10/18/2009   CHOLECYSTECTOMY, HX OF 10/18/2009   DIVERTICULOSIS, COLON 10/14/2009   COLONIC POLYPS, ADENOMATOUS, HX OF 10/14/2009   HIATAL HERNIA WITH REFLUX 09/19/2009   WEIGHT LOSS 09/19/2009   PUD, HX OF 09/19/2009   BACK PAIN 05/27/2008    REFERRING DIAG: M54.31 (ICD-10-CM) - Right sided sciatica  THERAPY DIAG:  Muscle weakness (generalized)  Cramp and spasm  Other abnormalities of gait and mobility  Rationale for Evaluation and Treatment Rehabilitation  PERTINENT HISTORY: None  PRECAUTIONS: none  SUBJECTIVE: Patient reports that she was able to have a BM and is going to be sure to take Miralax more regularly.  States that she is having some increased sciatica pain today.  PAIN:  Are you having pain? Yes: NPRS  scale: 7-8/10 Pain location: low back Pain description: aching Aggravating factors: standing  Relieving factors: rest, meds   OBJECTIVE: (objective measures completed at initial evaluation unless otherwise dated)  OBJECTIVE:    DIAGNOSTIC FINDINGS:  N/A   PATIENT SURVEYS:  Modified Oswestry 30 / 50 = 60.0 %                       SENSATION: Reports numbness and tingling down right leg   MUSCLE LENGTH: Right hamstring tightness noted.   POSTURE: rounded shoulders and forward head   PALPATION: Muscle spasms noted in lumbar multifidi   LUMBAR ROM: Limited at least 50% secondary to pain     LOWER EXTREMITY MMT:     MMT Right eval Left eval  Hip flexion 4- 5  Hip extension 4- 5  Hip abduction 4-  5  Hip adduction      Hip internal rotation      Hip external rotation      Knee flexion 5 5  Knee extension 5 5  Ankle dorsiflexion      Ankle plantarflexion      Ankle inversion      Ankle eversion       (Blank rows = not tested)   LUMBAR SPECIAL TESTS:  Slump test: Positive   FUNCTIONAL TESTS:  5 times sit to stand: 16.2 sec pushing up from thighs with 10/10 pain   GAIT: Distance walked: 75 ft Assistive device utilized: None Level of assistance: Complete Independence Comments: Antalgic gait pattern noted with decreased step length       TODAY'S TREATMENT:  05/04/2022: Nustep level 1 (seat at 8, blue machine), x5 min.  PT present to discuss status Practiced log rolling x2 reps Supine hamstring stretch with strap 2x20 sec RLE Supine IT band stretch with strap 2x20 sec RLE Addaday to right thigh x10 min RLE single knee to chest 2x20 sec Lower trunk rotation 5x10 sec bilat Supine posterior pelvic tilt, supine march with PPT 2x10 each Seated:  heel/toe raises, marching, LAQ, hip adduction ball squeeze.  BLE 2x10 Sit to/from stand with UE 2x5 Ambulation x50 ft without assistive device with increased pain.  Ambulation with SPC x75 ft with improved gait pattern noted.  05/01/2022:  Supine hamstring stretch x20 sec RLE Supine ITB/lateral hip Seated sciatic tensioner (slump test) to RLE x3 Seated LAQ with sciatic nerve floss x10 PPT PPT with 90/90 heel tap PPT with dying bug Hooklying trunk rotation x 20   04/24/2022:  Reviewed HEP (see below) Seated hamstring stretch x20 sec RLE Seated sciatic tensioner (slump test) to RLE x3 Seated LAQ with sciatic nerve floss x10     PATIENT EDUCATION:  Education details: Issued HEP Person educated: Patient Education method: Explanation, Media planner, and Handouts Education comprehension: verbalized understanding and returned demonstration     HOME EXERCISE PROGRAM: Access Code: [facility] URL:  [email or web address] Date: 05/01/2022 Prepared by: Candyce Churn  Exercises - Seated Hamstring Stretch with Chair  - 1 x daily - 7 x weekly - 1 sets - 3 reps - 30 sec hold - Seated Sciatic Tensioner  - 1 x daily - 7 x weekly - 1 sets - 10 reps - 5 sec hold - Seated Long Arc Quad  - 1 x daily - 7 x weekly - 2 sets - 10 reps - Supine Posterior Pelvic Tilt  - 1 x daily - 7 x weekly - 2 sets - 10 reps - Supine March  - 1 x  daily - 7 x weekly - 2 sets - 10 reps - Supine Lower Trunk Rotation  - 1 x daily - 7 x weekly - 2 sets - 10 reps - Supine Dead Bug with Leg Extension  - 1 x daily - 7 x weekly - 2 sets - 10 repsAccess Code: [email or web address]    ASSESSMENT:   CLINICAL IMPRESSION: Ms Gieselman presents to skilled rehabilitation today with reports of having increased pain.  Initiated stretching, but pt continues with pain, so proceeded to treatment with Addaday and pt reports a significant decrease to her pain level.  Pt then able to progress with further treatment session.  Pt provided with education for safe supine to/from sit with use of log roll to decreased strain on her back; she was able to return demonstration.  Pt with increased pain with ambulation, but reports that pain is improved with use of SPC.  Educated and advised pt to acquire [facility] to use for improved ambulation and decreased pain.  She verbalizes her understanding.  Pt continues to require skilled PT to progress towards goal related activities.     OBJECTIVE IMPAIRMENTS difficulty walking, decreased strength, increased muscle spasms, and pain.    ACTIVITY LIMITATIONS lifting, standing, and locomotion level   PARTICIPATION LIMITATIONS: cleaning, driving, and shopping   PERSONAL FACTORS Age and 1-2 comorbidities: GERD, Squamous Cell Carcinoma  are also affecting patient's functional outcome.    REHAB POTENTIAL: Good   CLINICAL DECISION MAKING: Evolving/moderate complexity   EVALUATION COMPLEXITY: Moderate      GOALS: Goals reviewed with patient? Yes   SHORT TERM GOALS: Target date: 05/15/2022   Pt will be independent with initial HEP. Baseline: Goal status: Ongoing   2.  Pt will report at least a 40% improvement in symptoms. Baseline:  Goal status: INITIAL     LONG TERM GOALS: Target date: 06/19/2022   Pt will be independent with advanced HEP. Baseline:  Goal status: INITIAL   2.  Pt will improve modified oswestry to 30% or less to demonstrate improvements with functional mobility. Baseline: 60% Goal status: INITIAL   3.  Pt will increase right hip strength to at least 4+/5 to improve ability to transfer her husband. Baseline:  Goal status: INITIAL   4.  Pt will report no increased pain when caring for her husband, including transferring her husband. Baseline:  Goal status: INITIAL     PLAN: PT FREQUENCY: 2x/week   PT DURATION: 8 weeks   PLANNED INTERVENTIONS: Therapeutic exercises, Therapeutic activity, Neuromuscular re-education, Balance training, Gait training, Patient/Family education, Self Care, Joint mobilization, Joint manipulation, Stair training, Aquatic Therapy, Dry Needling, Electrical stimulation, Cryotherapy, Moist heat, Taping, Ultrasound, Ionotophoresis '4mg'$ /ml Dexamethasone, Manual therapy, and Re-evaluation.   PLAN FOR NEXT SESSION: ask if pt acquired a SPC, assess and progress HEP as indicated, strengthening, flexibility, core stability       Juel Burrow, PT 05/04/2022, 11:38 AM  [facility] [address], [place], [place] [address] Phone # [phone] Fax [phone]

## 2022-05-07 ENCOUNTER — Ambulatory Visit (INDEPENDENT_AMBULATORY_CARE_PROVIDER_SITE_OTHER): Payer: Medicare HMO | Admitting: Internal Medicine

## 2022-05-07 VITALS — BP 126/62 | HR 61 | Temp 98.2°F | Wt 121.2 lb

## 2022-05-07 DIAGNOSIS — M5431 Sciatica, right side: Secondary | ICD-10-CM | POA: Diagnosis not present

## 2022-05-07 DIAGNOSIS — Z09 Encounter for follow-up examination after completed treatment for conditions other than malignant neoplasm: Secondary | ICD-10-CM

## 2022-05-07 NOTE — Progress Notes (Signed)
Established Patient Office Visit     CC/Reason for Visit: Follow-up ED visits and sciatica  HPI: Rachel Nichols is a 88 y.o. female who is coming in today for the above mentioned reasons.  She has been dealing with low back pain and right-sided sciatica since beginning of July.  She has been given IM and oral steroids as well as meloxicam which helped.  She has also started physical therapy with significant improvement.  Her posterior right leg is still very painful throughout the day.  She has had several ED visits due to upper abdominal pain which was thought related to meloxicam and prednisone and then to constipation.  She has been taking MiraLAX and has been having a soft bowel movement every 1 to 2 days.  Past Medical/Surgical History: Past Medical History:  Diagnosis Date   Adenomatous colon polyp 2002   Colonoscopy    Benign neoplasm of colon 2004   Colonoscopy   CAD (coronary artery disease)    echo 09/13/10- EF>55%, aortic valve is mildly sclerotic; myoview 02-20-12-no ischemia   Carotid bruit    carotid dopplers 05/16/04- normal   Diverticulosis 2002,2004,2007   Colonoscopy   Duodenitis    Fibrocystic breast disease    Gastritis    GERD (gastroesophageal reflux disease)    Heart attack ([place]) 1993   Cardiolyte stress---  stents 2012   Hiatal hernia    History of blood transfusion    pt denies   Hyperlipidemia    Peptic ulcer due to Helicobacter pylori    PUD (peptic ulcer disease)    SCC (squamous cell carcinoma) 04/24/2011   right jawline (CX35FU), left forearm (CX35FU)    Past Surgical History:  Procedure Laterality Date   [place]     From the nose   CATARACT EXTRACTION  10/26/09   Right eye   CATARACT EXTRACTION  11/09/09   Left eye   CHOLECYSTECTOMY  1984   CORONARY STENT PLACEMENT  01/10/11   hig grade ramus branch stenosis with mod LAD dz, PCI/stenting of ramus branch with a Resolute DES     DILATION AND CURETTAGE OF UTERUS     FINGER SURGERY  04/2011   Cyst removal, right hand forefinger   LEFT OOPHORECTOMY  1957   SQUAMOUS CELL CARCINOMA EXCISION Right 07/24/2018   Posterior forearm shave & ED&C    Social History:  reports that she quit smoking about 46 years ago. Her smoking use included cigarettes. She has a 30.00 pack-year smoking history. She has never used smokeless tobacco. She reports that she does not drink alcohol and does not use drugs.  Allergies: Allergies  Allergen Reactions   Diltiazem Hcl    Tape Other (See Comments)    It pulls the skin off.   Tramadol Hcl     Family History:  Family History  Problem Relation Age of Onset   Heart disease Brother 74       MI   Heart disease Sister        stents   Neuropathy Sister    Hypertension Sister    Stroke Mother    Heart disease Father    Heart attack Father    Stroke Maternal Grandfather    Heart attack Paternal Grandmother    Heart attack Paternal Grandfather    Breast cancer Maternal Aunt        x 2 aunts   Irritable bowel  syndrome Brother    Heart attack Brother    Epilepsy Daughter    Heart attack Paternal Aunt        x 9   Colon cancer Neg Hx      Current Outpatient Medications:    aspirin 81 MG tablet, Take 81 mg by mouth daily., Disp: , Rfl:    cholecalciferol (VITAMIN D) 1000 UNITS tablet, Take 2,000 Units by mouth daily., Disp: , Rfl:    Coenzyme Q10 (CO Q10) 100 MG CAPS, Take 200 mg by mouth daily., Disp: , Rfl:    Cyanocobalamin (VITAMIN B 12 PO), Take 500 mg by mouth daily., Disp: , Rfl:    ezetimibe (ZETIA) 10 MG tablet, TAKE 1 TABLET EVERY DAY, Disp: 90 tablet, Rfl: 1   metoprolol succinate (TOPROL-XL) 25 MG 24 hr tablet, TAKE 1/2 TABLET EVERY DAY NEED APPOINTMENT, Disp: 45 tablet, Rfl: 0   Multiple Vitamins-Minerals (CENTRUM SILVER ULTRA WOMENS) TABS, Take 1 tablet by mouth daily., Disp: , Rfl:    omeprazole (PRILOSEC) 20 MG capsule, Take 1 capsule (20 mg total) by mouth  daily., Disp: 20 capsule, Rfl: 0   Probiotic Product (PROBIOTIC-10 PO), Take by mouth., Disp: , Rfl:    rosuvastatin (CRESTOR) 10 MG tablet, TAKE 1 TABLET EVERY DAY. MUST ATTEND UPCOMING APPOINTMENT FOR FUTURE REFILLS, Disp: 90 tablet, Rfl: 3   saccharomyces boulardii (FLORASTOR) 250 MG capsule, Take 1 capsule (250 mg total) by mouth 2 (two) times daily., Disp: 180 capsule, Rfl: 1   sucralfate (CARAFATE) 1 GM/10ML suspension, Take 10 mLs (1 g total) by mouth 4 (four) times daily -  with meals and at bedtime., Disp: 420 mL, Rfl: 0   meloxicam (MOBIC) 7.5 MG tablet, Take 1 tablet (7.5 mg total) by mouth daily. (Patient not taking: Reported on 05/07/2022), Disp: 30 tablet, Rfl: 0  Review of Systems:  Constitutional: Denies fever, chills, diaphoresis, appetite change and fatigue.  HEENT: Denies photophobia, eye pain, redness, hearing loss, ear pain, congestion, sore throat, rhinorrhea, sneezing, mouth sores, trouble swallowing, neck pain, neck stiffness and tinnitus.   Respiratory: Denies SOB, DOE, cough, chest tightness,  and wheezing.   Cardiovascular: Denies chest pain, palpitations and leg swelling.  Gastrointestinal: Denies nausea, vomiting, abdominal pain, diarrhea, constipation, blood in stool and abdominal distention.  Genitourinary: Denies dysuria, urgency, frequency, hematuria, flank pain and difficulty urinating.  Endocrine: Denies: hot or cold intolerance, sweats, changes in hair or nails, polyuria, polydipsia. Musculoskeletal: Denies myalgias, back pain, joint swelling, arthralgias and gait problem.  Skin: Denies pallor, rash and wound.  Neurological: Denies dizziness, seizures, syncope, weakness, light-headedness, numbness and headaches.  Hematological: Denies adenopathy. Easy bruising, personal or family bleeding history  Psychiatric/Behavioral: Denies suicidal ideation, mood changes, confusion, nervousness, sleep disturbance and agitation    Physical Exam: Vitals:   05/07/22 0845   BP: 126/62  Pulse: 61  Temp: 98.2 F (36.8 C)  TempSrc: Oral  SpO2: 98%  Weight: 121 lb 3.2 oz (55 kg)    Body mass index is 20.8 kg/m.   Constitutional: NAD, calm, comfortable Eyes: PERRL, lids and conjunctivae normal ENMT: Mucous membranes are moist.  Psychiatric: Normal judgment and insight. Alert and oriented x 3. Normal mood.    Impression and Plan:  Hospital discharge follow-up  Right sided sciatica  -Emergency department visits dated 7/24 and 7/26 have been reviewed in detail. -Constipation is improving with increasing fluid consumption and MiraLAX. -She continues PT for her sciatica with some improvement.  I will consider MRI of her lumbar  spine if no significant improvement in about 8 to 12 weeks.  Time spent:23 minutes reviewing chart, interviewing and examining patient and formulating plan of care.    Lelon Frohlich, MD [facility]

## 2022-05-08 ENCOUNTER — Telehealth: Payer: Self-pay | Admitting: Internal Medicine

## 2022-05-08 NOTE — Telephone Encounter (Signed)
Pt notified of PCP notes during the visit. Pt verb understanding.

## 2022-05-08 NOTE — Telephone Encounter (Signed)
OV notes from 05/07/22:  She continues PT for her sciatica with some improvement.  I will consider MRI of her lumbar spine if no significant improvement in about 8 to 12 weeks.  LVM instructions for pt to return call.

## 2022-05-08 NOTE — Telephone Encounter (Signed)
Pt called to request a referral for an MRI for her leg pain. Please advise. [phone]

## 2022-05-08 NOTE — Therapy (Signed)
OUTPATIENT PHYSICAL THERAPY TREATMENT NOTE   Patient Name: Rachel Nichols MRN: [id number] DOB:04/16/1931, 88 y.o., female Today's Date: 05/11/2022  PCP: Isaac Bliss, Rayford Halsted, MD REFERRING PROVIDER: Isaac Bliss, Rayford Halsted, MD  END OF SESSION:   PT End of Session - 05/11/22 1011     Visit Number 4    Date for PT Re-Evaluation 06/15/22    Authorization Type [employer]    Progress Note Due on Visit 10    PT Start Time 1008    PT Stop Time 1050    PT Time Calculation (min) 42 min    Equipment Utilized During Treatment Gait belt    Activity Tolerance Patient limited by pain    Behavior During Therapy WFL for tasks assessed/performed              Past Medical History:  Diagnosis Date   Adenomatous colon polyp 2002   Colonoscopy    Benign neoplasm of colon 2004   Colonoscopy   CAD (coronary artery disease)    echo 09/13/10- EF>55%, aortic valve is mildly sclerotic; myoview 02-20-12-no ischemia   Carotid bruit    carotid dopplers 05/16/04- normal   Diverticulosis 2002,2004,2007   Colonoscopy   Duodenitis    Fibrocystic breast disease    Gastritis    GERD (gastroesophageal reflux disease)    Heart attack ([place]) 1993   Cardiolyte stress---  stents 2012   Hiatal hernia    History of blood transfusion    pt denies   Hyperlipidemia    Peptic ulcer due to Helicobacter pylori    PUD (peptic ulcer disease)    SCC (squamous cell carcinoma) 04/24/2011   right jawline (CX35FU), left forearm (CX35FU)   Past Surgical History:  Procedure Laterality Date   [place]     From the nose   CATARACT EXTRACTION  10/26/09   Right eye   CATARACT EXTRACTION  11/09/09   Left eye   CHOLECYSTECTOMY  1984   CORONARY STENT PLACEMENT  01/10/11   hig grade ramus branch stenosis with mod LAD dz, PCI/stenting of ramus branch with a Resolute DES    DILATION AND CURETTAGE OF UTERUS     FINGER SURGERY  04/2011   Cyst removal,  right hand forefinger   LEFT OOPHORECTOMY  1957   SQUAMOUS CELL CARCINOMA EXCISION Right 07/24/2018   Posterior forearm shave & ED&C   Patient Active Problem List   Diagnosis Date Noted   IGT (impaired glucose tolerance) 01/12/2021   Hyperlipidemia LDL goal <70 01/13/2015   Murmur 01/13/2015   RUQ abdominal pain 01/22/2014   Pancreatic cyst 01/22/2014   Essential hypertension 03/06/2013   CAD S/P percutaneous coronary angioplasty 04/14/2012   GASTRITIS 10/19/2009   ABDOMINAL PAIN-EPIGASTRIC 10/18/2009   CHOLECYSTECTOMY, HX OF 10/18/2009   DIVERTICULOSIS, COLON 10/14/2009   COLONIC POLYPS, ADENOMATOUS, HX OF 10/14/2009   HIATAL HERNIA WITH REFLUX 09/19/2009   WEIGHT LOSS 09/19/2009   PUD, HX OF 09/19/2009   BACK PAIN 05/27/2008    REFERRING DIAG: M54.31 (ICD-10-CM) - Right sided sciatica  THERAPY DIAG:  Muscle weakness (generalized)  Other abnormalities of gait and mobility  Cramp and spasm  Difficulty in walking, not elsewhere classified  Rationale for Evaluation and Treatment Rehabilitation  PERTINENT HISTORY: None  PRECAUTIONS: none  SUBJECTIVE: Patient states that she is ready for PT, her knee is killing her this morning.   PAIN:  Are you having pain? Yes:  NPRS scale: 7/10 Pain location: low back, R hip into leg  Pain description: aching Aggravating factors: standing  Relieving factors: rest, meds   OBJECTIVE: (objective measures completed at initial evaluation unless otherwise dated)  OBJECTIVE:    DIAGNOSTIC FINDINGS:  N/A   PATIENT SURVEYS:  Modified Oswestry 30 / 50 = 60.0 %                       SENSATION: Reports numbness and tingling down right leg   MUSCLE LENGTH: Right hamstring tightness noted.   POSTURE: rounded shoulders and forward head   PALPATION: Muscle spasms noted in lumbar multifidi   LUMBAR ROM: Limited at least 50% secondary to pain     LOWER EXTREMITY MMT:     MMT Right eval Left eval  Hip flexion 4- 5  Hip  extension 4- 5  Hip abduction 4- 5  Hip adduction      Hip internal rotation      Hip external rotation      Knee flexion 5 5  Knee extension 5 5  Ankle dorsiflexion      Ankle plantarflexion      Ankle inversion      Ankle eversion       (Blank rows = not tested)   LUMBAR SPECIAL TESTS:  Slump test: Positive   FUNCTIONAL TESTS:  5 times sit to stand: 16.2 sec pushing up from thighs with 10/10 pain   GAIT: Distance walked: 75 ft Assistive device utilized: None Level of assistance: Complete Independence Comments: Antalgic gait pattern noted with decreased step length       TODAY'S TREATMENT:  05/11/2022: Nustep level 1 (seat at 8, blue machine), x5 min.  PT present to discuss status Practiced log rolling x2 reps Supine hamstring stretch with strap 2x20 sec RLE Supine IT band stretch with strap 2x20 sec RLE RLE single knee to chest 2x20 sec Lower trunk rotation 5x10 sec bilat Supine posterior pelvic tilt, supine march with PPT 2x10 each Supine ball squeeze 5" hold 1x10 - pt reporting increased pain in R hip.  Supine clams 2x10 with RTB Seated:  heel/toe raises, marching, LAQ,   BLE 2x10 Sit to/from stand with UE 1x5 with 1000gm ball 1x5 without.  Ambulation x20f with increased pain in R hip and antalgic gait pattern.  Addaday to right thigh x10 min Moist heat to R hip for 8 min Long axis and Lateral distraction to tolerance.   05/04/2022: Nustep level 1 (seat at 8, blue machine), x5 min.  PT present to discuss status Practiced log rolling x2 reps Supine hamstring stretch with strap 2x20 sec RLE Supine IT band stretch with strap 2x20 sec RLE Addaday to right thigh x10 min RLE single knee to chest 2x20 sec Lower trunk rotation 5x10 sec bilat Supine posterior pelvic tilt, supine march with PPT 2x10 each Seated:  heel/toe raises, marching, LAQ, hip adduction ball squeeze.  BLE 2x10 Sit to/from stand with UE 2x5 Ambulation x50 ft without assistive device with  increased pain.  Ambulation with SPC x75 ft with improved gait pattern noted.  05/01/2022:  Supine hamstring stretch x20 sec RLE Supine ITB/lateral hip Seated sciatic tensioner (slump test) to RLE x3 Seated LAQ with sciatic nerve floss x10 PPT PPT with 90/90 heel tap PPT with dying bug Hooklying trunk rotation x 20   04/24/2022:  Reviewed HEP (see below) Seated hamstring stretch x20 sec RLE Seated sciatic tensioner (slump test) to RLE x3 Seated LAQ with  sciatic nerve floss x10     PATIENT EDUCATION:  Education details: Issued HEP Person educated: Patient Education method: Explanation, Demonstration, and Handouts Education comprehension: verbalized understanding and returned demonstration     HOME EXERCISE PROGRAM: Access Code: [facility] URL: [email or web address] Date: 05/01/2022 Prepared by: Candyce Churn  Exercises - Seated Hamstring Stretch with Chair  - 1 x daily - 7 x weekly - 1 sets - 3 reps - 30 sec hold - Seated Sciatic Tensioner  - 1 x daily - 7 x weekly - 1 sets - 10 reps - 5 sec hold - Seated Long Arc Quad  - 1 x daily - 7 x weekly - 2 sets - 10 reps - Supine Posterior Pelvic Tilt  - 1 x daily - 7 x weekly - 2 sets - 10 reps - Supine March  - 1 x daily - 7 x weekly - 2 sets - 10 reps - Supine Lower Trunk Rotation  - 1 x daily - 7 x weekly - 2 sets - 10 reps - Supine Dead Bug with Leg Extension  - 1 x daily - 7 x weekly - 2 sets - 10 repsAccess Code: [email or web address]    ASSESSMENT:   CLINICAL IMPRESSION: Ms Varghese presents to skilled rehabilitation today with reports of having increased pain. Initiated session on Nustep, followed by stretching with pt reporting slight improvements in pain today. Pt then able to progress with further treatment session with limitations noted with pain with sit to stands. Attempted to walk with SPC to assess pain, with pt reporting unbearable pain. Pt states "I just want to shoot myself". Tried to verbally encourage and  assisted to sidelying position. Pt ended session with moist heat to R hip with noted improvements per pt. She would benefit from follow up appt with MD to get x-rays of R hip due to possible OA present. Pt also mentioned loosing 17lbs in past 2 months, encourgaed pt to make a follow up appt with MD to further address weight loss. Continued to advise pt on acquiring SPC to use for improved ambulation and decreased pain.  She verbalizes her understanding. Pt continues to require skilled PT to progress towards goal related activities.      OBJECTIVE IMPAIRMENTS difficulty walking, decreased strength, increased muscle spasms, and pain.    ACTIVITY LIMITATIONS lifting, standing, and locomotion level   PARTICIPATION LIMITATIONS: cleaning, driving, and shopping   PERSONAL FACTORS Age and 1-2 comorbidities: GERD, Squamous Cell Carcinoma  are also affecting patient's functional outcome.    REHAB POTENTIAL: Good   CLINICAL DECISION MAKING: Evolving/moderate complexity   EVALUATION COMPLEXITY: Moderate     GOALS: Goals reviewed with patient? Yes   SHORT TERM GOALS: Target date: 05/15/2022   Pt will be independent with initial HEP. Baseline: Goal status: Ongoing   2.  Pt will report at least a 40% improvement in symptoms. Baseline:  Goal status: INITIAL     LONG TERM GOALS: Target date: 06/19/2022   Pt will be independent with advanced HEP. Baseline:  Goal status: INITIAL   2.  Pt will improve modified oswestry to 30% or less to demonstrate improvements with functional mobility. Baseline: 60% Goal status: INITIAL   3.  Pt will increase right hip strength to at least 4+/5 to improve ability to transfer her husband. Baseline:  Goal status: INITIAL   4.  Pt will report no increased pain when caring for her husband, including transferring her husband. Baseline:  Goal status: INITIAL     PLAN:  PT FREQUENCY: 2x/week   PT DURATION: 8 weeks   PLANNED INTERVENTIONS: Therapeutic  exercises, Therapeutic activity, Neuromuscular re-education, Balance training, Gait training, Patient/Family education, Self Care, Joint mobilization, Joint manipulation, Stair training, Aquatic Therapy, Dry Needling, Electrical stimulation, Cryotherapy, Moist heat, Taping, Ultrasound, Ionotophoresis '4mg'$ /ml Dexamethasone, Manual therapy, and Re-evaluation.   PLAN FOR NEXT SESSION: ask if pt acquired a SPC, assess and progress HEP as indicated, strengthening, flexibility, core stability       Rudi Heap PT, DPT  [facility] [address] [place] [address] [place], [place] [address] Phone # [phone] Fax [phone]

## 2022-05-11 ENCOUNTER — Encounter: Payer: Self-pay | Admitting: Physical Therapy

## 2022-05-11 ENCOUNTER — Ambulatory Visit: Payer: Medicare HMO | Attending: Internal Medicine | Admitting: Physical Therapy

## 2022-05-11 DIAGNOSIS — R262 Difficulty in walking, not elsewhere classified: Secondary | ICD-10-CM | POA: Diagnosis not present

## 2022-05-11 DIAGNOSIS — R2689 Other abnormalities of gait and mobility: Secondary | ICD-10-CM | POA: Diagnosis not present

## 2022-05-11 DIAGNOSIS — R252 Cramp and spasm: Secondary | ICD-10-CM | POA: Insufficient documentation

## 2022-05-11 DIAGNOSIS — M6281 Muscle weakness (generalized): Secondary | ICD-10-CM | POA: Insufficient documentation

## 2022-05-15 ENCOUNTER — Ambulatory Visit: Payer: Medicare HMO | Admitting: Rehabilitative and Restorative Service Providers"

## 2022-05-15 ENCOUNTER — Encounter: Payer: Self-pay | Admitting: Rehabilitative and Restorative Service Providers"

## 2022-05-15 DIAGNOSIS — R2689 Other abnormalities of gait and mobility: Secondary | ICD-10-CM

## 2022-05-15 DIAGNOSIS — M6281 Muscle weakness (generalized): Secondary | ICD-10-CM | POA: Diagnosis not present

## 2022-05-15 DIAGNOSIS — R262 Difficulty in walking, not elsewhere classified: Secondary | ICD-10-CM

## 2022-05-15 DIAGNOSIS — R252 Cramp and spasm: Secondary | ICD-10-CM | POA: Diagnosis not present

## 2022-05-15 NOTE — Therapy (Signed)
OUTPATIENT PHYSICAL THERAPY TREATMENT NOTE   Patient Name: Rachel Nichols MRN: [id number] DOB:05-22-1931, 88 y.o., female Today's Date: 05/15/2022  PCP: Isaac Bliss, Rayford Halsted, MD REFERRING PROVIDER: Isaac Bliss, Rayford Halsted, MD  END OF SESSION:   PT End of Session - 05/15/22 1541     Visit Number 5    Date for PT Re-Evaluation 06/15/22    Authorization Type [employer] Medicare    Progress Note Due on Visit 10    PT Start Time 1538    PT Stop Time 1618    PT Time Calculation (min) 40 min    Activity Tolerance Patient limited by pain    Behavior During Therapy [facility] for tasks assessed/performed             Past Medical History:  Diagnosis Date   Adenomatous colon polyp 2002   Colonoscopy    Benign neoplasm of colon 2004   Colonoscopy   CAD (coronary artery disease)    echo 09/13/10- EF>55%, aortic valve is mildly sclerotic; myoview 02-20-12-no ischemia   Carotid bruit    carotid dopplers 05/16/04- normal   Diverticulosis 2002,2004,2007   Colonoscopy   Duodenitis    Fibrocystic breast disease    Gastritis    GERD (gastroesophageal reflux disease)    Heart attack ([place]) 1993   Cardiolyte stress---  stents 2012   Hiatal hernia    History of blood transfusion    pt denies   Hyperlipidemia    Peptic ulcer due to Helicobacter pylori    PUD (peptic ulcer disease)    SCC (squamous cell carcinoma) 04/24/2011   right jawline (CX35FU), left forearm (CX35FU)   Past Surgical History:  Procedure Laterality Date   [place]     From the nose   CATARACT EXTRACTION  10/26/09   Right eye   CATARACT EXTRACTION  11/09/09   Left eye   CHOLECYSTECTOMY  1984   CORONARY STENT PLACEMENT  01/10/11   hig grade ramus branch stenosis with mod LAD dz, PCI/stenting of ramus branch with a Resolute DES    DILATION AND CURETTAGE OF UTERUS     FINGER SURGERY  04/2011   Cyst removal, right hand forefinger   LEFT OOPHORECTOMY  1957    SQUAMOUS CELL CARCINOMA EXCISION Right 07/24/2018   Posterior forearm shave & ED&C   Patient Active Problem List   Diagnosis Date Noted   IGT (impaired glucose tolerance) 01/12/2021   Hyperlipidemia LDL goal <70 01/13/2015   Murmur 01/13/2015   RUQ abdominal pain 01/22/2014   Pancreatic cyst 01/22/2014   Essential hypertension 03/06/2013   CAD S/P percutaneous coronary angioplasty 04/14/2012   GASTRITIS 10/19/2009   ABDOMINAL PAIN-EPIGASTRIC 10/18/2009   CHOLECYSTECTOMY, HX OF 10/18/2009   DIVERTICULOSIS, COLON 10/14/2009   COLONIC POLYPS, ADENOMATOUS, HX OF 10/14/2009   HIATAL HERNIA WITH REFLUX 09/19/2009   WEIGHT LOSS 09/19/2009   PUD, HX OF 09/19/2009   BACK PAIN 05/27/2008    REFERRING DIAG: M54.31 (ICD-10-CM) - Right sided sciatica  THERAPY DIAG:  Muscle weakness (generalized)  Other abnormalities of gait and mobility  Cramp and spasm  Difficulty in walking, not elsewhere classified  Rationale for Evaluation and Treatment Rehabilitation  PERTINENT HISTORY: None  PRECAUTIONS: none  SUBJECTIVE: Patient reports that she was able to have a BM and is going to be sure to take Miralax more regularly.  States that she is having some increased sciatica pain today.  PAIN:  Are you having pain? Yes: NPRS scale: 7-8/10 Pain location: low back Pain description: aching Aggravating factors: standing  Relieving factors: rest, meds   OBJECTIVE: (objective measures completed at initial evaluation unless otherwise dated)  OBJECTIVE:    DIAGNOSTIC FINDINGS:  N/A   PATIENT SURVEYS:  Modified Oswestry 30 / 50 = 60.0 %                       SENSATION: Reports numbness and tingling down right leg   MUSCLE LENGTH: Right hamstring tightness noted.   POSTURE: rounded shoulders and forward head   PALPATION: Muscle spasms noted in lumbar multifidi   LUMBAR ROM: Limited at least 50% secondary to pain     LOWER EXTREMITY MMT:     MMT Right eval Left eval  Hip  flexion 4- 5  Hip extension 4- 5  Hip abduction 4- 5  Hip adduction      Hip internal rotation      Hip external rotation      Knee flexion 5 5  Knee extension 5 5  Ankle dorsiflexion      Ankle plantarflexion      Ankle inversion      Ankle eversion       (Blank rows = not tested)   LUMBAR SPECIAL TESTS:  Slump test: Positive   FUNCTIONAL TESTS:  04/24/2022:  5 times sit to stand: 16.2 sec pushing up from thighs with 10/10 pain  05/15/2022:   3 min walk test: 189 ft with SPC.  Pt had to sit down at 2.5 minutes and sat for the last 30 sec of the assessment   GAIT: Distance walked: 75 ft Assistive device utilized: None Level of assistance: Complete Independence Comments: Antalgic gait pattern noted with decreased step length       TODAY'S TREATMENT:  05/15/2022: Nustep level 1 (seat at 8, blue machine), x5 min.  PT present to discuss status Seated:  heel/toe raises, marching, LAQ, hip abduction scissors, hip adduction ball squeeze.  BLE 2x10 Seated core series with red plyoball:  hip to hip, hip to shoulder.  X10 bilat Seated ab sets 2x10 Seated LAQ with 5 counts of sciatic nerve floss with ankle pumps x5 RLE Manual:  soft tissue mobilization to right calf, quads, hamstring, and IT band.  Pt reports decreased pain following. Ambulation around PT gym with SPC and SBA with cuing to look ahead and improve posture instead of looking down at the floor.  05/11/2022: Nustep level 1 (seat at 8, blue machine), x5 min.  PT present to discuss status Practiced log rolling x2 reps Supine hamstring stretch with strap 2x20 sec RLE Supine IT band stretch with strap 2x20 sec RLE RLE single knee to chest 2x20 sec Lower trunk rotation 5x10 sec bilat Supine posterior pelvic tilt, supine march with PPT 2x10 each Supine ball squeeze 5" hold 1x10 - pt reporting increased pain in R hip.  Supine clams 2x10 with RTB Seated:  heel/toe raises, marching, LAQ,   BLE 2x10 Sit to/from stand with UE  1x5 with 1000gm ball 1x5 without.  Ambulation x78f with increased pain in R hip and antalgic gait pattern.  Addaday to right thigh x10 min Moist heat to R hip for 8 min Long axis and Lateral distraction to tolerance   05/04/2022: Nustep level 1 (seat at 8, blue machine), x5 min.  PT present to discuss status Practiced log rolling x2 reps Supine hamstring stretch with strap 2x20 sec RLE Supine  IT band stretch with strap 2x20 sec RLE Addaday to right thigh x10 min RLE single knee to chest 2x20 sec Lower trunk rotation 5x10 sec bilat Supine posterior pelvic tilt, supine march with PPT 2x10 each Seated:  heel/toe raises, marching, LAQ, hip adduction ball squeeze.  BLE 2x10 Sit to/from stand with UE 2x5 Ambulation x50 ft without assistive device with increased pain.  Ambulation with SPC x75 ft with improved gait pattern noted.      PATIENT EDUCATION:  Education details: Issued HEP Person educated: Patient Education method: Explanation, Media planner, and Handouts Education comprehension: verbalized understanding and returned demonstration     HOME EXERCISE PROGRAM: Access Code: [facility] URL: [email or web address] Date: 05/01/2022 Prepared by: Candyce Churn  Exercises - Seated Hamstring Stretch with Chair  - 1 x daily - 7 x weekly - 1 sets - 3 reps - 30 sec hold - Seated Sciatic Tensioner  - 1 x daily - 7 x weekly - 1 sets - 10 reps - 5 sec hold - Seated Long Arc Quad  - 1 x daily - 7 x weekly - 2 sets - 10 reps - Supine Posterior Pelvic Tilt  - 1 x daily - 7 x weekly - 2 sets - 10 reps - Supine March  - 1 x daily - 7 x weekly - 2 sets - 10 reps - Supine Lower Trunk Rotation  - 1 x daily - 7 x weekly - 2 sets - 10 reps - Supine Dead Bug with Leg Extension  - 1 x daily - 7 x weekly - 2 sets - 10 repsAccess Code: [email or web address]    ASSESSMENT:   CLINICAL IMPRESSION: Ms Bolte presents to skilled rehabilitation today with reports of continued right leg pain and that she  still has not obtained a cane.  Pt verbalizes her understanding that she would benefit from a cane and states that she will try to remember to get one.  Pt tolerated session well today and did not experience extreme pain as she has in previous sessions.  Pt able to tolerate 2.5 minutes of ambulation prior to needing to have a seated recovery period.  Pt continues to require skilled PT to progress towards goal related activities.     OBJECTIVE IMPAIRMENTS difficulty walking, decreased strength, increased muscle spasms, and pain.    ACTIVITY LIMITATIONS lifting, standing, and locomotion level   PARTICIPATION LIMITATIONS: cleaning, driving, and shopping   PERSONAL FACTORS Age and 1-2 comorbidities: GERD, Squamous Cell Carcinoma  are also affecting patient's functional outcome.    REHAB POTENTIAL: Good   CLINICAL DECISION MAKING: Evolving/moderate complexity   EVALUATION COMPLEXITY: Moderate     GOALS: Goals reviewed with patient? Yes   SHORT TERM GOALS: Target date: 05/15/2022   Pt will be independent with initial HEP. Baseline: Goal status: Ongoing   2.  Pt will report at least a 40% improvement in symptoms. Baseline:  Goal status: INITIAL     LONG TERM GOALS: Target date: 06/19/2022   Pt will be independent with advanced HEP. Baseline:  Goal status: INITIAL   2.  Pt will improve modified oswestry to 30% or less to demonstrate improvements with functional mobility. Baseline: 60% Goal status: INITIAL   3.  Pt will increase right hip strength to at least 4+/5 to improve ability to transfer her husband. Baseline:  Goal status: INITIAL   4.  Pt will report no increased pain when caring for her husband, including transferring her husband. Baseline:  Goal status: INITIAL  PLAN: PT FREQUENCY: 2x/week   PT DURATION: 8 weeks   PLANNED INTERVENTIONS: Therapeutic exercises, Therapeutic activity, Neuromuscular re-education, Balance training, Gait training, Patient/Family  education, Self Care, Joint mobilization, Joint manipulation, Stair training, Aquatic Therapy, Dry Needling, Electrical stimulation, Cryotherapy, Moist heat, Taping, Ultrasound, Ionotophoresis '4mg'$ /ml Dexamethasone, Manual therapy, and Re-evaluation.   PLAN FOR NEXT SESSION: ask if pt acquired a SPC, assess and progress HEP as indicated, strengthening, flexibility, core stability       Juel Burrow, PT 05/15/2022, 4:29 PM  [facility] [address] [place] [address] [place], [place] [address] Phone # [phone] Fax [phone]

## 2022-05-16 ENCOUNTER — Ambulatory Visit: Payer: Self-pay

## 2022-05-16 NOTE — Patient Outreach (Signed)
  Care Coordination   05/16/2022 Name: Rachel Nichols MRN: [id number] DOB: 1931/07/20   Care Coordination Outreach Attempts:  An unsuccessful telephone outreach was attempted today to offer the patient information about available care coordination services as a benefit of their health plan.   Follow Up Plan:  Additional outreach attempts will be made to offer the patient care coordination information and services.   Encounter Outcome:  No Answer  Care Coordination Interventions Activated:  No   Care Coordination Interventions:  No, not indicated    Daneen Schick, BSW, CDP Social Worker, Certified Dementia Practitioner Care Coordination [phone]

## 2022-05-17 ENCOUNTER — Ambulatory Visit: Payer: Medicare HMO

## 2022-05-17 DIAGNOSIS — R252 Cramp and spasm: Secondary | ICD-10-CM

## 2022-05-17 DIAGNOSIS — R262 Difficulty in walking, not elsewhere classified: Secondary | ICD-10-CM | POA: Diagnosis not present

## 2022-05-17 DIAGNOSIS — R2689 Other abnormalities of gait and mobility: Secondary | ICD-10-CM | POA: Diagnosis not present

## 2022-05-17 DIAGNOSIS — M6281 Muscle weakness (generalized): Secondary | ICD-10-CM

## 2022-05-17 NOTE — Therapy (Signed)
OUTPATIENT PHYSICAL THERAPY TREATMENT NOTE   Patient Name: Rachel Nichols MRN: [id number] DOB:06/08/1931, 88 y.o., female Today's Date: 05/18/2022  PCP: Isaac Bliss, Rayford Halsted, MD REFERRING PROVIDER: Isaac Bliss, Rayford Halsted, MD  END OF SESSION:   PT End of Session - 05/17/22 1533     Visit Number 6    Date for PT Re-Evaluation 06/15/22    Authorization Type [employer]    Progress Note Due on Visit 10    PT Start Time 1533    PT Stop Time 1610    PT Time Calculation (min) 37 min    Activity Tolerance Patient limited by pain    Behavior During Therapy [facility] for tasks assessed/performed             Past Medical History:  Diagnosis Date   Adenomatous colon polyp 2002   Colonoscopy    Benign neoplasm of colon 2004   Colonoscopy   CAD (coronary artery disease)    echo 09/13/10- EF>55%, aortic valve is mildly sclerotic; myoview 02-20-12-no ischemia   Carotid bruit    carotid dopplers 05/16/04- normal   Diverticulosis 2002,2004,2007   Colonoscopy   Duodenitis    Fibrocystic breast disease    Gastritis    GERD (gastroesophageal reflux disease)    Heart attack ([place]) 1993   Cardiolyte stress---  stents 2012   Hiatal hernia    History of blood transfusion    pt denies   Hyperlipidemia    Peptic ulcer due to Helicobacter pylori    PUD (peptic ulcer disease)    SCC (squamous cell carcinoma) 04/24/2011   right jawline (CX35FU), left forearm (CX35FU)   Past Surgical History:  Procedure Laterality Date   [place]     From the nose   CATARACT EXTRACTION  10/26/09   Right eye   CATARACT EXTRACTION  11/09/09   Left eye   CHOLECYSTECTOMY  1984   CORONARY STENT PLACEMENT  01/10/11   hig grade ramus branch stenosis with mod LAD dz, PCI/stenting of ramus branch with a Resolute DES    DILATION AND CURETTAGE OF UTERUS     FINGER SURGERY  04/2011   Cyst removal, right hand forefinger   LEFT OOPHORECTOMY  1957    SQUAMOUS CELL CARCINOMA EXCISION Right 07/24/2018   Posterior forearm shave & ED&C   Patient Active Problem List   Diagnosis Date Noted   IGT (impaired glucose tolerance) 01/12/2021   Hyperlipidemia LDL goal <70 01/13/2015   Murmur 01/13/2015   RUQ abdominal pain 01/22/2014   Pancreatic cyst 01/22/2014   Essential hypertension 03/06/2013   CAD S/P percutaneous coronary angioplasty 04/14/2012   GASTRITIS 10/19/2009   ABDOMINAL PAIN-EPIGASTRIC 10/18/2009   CHOLECYSTECTOMY, HX OF 10/18/2009   DIVERTICULOSIS, COLON 10/14/2009   COLONIC POLYPS, ADENOMATOUS, HX OF 10/14/2009   HIATAL HERNIA WITH REFLUX 09/19/2009   WEIGHT LOSS 09/19/2009   PUD, HX OF 09/19/2009   BACK PAIN 05/27/2008    REFERRING DIAG: M54.31 (ICD-10-CM) - Right sided sciatica  THERAPY DIAG:  Muscle weakness (generalized)  Other abnormalities of gait and mobility  Cramp and spasm  Difficulty in walking, not elsewhere classified  Rationale for Evaluation and Treatment Rehabilitation  PERTINENT HISTORY: None  PRECAUTIONS: none  SUBJECTIVE: Patient states she does "ok sometimes but the pain is still really bad sometimes"  PAIN:  Are you having pain? Yes: NPRS scale: 7-8/10 Pain location: low back Pain description: aching Aggravating factors:  standing  Relieving factors: rest, meds   OBJECTIVE: (objective measures completed at initial evaluation unless otherwise dated)  OBJECTIVE:    DIAGNOSTIC FINDINGS:  N/A   PATIENT SURVEYS:  Modified Oswestry 30 / 50 = 60.0 %                       SENSATION: Reports numbness and tingling down right leg   MUSCLE LENGTH: Right hamstring tightness noted.   POSTURE: rounded shoulders and forward head   PALPATION: Muscle spasms noted in lumbar multifidi   LUMBAR ROM: Limited at least 50% secondary to pain     LOWER EXTREMITY MMT:     MMT Right eval Left eval  Hip flexion 4- 5  Hip extension 4- 5  Hip abduction 4- 5  Hip adduction      Hip  internal rotation      Hip external rotation      Knee flexion 5 5  Knee extension 5 5  Ankle dorsiflexion      Ankle plantarflexion      Ankle inversion      Ankle eversion       (Blank rows = not tested)   LUMBAR SPECIAL TESTS:  Slump test: Positive   FUNCTIONAL TESTS:  04/24/2022:  5 times sit to stand: 16.2 sec pushing up from thighs with 10/10 pain  05/15/2022:   3 min walk test: 189 ft with SPC.  Pt had to sit down at 2.5 minutes and sat for the last 30 sec of the assessment   GAIT: Distance walked: 75 ft Assistive device utilized: None Level of assistance: Complete Independence Comments: Antalgic gait pattern noted with decreased step length       TODAY'S TREATMENT:  05/17/2022: Nustep level 2 (seat at 8, blue machine), x5 min.  PT present to discuss status Seated hamstring stretch 5 x 10 sec right  Sit to stand 2 x 5  Supine lower trunk rotation x 20 Supine SKTC x 5 each holding 10 sec each Supine DKTC x 5 holding 10 sec each Supine PPT x 10 PPT with 90/90 heel tap PPT with dying bug x 20 PPT with SLR and shoulder flexion with yellow plyo ball Lower trunk rotation x 20  05/15/2022: Nustep level 1 (seat at 8, blue machine), x5 min.  PT present to discuss status Seated:  heel/toe raises, marching, LAQ, hip abduction scissors, hip adduction ball squeeze.  BLE 2x10 Seated core series with red plyoball:  hip to hip, hip to shoulder.  X10 bilat Seated ab sets 2x10 Seated LAQ with 5 counts of sciatic nerve floss with ankle pumps x5 RLE Manual:  soft tissue mobilization to right calf, quads, hamstring, and IT band.  Pt reports decreased pain following. Ambulation around PT gym with SPC and SBA with cuing to look ahead and improve posture instead of looking down at the floor.  05/11/2022: Nustep level 1 (seat at 8, blue machine), x5 min.  PT present to discuss status Practiced log rolling x2 reps Supine hamstring stretch with strap 2x20 sec RLE Supine IT band stretch  with strap 2x20 sec RLE RLE single knee to chest 2x20 sec Lower trunk rotation 5x10 sec bilat Supine posterior pelvic tilt, supine march with PPT 2x10 each Supine ball squeeze 5" hold 1x10 - pt reporting increased pain in R hip.  Supine clams 2x10 with RTB Seated:  heel/toe raises, marching, LAQ,   BLE 2x10 Sit to/from stand with UE 1x5 with 1000gm ball  1x5 without.  Ambulation x52f with increased pain in R hip and antalgic gait pattern.  Addaday to right thigh x10 min Moist heat to R hip for 8 min Long axis and Lateral distraction to tolerance       PATIENT EDUCATION:  Education details: Issued HEP Person educated: Patient Education method: Explanation, Demonstration, and Handouts Education comprehension: verbalized understanding and returned demonstration     HOME EXERCISE PROGRAM: Access Code: [facility] LPURL: [email or web address] Date: 05/01/2022 Prepared by: JCandyce Churn Exercises - Seated Hamstring Stretch with Chair  - 1 x daily - 7 x weekly - 1 sets - 3 reps - 30 sec hold - Seated Sciatic Tensioner  - 1 x daily - 7 x weekly - 1 sets - 10 reps - 5 sec hold - Seated Long Arc Quad  - 1 x daily - 7 x weekly - 2 sets - 10 reps - Supine Posterior Pelvic Tilt  - 1 x daily - 7 x weekly - 2 sets - 10 reps - Supine March  - 1 x daily - 7 x weekly - 2 sets - 10 reps - Supine Lower Trunk Rotation  - 1 x daily - 7 x weekly - 2 sets - 10 reps - Supine Dead Bug with Leg Extension  - 1 x daily - 7 x weekly - 2 sets - 10 repsAccess Code: [email or web address]    ASSESSMENT:   CLINICAL IMPRESSION: Ms MBenkois showing slight progress.  She continues to experience right posterior thigh pain.  She reported decreased pain post treatment today.  We added ice for pain control today but she was only able to tolerate this for 5 min.  Pt continues to require skilled PT to progress towards goal related activities.     OBJECTIVE IMPAIRMENTS difficulty walking, decreased strength,  increased muscle spasms, and pain.    ACTIVITY LIMITATIONS lifting, standing, and locomotion level   PARTICIPATION LIMITATIONS: cleaning, driving, and shopping   PERSONAL FACTORS Age and 1-2 comorbidities: GERD, Squamous Cell Carcinoma  are also affecting patient's functional outcome.    REHAB POTENTIAL: Good   CLINICAL DECISION MAKING: Evolving/moderate complexity   EVALUATION COMPLEXITY: Moderate     GOALS: Goals reviewed with patient? Yes   SHORT TERM GOALS: Target date: 05/15/2022   Pt will be independent with initial HEP. Baseline: Goal status: Ongoing   2.  Pt will report at least a 40% improvement in symptoms. Baseline:  Goal status: INITIAL     LONG TERM GOALS: Target date: 06/19/2022   Pt will be independent with advanced HEP. Baseline:  Goal status: INITIAL   2.  Pt will improve modified oswestry to 30% or less to demonstrate improvements with functional mobility. Baseline: 60% Goal status: INITIAL   3.  Pt will increase right hip strength to at least 4+/5 to improve ability to transfer her husband. Baseline:  Goal status: INITIAL   4.  Pt will report no increased pain when caring for her husband, including transferring her husband. Baseline:  Goal status: INITIAL     PLAN: PT FREQUENCY: 2x/week   PT DURATION: 8 weeks   PLANNED INTERVENTIONS: Therapeutic exercises, Therapeutic activity, Neuromuscular re-education, Balance training, Gait training, Patient/Family education, Self Care, Joint mobilization, Joint manipulation, Stair training, Aquatic Therapy, Dry Needling, Electrical stimulation, Cryotherapy, Moist heat, Taping, Ultrasound, Ionotophoresis '4mg'$ /ml Dexamethasone, Manual therapy, and Re-evaluation.   PLAN FOR NEXT SESSION: ask if pt acquired a SPC, assess and progress HEP as indicated, strengthening, flexibility, core stability  Anderson Malta B. Herby Amick, PT 05/18/22 12:05 AM   [facility] [address]  [place], [place] [address] Phone # [phone] Fax [phone]

## 2022-05-23 ENCOUNTER — Ambulatory Visit: Payer: Medicare HMO | Admitting: Rehabilitative and Restorative Service Providers"

## 2022-05-23 ENCOUNTER — Encounter: Payer: Self-pay | Admitting: Rehabilitative and Restorative Service Providers"

## 2022-05-23 DIAGNOSIS — R252 Cramp and spasm: Secondary | ICD-10-CM | POA: Diagnosis not present

## 2022-05-23 DIAGNOSIS — R2689 Other abnormalities of gait and mobility: Secondary | ICD-10-CM

## 2022-05-23 DIAGNOSIS — R262 Difficulty in walking, not elsewhere classified: Secondary | ICD-10-CM | POA: Diagnosis not present

## 2022-05-23 DIAGNOSIS — M6281 Muscle weakness (generalized): Secondary | ICD-10-CM

## 2022-05-23 NOTE — Therapy (Signed)
OUTPATIENT PHYSICAL THERAPY TREATMENT NOTE   Patient Name: Rachel Nichols MRN: [id number] DOB:10-15-1930, 88 y.o., female Today's Date: 05/23/2022  PCP: Isaac Bliss, Rayford Halsted, MD REFERRING PROVIDER: Isaac Bliss, Rayford Halsted, MD  END OF SESSION:   PT End of Session - 05/23/22 1150     Visit Number 7    Date for PT Re-Evaluation 06/15/22    Authorization Type [employer] Medicare    Progress Note Due on Visit 10    PT Start Time 1147    PT Stop Time 1225    PT Time Calculation (min) 38 min    Activity Tolerance Patient tolerated treatment well    Behavior During Therapy [facility] for tasks assessed/performed             Past Medical History:  Diagnosis Date   Adenomatous colon polyp 2002   Colonoscopy    Benign neoplasm of colon 2004   Colonoscopy   CAD (coronary artery disease)    echo 09/13/10- EF>55%, aortic valve is mildly sclerotic; myoview 02-20-12-no ischemia   Carotid bruit    carotid dopplers 05/16/04- normal   Diverticulosis 2002,2004,2007   Colonoscopy   Duodenitis    Fibrocystic breast disease    Gastritis    GERD (gastroesophageal reflux disease)    Heart attack ([place]) 1993   Cardiolyte stress---  stents 2012   Hiatal hernia    History of blood transfusion    pt denies   Hyperlipidemia    Peptic ulcer due to Helicobacter pylori    PUD (peptic ulcer disease)    SCC (squamous cell carcinoma) 04/24/2011   right jawline (CX35FU), left forearm (CX35FU)   Past Surgical History:  Procedure Laterality Date   [place]     From the nose   CATARACT EXTRACTION  10/26/09   Right eye   CATARACT EXTRACTION  11/09/09   Left eye   CHOLECYSTECTOMY  1984   CORONARY STENT PLACEMENT  01/10/11   hig grade ramus branch stenosis with mod LAD dz, PCI/stenting of ramus branch with a Resolute DES    DILATION AND CURETTAGE OF UTERUS     FINGER SURGERY  04/2011   Cyst removal, right hand forefinger   LEFT OOPHORECTOMY   1957   SQUAMOUS CELL CARCINOMA EXCISION Right 07/24/2018   Posterior forearm shave & ED&C   Patient Active Problem List   Diagnosis Date Noted   IGT (impaired glucose tolerance) 01/12/2021   Hyperlipidemia LDL goal <70 01/13/2015   Murmur 01/13/2015   RUQ abdominal pain 01/22/2014   Pancreatic cyst 01/22/2014   Essential hypertension 03/06/2013   CAD S/P percutaneous coronary angioplasty 04/14/2012   GASTRITIS 10/19/2009   ABDOMINAL PAIN-EPIGASTRIC 10/18/2009   CHOLECYSTECTOMY, HX OF 10/18/2009   DIVERTICULOSIS, COLON 10/14/2009   COLONIC POLYPS, ADENOMATOUS, HX OF 10/14/2009   HIATAL HERNIA WITH REFLUX 09/19/2009   WEIGHT LOSS 09/19/2009   PUD, HX OF 09/19/2009   BACK PAIN 05/27/2008    REFERRING DIAG: M54.31 (ICD-10-CM) - Right sided sciatica  THERAPY DIAG:  Muscle weakness (generalized)  Other abnormalities of gait and mobility  Cramp and spasm  Difficulty in walking, not elsewhere classified  Rationale for Evaluation and Treatment Rehabilitation  PERTINENT HISTORY: None  PRECAUTIONS: none  SUBJECTIVE: Patient reports that she was sore after her PT session, but felt better the next day.  PAIN:  Are you having pain? Yes: NPRS scale: 7-8/10 Pain location: low back Pain description: aching  Aggravating factors: standing  Relieving factors: rest, meds   OBJECTIVE: (objective measures completed at initial evaluation unless otherwise dated)  OBJECTIVE:    DIAGNOSTIC FINDINGS:  N/A   PATIENT SURVEYS:  Modified Oswestry 30 / 50 = 60.0 %                       SENSATION: Reports numbness and tingling down right leg   MUSCLE LENGTH: Right hamstring tightness noted.   POSTURE: rounded shoulders and forward head   PALPATION: Muscle spasms noted in lumbar multifidi   LUMBAR ROM: Limited at least 50% secondary to pain     LOWER EXTREMITY MMT:     MMT Right eval Left eval  Hip flexion 4- 5  Hip extension 4- 5  Hip abduction 4- 5  Hip adduction       Hip internal rotation      Hip external rotation      Knee flexion 5 5  Knee extension 5 5  Ankle dorsiflexion      Ankle plantarflexion      Ankle inversion      Ankle eversion       (Blank rows = not tested)   LUMBAR SPECIAL TESTS:  Slump test: Positive   FUNCTIONAL TESTS:  04/24/2022:  5 times sit to stand: 16.2 sec pushing up from thighs with 10/10 pain  05/15/2022:   3 min walk test: 189 ft with SPC.  Pt had to sit down at 2.5 minutes and sat for the last 30 sec of the assessment   GAIT: Distance walked: 75 ft Assistive device utilized: None Level of assistance: Complete Independence Comments: Antalgic gait pattern noted with decreased step length       TODAY'S TREATMENT:  05/23/2022: Nustep level 3 (seat at 8, blue machine), x5 min.  PT present to discuss status Sit to stand 2 x 5  Seated ab sets 2x10 Supine PPT 2x10 Supine SKTC x 5 each holding 10 sec each Supine DKTC x 5 holding 10 sec each Supine PPT with marching 2x10 bilat Supine lower trunk rotation 2x10 Dying Bug 2x10 bilat Supine hamstring stretch with strap 2x20 sec bilat Seated core series with red plyoball:  hip to hip, hip to shoulder.  X10 bilat Seated:  LAQ and marching with 1.5#, hip adduction ball squeeze, heel/toe raises, clamshells with red tband.  BLE 2x10 each  05/17/2022: Nustep level 2 (seat at 8, blue machine), x5 min.  PT present to discuss status Seated hamstring stretch 5 x 10 sec right  Sit to stand 2 x 5  Supine lower trunk rotation x 20 Supine SKTC x 5 each holding 10 sec each Supine DKTC x 5 holding 10 sec each Supine PPT x 10 PPT with 90/90 heel tap PPT with dying bug x 20 PPT with SLR and shoulder flexion with yellow plyo ball Lower trunk rotation x 20  05/15/2022: Nustep level 1 (seat at 8, blue machine), x5 min.  PT present to discuss status Seated:  heel/toe raises, marching, LAQ, hip abduction scissors, hip adduction ball squeeze.  BLE 2x10 Seated core series with red  plyoball:  hip to hip, hip to shoulder.  X10 bilat Seated ab sets 2x10 Seated LAQ with 5 counts of sciatic nerve floss with ankle pumps x5 RLE Manual:  soft tissue mobilization to right calf, quads, hamstring, and IT band.  Pt reports decreased pain following. Ambulation around PT gym with SPC and SBA with cuing to look ahead and  improve posture instead of looking down at the floor.      PATIENT EDUCATION:  Education details: Issued HEP Person educated: Patient Education method: Explanation, Media planner, and Handouts Education comprehension: verbalized understanding and returned demonstration     HOME EXERCISE PROGRAM: Access Code: [facility] URL: [email or web address] Date: 05/01/2022 Prepared by: Candyce Churn  Exercises - Seated Hamstring Stretch with Chair  - 1 x daily - 7 x weekly - 1 sets - 3 reps - 30 sec hold - Seated Sciatic Tensioner  - 1 x daily - 7 x weekly - 1 sets - 10 reps - 5 sec hold - Seated Long Arc Quad  - 1 x daily - 7 x weekly - 2 sets - 10 reps - Supine Posterior Pelvic Tilt  - 1 x daily - 7 x weekly - 2 sets - 10 reps - Supine March  - 1 x daily - 7 x weekly - 2 sets - 10 reps - Supine Lower Trunk Rotation  - 1 x daily - 7 x weekly - 2 sets - 10 reps - Supine Dead Bug with Leg Extension  - 1 x daily - 7 x weekly - 2 sets - 10 reps    ASSESSMENT:   CLINICAL IMPRESSION: Ms Easom presents to rehabilitation with a [facility] stating that she does feel more balanced using it.  Pt continues to report 7/10 pain, but does state that she is "feeling better" and does not complain of increased pain during visit.  Pt does report fatigue during session and encouraged to take seated recovery periods.  Pt continues to require skilled PT to progress towards goal related activities.     OBJECTIVE IMPAIRMENTS difficulty walking, decreased strength, increased muscle spasms, and pain.    ACTIVITY LIMITATIONS lifting, standing, and locomotion level   PARTICIPATION  LIMITATIONS: cleaning, driving, and shopping   PERSONAL FACTORS Age and 1-2 comorbidities: GERD, Squamous Cell Carcinoma  are also affecting patient's functional outcome.    REHAB POTENTIAL: Good   CLINICAL DECISION MAKING: Evolving/moderate complexity   EVALUATION COMPLEXITY: Moderate     GOALS: Goals reviewed with patient? Yes   SHORT TERM GOALS: Target date: 05/15/2022   Pt will be independent with initial HEP. Baseline: Goal status: Goal Met 05/23/22   2.  Pt will report at least a 40% improvement in symptoms. Baseline:  Goal status: INITIAL     LONG TERM GOALS: Target date: 06/19/2022   Pt will be independent with advanced HEP. Baseline:  Goal status: INITIAL   2.  Pt will improve modified oswestry to 30% or less to demonstrate improvements with functional mobility. Baseline: 60% Goal status: INITIAL   3.  Pt will increase right hip strength to at least 4+/5 to improve ability to transfer her husband. Baseline:  Goal status: INITIAL   4.  Pt will report no increased pain when caring for her husband, including transferring her husband. Baseline:  Goal status: INITIAL     PLAN: PT FREQUENCY: 2x/week   PT DURATION: 8 weeks   PLANNED INTERVENTIONS: Therapeutic exercises, Therapeutic activity, Neuromuscular re-education, Balance training, Gait training, Patient/Family education, Self Care, Joint mobilization, Joint manipulation, Stair training, Aquatic Therapy, Dry Needling, Electrical stimulation, Cryotherapy, Moist heat, Taping, Ultrasound, Ionotophoresis 30m/ml Dexamethasone, Manual therapy, and Re-evaluation.   PLAN FOR NEXT SESSION: assess and progress HEP as indicated, strengthening, flexibility, core stability       SJuel Burrow PT 05/23/22 12:41 PM   [facility] [address] [place] [address]  Phone # [phone] Fax [phone]

## 2022-05-24 ENCOUNTER — Ambulatory Visit: Payer: Self-pay

## 2022-05-24 NOTE — Patient Outreach (Signed)
  Care Coordination   05/24/2022 Name: Rachel Nichols MRN: [id number] DOB: 03-02-1931   Care Coordination Outreach Attempts:  A second unsuccessful outreach was attempted today to offer the patient with information about available care coordination services as a benefit of their health plan.     Follow Up Plan:  Additional outreach attempts will be made to offer the patient care coordination information and services.   Encounter Outcome:  No Answer  Care Coordination Interventions Activated:  No   Care Coordination Interventions:  No, not indicated    Daneen Schick, BSW, CDP Social Worker, Certified Dementia Practitioner Care Coordination [phone]

## 2022-05-25 ENCOUNTER — Encounter: Payer: Self-pay | Admitting: Rehabilitative and Restorative Service Providers"

## 2022-05-25 ENCOUNTER — Ambulatory Visit: Payer: Medicare HMO | Admitting: Rehabilitative and Restorative Service Providers"

## 2022-05-25 DIAGNOSIS — R252 Cramp and spasm: Secondary | ICD-10-CM | POA: Diagnosis not present

## 2022-05-25 DIAGNOSIS — R2689 Other abnormalities of gait and mobility: Secondary | ICD-10-CM

## 2022-05-25 DIAGNOSIS — R262 Difficulty in walking, not elsewhere classified: Secondary | ICD-10-CM | POA: Diagnosis not present

## 2022-05-25 DIAGNOSIS — M6281 Muscle weakness (generalized): Secondary | ICD-10-CM | POA: Diagnosis not present

## 2022-05-25 NOTE — Therapy (Signed)
OUTPATIENT PHYSICAL THERAPY TREATMENT NOTE   Patient Name: Rachel Nichols MRN: [id number] DOB:12/15/1930, 88 y.o., female Today's Date: 05/25/2022  PCP: Isaac Bliss, Rayford Halsted, MD REFERRING PROVIDER: Isaac Bliss, Rayford Halsted, MD  END OF SESSION:   PT End of Session - 05/25/22 1103     Visit Number 8    Date for PT Re-Evaluation 06/15/22    Authorization Type [employer]    Progress Note Due on Visit 10    PT Start Time 1100    PT Stop Time 1140    PT Time Calculation (min) 40 min    Activity Tolerance Patient tolerated treatment well    Behavior During Therapy [facility] for tasks assessed/performed             Past Medical History:  Diagnosis Date   Adenomatous colon polyp 2002   Colonoscopy    Benign neoplasm of colon 2004   Colonoscopy   CAD (coronary artery disease)    echo 09/13/10- EF>55%, aortic valve is mildly sclerotic; myoview 02-20-12-no ischemia   Carotid bruit    carotid dopplers 05/16/04- normal   Diverticulosis 2002,2004,2007   Colonoscopy   Duodenitis    Fibrocystic breast disease    Gastritis    GERD (gastroesophageal reflux disease)    Heart attack ([place]) 1993   Cardiolyte stress---  stents 2012   Hiatal hernia    History of blood transfusion    pt denies   Hyperlipidemia    Peptic ulcer due to Helicobacter pylori    PUD (peptic ulcer disease)    SCC (squamous cell carcinoma) 04/24/2011   right jawline (CX35FU), left forearm (CX35FU)   Past Surgical History:  Procedure Laterality Date   [place]     From the nose   CATARACT EXTRACTION  10/26/09   Right eye   CATARACT EXTRACTION  11/09/09   Left eye   CHOLECYSTECTOMY  1984   CORONARY STENT PLACEMENT  01/10/11   hig grade ramus branch stenosis with mod LAD dz, PCI/stenting of ramus branch with a Resolute DES    DILATION AND CURETTAGE OF UTERUS     FINGER SURGERY  04/2011   Cyst removal, right hand forefinger   LEFT OOPHORECTOMY   1957   SQUAMOUS CELL CARCINOMA EXCISION Right 07/24/2018   Posterior forearm shave & ED&C   Patient Active Problem List   Diagnosis Date Noted   IGT (impaired glucose tolerance) 01/12/2021   Hyperlipidemia LDL goal <70 01/13/2015   Murmur 01/13/2015   RUQ abdominal pain 01/22/2014   Pancreatic cyst 01/22/2014   Essential hypertension 03/06/2013   CAD S/P percutaneous coronary angioplasty 04/14/2012   GASTRITIS 10/19/2009   ABDOMINAL PAIN-EPIGASTRIC 10/18/2009   CHOLECYSTECTOMY, HX OF 10/18/2009   DIVERTICULOSIS, COLON 10/14/2009   COLONIC POLYPS, ADENOMATOUS, HX OF 10/14/2009   HIATAL HERNIA WITH REFLUX 09/19/2009   WEIGHT LOSS 09/19/2009   PUD, HX OF 09/19/2009   BACK PAIN 05/27/2008    REFERRING DIAG: M54.31 (ICD-10-CM) - Right sided sciatica  THERAPY DIAG:  Muscle weakness (generalized)  Other abnormalities of gait and mobility  Cramp and spasm  Difficulty in walking, not elsewhere classified  Rationale for Evaluation and Treatment Rehabilitation  PERTINENT HISTORY: None  PRECAUTIONS: none  SUBJECTIVE: Patient reports that she was a little sore following the last session, but "got along just fine".  Pt states that this morning she woke up with increased pain.  PAIN:  Are you  having pain? Yes: NPRS scale: 8/10 Pain location: low back Pain description: aching Aggravating factors: standing  Relieving factors: rest, meds   OBJECTIVE: (objective measures completed at initial evaluation unless otherwise dated)  OBJECTIVE:    DIAGNOSTIC FINDINGS:  N/A   PATIENT SURVEYS:  Modified Oswestry 30 / 50 = 60.0 %                       SENSATION: Reports numbness and tingling down right leg   MUSCLE LENGTH: Right hamstring tightness noted.   POSTURE: rounded shoulders and forward head   PALPATION: Muscle spasms noted in lumbar multifidi   LUMBAR ROM: Limited at least 50% secondary to pain     LOWER EXTREMITY MMT:     MMT Right eval Left eval  Hip  flexion 4- 5  Hip extension 4- 5  Hip abduction 4- 5  Hip adduction      Hip internal rotation      Hip external rotation      Knee flexion 5 5  Knee extension 5 5  Ankle dorsiflexion      Ankle plantarflexion      Ankle inversion      Ankle eversion       (Blank rows = not tested)   LUMBAR SPECIAL TESTS:  Slump test: Positive   FUNCTIONAL TESTS:  04/24/2022:  5 times sit to stand: 16.2 sec pushing up from thighs with 10/10 pain  05/15/2022:   3 min walk test: 189 ft with SPC.  Pt had to sit down at 2.5 minutes and sat for the last 30 sec of the assessment  05/25/2022: 3 min walk test: 284 ft with SPC.    GAIT: Distance walked: 75 ft Assistive device utilized: None Level of assistance: Complete Independence Comments: Antalgic gait pattern noted with decreased step length       TODAY'S TREATMENT:  05/25/2022: Nustep level 3 (seat at 8, green machine), x6 min.  PT present to discuss status Amb 3 min with SPC around PT gym for 3 minutes Seated:  LAQ and marching with 1.5#, hip adduction ball squeeze, heel/toe raises, clamshells with red tband.  BLE 2x10 each Sit to stand 2 x 5  Seated piriformis stretch 2x20 sec bilat Seated hamstring stretch 2x20 sec bilat Addaday to right quad, IT band, and hamstring x8 min Seated ab sets 2x10 Seated pelvic tilts 2x10  05/23/2022: Nustep level 3 (seat at 8, blue machine), x5 min.  PT present to discuss status Sit to stand 2 x 5  Seated ab sets 2x10 Supine PPT 2x10 Supine SKTC x 5 each holding 10 sec each Supine DKTC x 5 holding 10 sec each Supine PPT with marching 2x10 bilat Supine lower trunk rotation 2x10 Dying Bug 2x10 bilat Supine hamstring stretch with strap 2x20 sec bilat Seated core series with red plyoball:  hip to hip, hip to shoulder.  X10 bilat Seated:  LAQ and marching with 1.5#, hip adduction ball squeeze, heel/toe raises, clamshells with red tband.  BLE 2x10 each  05/17/2022: Nustep level 2 (seat at 8, blue  machine), x5 min.  PT present to discuss status Seated hamstring stretch 5 x 10 sec right  Sit to stand 2 x 5  Supine lower trunk rotation x 20 Supine SKTC x 5 each holding 10 sec each Supine DKTC x 5 holding 10 sec each Supine PPT x 10 PPT with 90/90 heel tap PPT with dying bug x 20 PPT with SLR and shoulder  flexion with yellow plyo ball Lower trunk rotation x 20      PATIENT EDUCATION:  Education details: Issued HEP Person educated: Patient Education method: Explanation, Demonstration, and Handouts Education comprehension: verbalized understanding and returned demonstration     HOME EXERCISE PROGRAM: Access Code: [facility] URL: [email or web address] Date: 05/01/2022 Prepared by: Candyce Churn  Exercises - Seated Hamstring Stretch with Chair  - 1 x daily - 7 x weekly - 1 sets - 3 reps - 30 sec hold - Seated Sciatic Tensioner  - 1 x daily - 7 x weekly - 1 sets - 10 reps - 5 sec hold - Seated Long Arc Quad  - 1 x daily - 7 x weekly - 2 sets - 10 reps - Supine Posterior Pelvic Tilt  - 1 x daily - 7 x weekly - 2 sets - 10 reps - Supine March  - 1 x daily - 7 x weekly - 2 sets - 10 reps - Supine Lower Trunk Rotation  - 1 x daily - 7 x weekly - 2 sets - 10 reps - Supine Dead Bug with Leg Extension  - 1 x daily - 7 x weekly - 2 sets - 10 reps    ASSESSMENT:   CLINICAL IMPRESSION: Ms Fayad presents to rehabilitation reporting increased pain today, but does state that the can is helping with her walking.  Pt reports decreased pain following use of addaday and demonstrates increased distance with ambulation during 3 minute walk test today.  Pt requires less cuing today with seated pelvic tilts and better able to isolate movement.  Pt continues to require skilled PT to progress towards goal related activities.     OBJECTIVE IMPAIRMENTS difficulty walking, decreased strength, increased muscle spasms, and pain.    ACTIVITY LIMITATIONS lifting, standing, and locomotion  level   PARTICIPATION LIMITATIONS: cleaning, driving, and shopping   PERSONAL FACTORS Age and 1-2 comorbidities: GERD, Squamous Cell Carcinoma  are also affecting patient's functional outcome.    REHAB POTENTIAL: Good   CLINICAL DECISION MAKING: Evolving/moderate complexity   EVALUATION COMPLEXITY: Moderate     GOALS: Goals reviewed with patient? Yes   SHORT TERM GOALS: Target date: 05/15/2022   Pt will be independent with initial HEP. Baseline: Goal status: Goal Met 05/23/22   2.  Pt will report at least a 40% improvement in symptoms. Baseline:  Goal status: Ongoing     LONG TERM GOALS: Target date: 06/19/2022   Pt will be independent with advanced HEP. Baseline:  Goal status: INITIAL   2.  Pt will improve modified oswestry to 30% or less to demonstrate improvements with functional mobility. Baseline: 60% Goal status: INITIAL   3.  Pt will increase right hip strength to at least 4+/5 to improve ability to transfer her husband. Baseline:  Goal status: INITIAL   4.  Pt will report no increased pain when caring for her husband, including transferring her husband. Baseline:  Goal status: INITIAL     PLAN: PT FREQUENCY: 2x/week   PT DURATION: 8 weeks   PLANNED INTERVENTIONS: Therapeutic exercises, Therapeutic activity, Neuromuscular re-education, Balance training, Gait training, Patient/Family education, Self Care, Joint mobilization, Joint manipulation, Stair training, Aquatic Therapy, Dry Needling, Electrical stimulation, Cryotherapy, Moist heat, Taping, Ultrasound, Ionotophoresis 4mg /ml Dexamethasone, Manual therapy, and Re-evaluation.   PLAN FOR NEXT SESSION: assess and progress HEP as indicated, strengthening, flexibility, core stability       Juel Burrow, PT 05/25/22 11:53 AM   [facility] [address]  [place], [place] [address] Phone # [phone] Fax [phone]

## 2022-05-28 ENCOUNTER — Other Ambulatory Visit: Payer: Self-pay | Admitting: *Deleted

## 2022-05-28 NOTE — Patient Outreach (Signed)
  Care Coordination   05/28/2022 Name: Rachel Nichols MRN: [id number] DOB: August 22, 1931   Care Coordination Outreach Attempts:  An unsuccessful telephone outreach was attempted today to offer the patient information about available care coordination services as a benefit of their health plan.   Follow Up Plan:  Additional outreach attempts will be made to offer the patient care coordination information and services.   Encounter Outcome:  No Answer  Care Coordination Interventions Activated:  No   Care Coordination Interventions:  No, not indicated   Raina Mina, RN Care Management Coordinator [place] Office [phone]

## 2022-05-29 ENCOUNTER — Encounter: Payer: Self-pay | Admitting: Rehabilitative and Restorative Service Providers"

## 2022-05-29 ENCOUNTER — Ambulatory Visit: Payer: Medicare HMO | Admitting: Rehabilitative and Restorative Service Providers"

## 2022-05-29 DIAGNOSIS — R2689 Other abnormalities of gait and mobility: Secondary | ICD-10-CM

## 2022-05-29 DIAGNOSIS — M6281 Muscle weakness (generalized): Secondary | ICD-10-CM | POA: Diagnosis not present

## 2022-05-29 DIAGNOSIS — R262 Difficulty in walking, not elsewhere classified: Secondary | ICD-10-CM | POA: Diagnosis not present

## 2022-05-29 DIAGNOSIS — R252 Cramp and spasm: Secondary | ICD-10-CM | POA: Diagnosis not present

## 2022-05-29 NOTE — Therapy (Signed)
OUTPATIENT PHYSICAL THERAPY TREATMENT NOTE   Patient Name: MARVIS BAKKEN MRN: [id number] DOB:13-Dec-1930, 88 y.o., female Today's Date: 05/29/2022  PCP: Isaac Bliss, Rayford Halsted, MD REFERRING PROVIDER: Isaac Bliss, Rayford Halsted, MD  END OF SESSION:   PT End of Session - 05/29/22 1018     Visit Number 9    Date for PT Re-Evaluation 06/15/22    Authorization Type [employer]    Progress Note Due on Visit 10    PT Start Time 1015    PT Stop Time 1045    PT Time Calculation (min) 30 min    Activity Tolerance Patient limited by pain    Behavior During Therapy [facility] for tasks assessed/performed             Past Medical History:  Diagnosis Date   Adenomatous colon polyp 2002   Colonoscopy    Benign neoplasm of colon 2004   Colonoscopy   CAD (coronary artery disease)    echo 09/13/10- EF>55%, aortic valve is mildly sclerotic; myoview 02-20-12-no ischemia   Carotid bruit    carotid dopplers 05/16/04- normal   Diverticulosis 2002,2004,2007   Colonoscopy   Duodenitis    Fibrocystic breast disease    Gastritis    GERD (gastroesophageal reflux disease)    Heart attack ([place]) 1993   Cardiolyte stress---  stents 2012   Hiatal hernia    History of blood transfusion    pt denies   Hyperlipidemia    Peptic ulcer due to Helicobacter pylori    PUD (peptic ulcer disease)    SCC (squamous cell carcinoma) 04/24/2011   right jawline (CX35FU), left forearm (CX35FU)   Past Surgical History:  Procedure Laterality Date   [place]     From the nose   CATARACT EXTRACTION  10/26/09   Right eye   CATARACT EXTRACTION  11/09/09   Left eye   CHOLECYSTECTOMY  1984   CORONARY STENT PLACEMENT  01/10/11   hig grade ramus branch stenosis with mod LAD dz, PCI/stenting of ramus branch with a Resolute DES    DILATION AND CURETTAGE OF UTERUS     FINGER SURGERY  04/2011   Cyst removal, right hand forefinger   LEFT OOPHORECTOMY  1957    SQUAMOUS CELL CARCINOMA EXCISION Right 07/24/2018   Posterior forearm shave & ED&C   Patient Active Problem List   Diagnosis Date Noted   IGT (impaired glucose tolerance) 01/12/2021   Hyperlipidemia LDL goal <70 01/13/2015   Murmur 01/13/2015   RUQ abdominal pain 01/22/2014   Pancreatic cyst 01/22/2014   Essential hypertension 03/06/2013   CAD S/P percutaneous coronary angioplasty 04/14/2012   GASTRITIS 10/19/2009   ABDOMINAL PAIN-EPIGASTRIC 10/18/2009   CHOLECYSTECTOMY, HX OF 10/18/2009   DIVERTICULOSIS, COLON 10/14/2009   COLONIC POLYPS, ADENOMATOUS, HX OF 10/14/2009   HIATAL HERNIA WITH REFLUX 09/19/2009   WEIGHT LOSS 09/19/2009   PUD, HX OF 09/19/2009   BACK PAIN 05/27/2008    REFERRING DIAG: M54.31 (ICD-10-CM) - Right sided sciatica  THERAPY DIAG:  Muscle weakness (generalized)  Other abnormalities of gait and mobility  Cramp and spasm  Difficulty in walking, not elsewhere classified  Rationale for Evaluation and Treatment Rehabilitation  PERTINENT HISTORY: None  PRECAUTIONS: none  SUBJECTIVE: Patient reports that she was a little sore following the last session, but "got along just fine".  Pt states that this morning she woke up with increased pain.  PAIN:  Are you  having pain? Yes: NPRS scale: 8/10 Pain location: low back Pain description: aching Aggravating factors: standing  Relieving factors: rest, meds   OBJECTIVE: (objective measures completed at initial evaluation unless otherwise dated)  OBJECTIVE:    DIAGNOSTIC FINDINGS:  N/A   PATIENT SURVEYS:  Modified Oswestry 30 / 50 = 60.0 %                       SENSATION: Reports numbness and tingling down right leg   MUSCLE LENGTH: Right hamstring tightness noted.   POSTURE: rounded shoulders and forward head   PALPATION: Muscle spasms noted in lumbar multifidi   LUMBAR ROM: Limited at least 50% secondary to pain     LOWER EXTREMITY MMT:     MMT Right eval Left eval  Hip flexion 4-  5  Hip extension 4- 5  Hip abduction 4- 5  Hip adduction      Hip internal rotation      Hip external rotation      Knee flexion 5 5  Knee extension 5 5  Ankle dorsiflexion      Ankle plantarflexion      Ankle inversion      Ankle eversion       (Blank rows = not tested)   LUMBAR SPECIAL TESTS:  Slump test: Positive   FUNCTIONAL TESTS:  04/24/2022:  5 times sit to stand: 16.2 sec pushing up from thighs with 10/10 pain  05/15/2022:   3 min walk test: 189 ft with SPC.  Pt had to sit down at 2.5 minutes and sat for the last 30 sec of the assessment  05/25/2022: 3 min walk test: 284 ft with SPC.    GAIT: Distance walked: 75 ft Assistive device utilized: None Level of assistance: Complete Independence Comments: Antalgic gait pattern noted with decreased step length       TODAY'S TREATMENT:  05/29/2022: Nustep level 3 (seat at 8, green machine), x6 min.  PT present to discuss status Seated:  LAQ and marching with 1.5#, hip adduction ball squeeze, heel/toe raises, clamshells with red tband.  BLE 2x10 each Supine PPT 2x10 Supine lower trunk rotation 2x10 Supine SKTC x 5 each holding 10 sec each  05/25/2022: Nustep level 3 (seat at 8, green machine), x6 min.  PT present to discuss status Amb 3 min with SPC around PT gym for 3 minutes Seated:  LAQ and marching with 1.5#, hip adduction ball squeeze, heel/toe raises, clamshells with red tband.  BLE 2x10 each Sit to stand 2 x 5  Seated piriformis stretch 2x20 sec bilat Seated hamstring stretch 2x20 sec bilat Addaday to right quad, IT band, and hamstring x8 min Seated ab sets 2x10 Seated pelvic tilts 2x10  05/23/2022: Nustep level 3 (seat at 8, blue machine), x5 min.  PT present to discuss status Sit to stand 2 x 5  Seated ab sets 2x10 Supine PPT 2x10 Supine SKTC x 5 each holding 10 sec each Supine DKTC x 5 holding 10 sec each Supine PPT with marching 2x10 bilat Supine lower trunk rotation 2x10 Dying Bug 2x10 bilat Supine  hamstring stretch with strap 2x20 sec bilat Seated core series with red plyoball:  hip to hip, hip to shoulder.  X10 bilat Seated:  LAQ and marching with 1.5#, hip adduction ball squeeze, heel/toe raises, clamshells with red tband.  BLE 2x10 each      PATIENT EDUCATION:  Education details: Issued HEP Person educated: Patient Education method: Explanation, Demonstration, and Handouts  Education comprehension: verbalized understanding and returned demonstration     HOME EXERCISE PROGRAM: Access Code: [facility] URL: [email or web address] Date: 05/01/2022 Prepared by: Candyce Churn  Exercises - Seated Hamstring Stretch with Chair  - 1 x daily - 7 x weekly - 1 sets - 3 reps - 30 sec hold - Seated Sciatic Tensioner  - 1 x daily - 7 x weekly - 1 sets - 10 reps - 5 sec hold - Seated Long Arc Quad  - 1 x daily - 7 x weekly - 2 sets - 10 reps - Supine Posterior Pelvic Tilt  - 1 x daily - 7 x weekly - 2 sets - 10 reps - Supine March  - 1 x daily - 7 x weekly - 2 sets - 10 reps - Supine Lower Trunk Rotation  - 1 x daily - 7 x weekly - 2 sets - 10 reps - Supine Dead Bug with Leg Extension  - 1 x daily - 7 x weekly - 2 sets - 10 reps    ASSESSMENT:   CLINICAL IMPRESSION: Ms Laufer presents to rehabilitation reporting continued pain.  Pt able to progress throughout session with some cuing for technique to decrease pain.  Pt with some GI discomfort during session and needed to end early secondary to the GI discomfort.  Pt reports that she will continue with HEP.  Pt continues to require skilled PT to progress through goal related activities.     OBJECTIVE IMPAIRMENTS difficulty walking, decreased strength, increased muscle spasms, and pain.    ACTIVITY LIMITATIONS lifting, standing, and locomotion level   PARTICIPATION LIMITATIONS: cleaning, driving, and shopping   PERSONAL FACTORS Age and 1-2 comorbidities: GERD, Squamous Cell Carcinoma  are also affecting patient's functional  outcome.    REHAB POTENTIAL: Good   CLINICAL DECISION MAKING: Evolving/moderate complexity   EVALUATION COMPLEXITY: Moderate     GOALS: Goals reviewed with patient? Yes   SHORT TERM GOALS: Target date: 05/15/2022   Pt will be independent with initial HEP. Baseline: Goal status: Goal Met 05/23/22   2.  Pt will report at least a 40% improvement in symptoms. Baseline:  Goal status: Ongoing     LONG TERM GOALS: Target date: 06/19/2022   Pt will be independent with advanced HEP. Baseline:  Goal status: INITIAL   2.  Pt will improve modified oswestry to 30% or less to demonstrate improvements with functional mobility. Baseline: 60% Goal status: INITIAL   3.  Pt will increase right hip strength to at least 4+/5 to improve ability to transfer her husband. Baseline:  Goal status: INITIAL   4.  Pt will report no increased pain when caring for her husband, including transferring her husband. Baseline:  Goal status: INITIAL     PLAN: PT FREQUENCY: 2x/week   PT DURATION: 8 weeks   PLANNED INTERVENTIONS: Therapeutic exercises, Therapeutic activity, Neuromuscular re-education, Balance training, Gait training, Patient/Family education, Self Care, Joint mobilization, Joint manipulation, Stair training, Aquatic Therapy, Dry Needling, Electrical stimulation, Cryotherapy, Moist heat, Taping, Ultrasound, Ionotophoresis 70m/ml Dexamethasone, Manual therapy, and Re-evaluation.   PLAN FOR NEXT SESSION: assess and progress HEP as indicated, strengthening, flexibility, core stability       SJuel Burrow PT 05/29/22 10:52 AM   [facility] [address] [place] [address]Phone # [phone] [phone]

## 2022-06-01 ENCOUNTER — Ambulatory Visit: Payer: Medicare HMO | Admitting: Rehabilitative and Restorative Service Providers"

## 2022-06-01 ENCOUNTER — Encounter: Payer: Self-pay | Admitting: Rehabilitative and Restorative Service Providers"

## 2022-06-01 DIAGNOSIS — M6281 Muscle weakness (generalized): Secondary | ICD-10-CM | POA: Diagnosis not present

## 2022-06-01 DIAGNOSIS — R262 Difficulty in walking, not elsewhere classified: Secondary | ICD-10-CM | POA: Diagnosis not present

## 2022-06-01 DIAGNOSIS — R2689 Other abnormalities of gait and mobility: Secondary | ICD-10-CM

## 2022-06-01 DIAGNOSIS — R252 Cramp and spasm: Secondary | ICD-10-CM

## 2022-06-01 NOTE — Therapy (Signed)
OUTPATIENT PHYSICAL THERAPY TREATMENT NOTE   Patient Name: Rachel Nichols MRN: [id number] DOB:05/12/1931, 88 y.o., female Today's Date: 06/01/2022  PCP: Isaac Bliss, Rayford Halsted, MD REFERRING PROVIDER: Isaac Bliss, Rayford Halsted, MD   Progress Note Reporting Period 04/24/2022 to 06/01/2022  See note below for Objective Data and Assessment of Progress/Goals.      END OF SESSION:   PT End of Session - 06/01/22 1018     Visit Number 10    Date for PT Re-Evaluation 06/15/22    Authorization Type [employer]    Progress Note Due on Visit 20    PT Start Time 1015    PT Stop Time 1055    PT Time Calculation (min) 40 min    Activity Tolerance Patient limited by pain    Behavior During Therapy [facility] for tasks assessed/performed             Past Medical History:  Diagnosis Date   Adenomatous colon polyp 2002   Colonoscopy    Benign neoplasm of colon 2004   Colonoscopy   CAD (coronary artery disease)    echo 09/13/10- EF>55%, aortic valve is mildly sclerotic; myoview 02-20-12-no ischemia   Carotid bruit    carotid dopplers 05/16/04- normal   Diverticulosis 2002,2004,2007   Colonoscopy   Duodenitis    Fibrocystic breast disease    Gastritis    GERD (gastroesophageal reflux disease)    Heart attack ([place]) 1993   Cardiolyte stress---  stents 2012   Hiatal hernia    History of blood transfusion    pt denies   Hyperlipidemia    Peptic ulcer due to Helicobacter pylori    PUD (peptic ulcer disease)    SCC (squamous cell carcinoma) 04/24/2011   right jawline (CX35FU), left forearm (CX35FU)   Past Surgical History:  Procedure Laterality Date   [place]   BASAL CELL CARCINOMA EXCISION     From the nose   CATARACT EXTRACTION  10/26/09   Right eye   CATARACT EXTRACTION  11/09/09   Left eye   CHOLECYSTECTOMY  1984   CORONARY STENT PLACEMENT  01/10/11   hig grade ramus branch stenosis with mod LAD dz, PCI/stenting of ramus branch with a Resolute  DES    DILATION AND CURETTAGE OF UTERUS     FINGER SURGERY  04/2011   Cyst removal, right hand forefinger   LEFT OOPHORECTOMY  1957   SQUAMOUS CELL CARCINOMA EXCISION Right 07/24/2018   Posterior forearm shave & ED&C   Patient Active Problem List   Diagnosis Date Noted   IGT (impaired glucose tolerance) 01/12/2021   Hyperlipidemia LDL goal <70 01/13/2015   Murmur 01/13/2015   RUQ abdominal pain 01/22/2014   Pancreatic cyst 01/22/2014   Essential hypertension 03/06/2013   CAD S/P percutaneous coronary angioplasty 04/14/2012   GASTRITIS 10/19/2009   ABDOMINAL PAIN-EPIGASTRIC 10/18/2009   CHOLECYSTECTOMY, HX OF 10/18/2009   DIVERTICULOSIS, COLON 10/14/2009   COLONIC POLYPS, ADENOMATOUS, HX OF 10/14/2009   HIATAL HERNIA WITH REFLUX 09/19/2009   WEIGHT LOSS 09/19/2009   PUD, HX OF 09/19/2009   BACK PAIN 05/27/2008    REFERRING DIAG: M54.31 (ICD-10-CM) - Right sided sciatica  THERAPY DIAG:  Muscle weakness (generalized)  Other abnormalities of gait and mobility  Cramp and spasm  Difficulty in walking, not elsewhere classified  Rationale for Evaluation and Treatment Rehabilitation  PERTINENT HISTORY: None  PRECAUTIONS: none  SUBJECTIVE: Patient reports that she was a little sore following the  last session, but "got along just fine".  Pt states that this morning she woke up with increased pain.  PAIN:  Are you having pain? Yes: NPRS scale: 7/10 Pain location: low back Pain description: aching Aggravating factors: standing  Relieving factors: rest, meds   OBJECTIVE: (objective measures completed at initial evaluation unless otherwise dated)  OBJECTIVE:    DIAGNOSTIC FINDINGS:  N/A   PATIENT SURVEYS:  04/24/22:  Modified Oswestry 30 / 50 = 60.0 %           06/01/22:  Modified Oswestry Low Back Pain Disability Questionnaire: 28 / 50 = 56.0 %             SENSATION: Reports numbness and tingling down right leg   MUSCLE LENGTH: Right hamstring tightness noted.    POSTURE: rounded shoulders and forward head   PALPATION: Muscle spasms noted in lumbar multifidi   LUMBAR ROM: Limited at least 50% secondary to pain     LOWER EXTREMITY MMT:     MMT Right eval Left eval  Hip flexion 4- 5  Hip extension 4- 5  Hip abduction 4- 5  Hip adduction      Hip internal rotation      Hip external rotation      Knee flexion 5 5  Knee extension 5 5  Ankle dorsiflexion      Ankle plantarflexion      Ankle inversion      Ankle eversion       (Blank rows = not tested)   LUMBAR SPECIAL TESTS:  Slump test: Positive   FUNCTIONAL TESTS:  04/24/2022:  5 times sit to stand: 16.2 sec pushing up from thighs with 10/10 pain  05/15/2022:   3 min walk test: 189 ft with SPC.  Pt had to sit down at 2.5 minutes and sat for the last 30 sec of the assessment  05/25/2022: 3 min walk test: 284 ft with SPC.   06/01/22: 5 times sit to/from stand:  13.9 sec with UE pushing up from thighs and reports of 7/10 pain TUG: 12.4 sec with SPC   GAIT: Distance walked: 75 ft Assistive device utilized: None Level of assistance: Complete Independence Comments: Antalgic gait pattern noted with decreased step length       TODAY'S TREATMENT:  06/01/2022: Nustep level 3 (seat at 7, green machine), x6 min.  PT present to discuss status Seated:  LAQ and marching with 1.5#, hip adduction ball squeeze, heel/toe raises, clamshells with red tband.  BLE 2x10 each Objective testing for 10th visit assessment (see above) Seated HS curls with red tband 2x10 bilat Supine PPT 2x10 Supine SKTC x 5 each holding 10 sec each Lower trunk rotation 2x10 Supine hamstring stretch with strap  2x20 sec bilat   05/29/2022: Nustep level 3 (seat at 8, green machine), x6 min.  PT present to discuss status Seated:  LAQ and marching with 1.5#, hip adduction ball squeeze, heel/toe raises, clamshells with red tband.  BLE 2x10 each Supine PPT 2x10 Supine lower trunk rotation 2x10 Supine SKTC x 5 each  holding 10 sec each  05/25/2022: Nustep level 3 (seat at 8, green machine), x6 min.  PT present to discuss status Amb 3 min with SPC around PT gym for 3 minutes Seated:  LAQ and marching with 1.5#, hip adduction ball squeeze, heel/toe raises, clamshells with red tband.  BLE 2x10 each Sit to stand 2 x 5  Seated piriformis stretch 2x20 sec bilat Seated hamstring stretch 2x20 sec bilat  Addaday to right quad, IT band, and hamstring x8 min Seated ab sets 2x10 Seated pelvic tilts 2x10      PATIENT EDUCATION:  Education details: Issued HEP Person educated: Patient Education method: Explanation, Demonstration, and Handouts Education comprehension: verbalized understanding and returned demonstration     HOME EXERCISE PROGRAM: Access Code: [facility] URL: [email or web address] Date: 05/01/2022 Prepared by: Candyce Churn  Exercises - Seated Hamstring Stretch with Chair  - 1 x daily - 7 x weekly - 1 sets - 3 reps - 30 sec hold - Seated Sciatic Tensioner  - 1 x daily - 7 x weekly - 1 sets - 10 reps - 5 sec hold - Seated Long Arc Quad  - 1 x daily - 7 x weekly - 2 sets - 10 reps - Supine Posterior Pelvic Tilt  - 1 x daily - 7 x weekly - 2 sets - 10 reps - Supine March  - 1 x daily - 7 x weekly - 2 sets - 10 reps - Supine Lower Trunk Rotation  - 1 x daily - 7 x weekly - 2 sets - 10 reps - Supine Dead Bug with Leg Extension  - 1 x daily - 7 x weekly - 2 sets - 10 reps    ASSESSMENT:   CLINICAL IMPRESSION: Rachel Nichols presents to rehabilitation reporting continued pain, but states that she feels at least 50% better since starting skilled PT.  Pt tolerated session well with improvements noted on 5 times sit to/from stand and modified oswestry. Pt is progressing with goal related activities and has met all short term goals.  Pt continues to require skilled PT to progress through goal related activities.     OBJECTIVE IMPAIRMENTS difficulty walking, decreased strength, increased  muscle spasms, and pain.    ACTIVITY LIMITATIONS lifting, standing, and locomotion level   PARTICIPATION LIMITATIONS: cleaning, driving, and shopping   PERSONAL FACTORS Age and 1-2 comorbidities: GERD, Squamous Cell Carcinoma  are also affecting patient's functional outcome.    REHAB POTENTIAL: Good   CLINICAL DECISION MAKING: Evolving/moderate complexity   EVALUATION COMPLEXITY: Moderate     GOALS: Goals reviewed with patient? Yes   SHORT TERM GOALS: Target date: 05/15/2022   Pt will be independent with initial HEP. Baseline: Goal status: Goal Met 05/23/22   2.  Pt will report at least a 40% improvement in symptoms. Baseline:  Goal status: Goal Met 06/01/22     LONG TERM GOALS: Target date: 06/19/2022   Pt will be independent with advanced HEP. Baseline:  Goal status: INITIAL   2.  Pt will improve modified oswestry to 30% or less to demonstrate improvements with functional mobility. Baseline: 60% Goal status: Ongoing (see above)   3.  Pt will increase right hip strength to at least 4+/5 to improve ability to transfer her husband. Baseline:  Goal status: INITIAL   4.  Pt will report no increased pain when caring for her husband, including transferring her husband. Baseline:  Goal status: INITIAL     PLAN: PT FREQUENCY: 2x/week   PT DURATION: 8 weeks   PLANNED INTERVENTIONS: Therapeutic exercises, Therapeutic activity, Neuromuscular re-education, Balance training, Gait training, Patient/Family education, Self Care, Joint mobilization, Joint manipulation, Stair training, Aquatic Therapy, Dry Needling, Electrical stimulation, Cryotherapy, Moist heat, Taping, Ultrasound, Ionotophoresis 41m/ml Dexamethasone, Manual therapy, and Re-evaluation.   PLAN FOR NEXT SESSION: assess and progress HEP as indicated, strengthening, flexibility, core stability       SJuel Burrow PT 06/01/22 11:01 AM   [facility] [address] [place], [place] [address] Phone # [phone] Fax [phone]

## 2022-06-05 ENCOUNTER — Encounter: Payer: Self-pay | Admitting: Rehabilitative and Restorative Service Providers"

## 2022-06-05 ENCOUNTER — Ambulatory Visit: Payer: Medicare HMO | Admitting: Rehabilitative and Restorative Service Providers"

## 2022-06-05 DIAGNOSIS — R262 Difficulty in walking, not elsewhere classified: Secondary | ICD-10-CM | POA: Diagnosis not present

## 2022-06-05 DIAGNOSIS — M6281 Muscle weakness (generalized): Secondary | ICD-10-CM | POA: Diagnosis not present

## 2022-06-05 DIAGNOSIS — R252 Cramp and spasm: Secondary | ICD-10-CM | POA: Diagnosis not present

## 2022-06-05 DIAGNOSIS — R2689 Other abnormalities of gait and mobility: Secondary | ICD-10-CM | POA: Diagnosis not present

## 2022-06-05 NOTE — Therapy (Signed)
OUTPATIENT PHYSICAL THERAPY TREATMENT NOTE   Patient Name: Rachel Nichols MRN: [id number] DOB:May 21, 1931, 88 y.o., female Today's Date: 06/05/2022  PCP: Isaac Bliss, Rayford Halsted, MD REFERRING PROVIDER: Isaac Bliss, Rayford Halsted, MD      END OF SESSION:   PT End of Session - 06/05/22 1019     Visit Number 11    Date for PT Re-Evaluation 06/15/22    Authorization Type [employer]    Progress Note Due on Visit 20    PT Start Time 1015    PT Stop Time 1055    PT Time Calculation (min) 40 min    Activity Tolerance Patient limited by pain    Behavior During Therapy [facility] for tasks assessed/performed             Past Medical History:  Diagnosis Date   Adenomatous colon polyp 2002   Colonoscopy    Benign neoplasm of colon 2004   Colonoscopy   CAD (coronary artery disease)    echo 09/13/10- EF>55%, aortic valve is mildly sclerotic; myoview 02-20-12-no ischemia   Carotid bruit    carotid dopplers 05/16/04- normal   Diverticulosis 2002,2004,2007   Colonoscopy   Duodenitis    Fibrocystic breast disease    Gastritis    GERD (gastroesophageal reflux disease)    Heart attack ([place]) 1993   Cardiolyte stress---  stents 2012   Hiatal hernia    History of blood transfusion    pt denies   Hyperlipidemia    Peptic ulcer due to Helicobacter pylori    PUD (peptic ulcer disease)    SCC (squamous cell carcinoma) 04/24/2011   right jawline (CX35FU), left forearm (CX35FU)   Past Surgical History:  Procedure Laterality Date   [place]     From the nose   CATARACT EXTRACTION  10/26/09   Right eye   CATARACT EXTRACTION  11/09/09   Left eye   CHOLECYSTECTOMY  1984   CORONARY STENT PLACEMENT  01/10/11   hig grade ramus branch stenosis with mod LAD dz, PCI/stenting of ramus branch with a Resolute DES    DILATION AND CURETTAGE OF UTERUS     FINGER SURGERY  04/2011   Cyst removal, right hand forefinger   LEFT OOPHORECTOMY   1957   SQUAMOUS CELL CARCINOMA EXCISION Right 07/24/2018   Posterior forearm shave & ED&C   Patient Active Problem List   Diagnosis Date Noted   IGT (impaired glucose tolerance) 01/12/2021   Hyperlipidemia LDL goal <70 01/13/2015   Murmur 01/13/2015   RUQ abdominal pain 01/22/2014   Pancreatic cyst 01/22/2014   Essential hypertension 03/06/2013   CAD S/P percutaneous coronary angioplasty 04/14/2012   GASTRITIS 10/19/2009   ABDOMINAL PAIN-EPIGASTRIC 10/18/2009   CHOLECYSTECTOMY, HX OF 10/18/2009   DIVERTICULOSIS, COLON 10/14/2009   COLONIC POLYPS, ADENOMATOUS, HX OF 10/14/2009   HIATAL HERNIA WITH REFLUX 09/19/2009   WEIGHT LOSS 09/19/2009   PUD, HX OF 09/19/2009   BACK PAIN 05/27/2008    REFERRING DIAG: M54.31 (ICD-10-CM) - Right sided sciatica  THERAPY DIAG:  Muscle weakness (generalized)  Other abnormalities of gait and mobility  Cramp and spasm  Difficulty in walking, not elsewhere classified  Rationale for Evaluation and Treatment Rehabilitation  PERTINENT HISTORY: None  PRECAUTIONS: none  SUBJECTIVE: Patient reports that she felt okay after last PT session.  PAIN:  Are you having pain? Yes: NPRS scale: 7/10 Pain location: low back Pain description: aching Aggravating factors:  standing  Relieving factors: rest, meds   OBJECTIVE: (objective measures completed at initial evaluation unless otherwise dated)  OBJECTIVE:    DIAGNOSTIC FINDINGS:  N/A   PATIENT SURVEYS:  04/24/22:  Modified Oswestry 30 / 50 = 60.0 %           06/01/22:  Modified Oswestry Low Back Pain Disability Questionnaire: 28 / 50 = 56.0 %             SENSATION: Reports numbness and tingling down right leg   MUSCLE LENGTH: Right hamstring tightness noted.   POSTURE: rounded shoulders and forward head   PALPATION: Muscle spasms noted in lumbar multifidi   LUMBAR ROM: Limited at least 50% secondary to pain     LOWER EXTREMITY MMT:     MMT Right eval Left eval  Hip flexion  4- 5  Hip extension 4- 5  Hip abduction 4- 5  Hip adduction      Hip internal rotation      Hip external rotation      Knee flexion 5 5  Knee extension 5 5  Ankle dorsiflexion      Ankle plantarflexion      Ankle inversion      Ankle eversion       (Blank rows = not tested)   LUMBAR SPECIAL TESTS:  Slump test: Positive   FUNCTIONAL TESTS:  04/24/2022:  5 times sit to stand: 16.2 sec pushing up from thighs with 10/10 pain  05/15/2022:   3 min walk test: 189 ft with SPC.  Pt had to sit down at 2.5 minutes and sat for the last 30 sec of the assessment  05/25/2022: 3 min walk test: 284 ft with SPC.   06/01/22: 5 times sit to/from stand:  13.9 sec with UE pushing up from thighs and reports of 7/10 pain TUG: 12.4 sec with SPC   GAIT: Distance walked: 75 ft Assistive device utilized: None Level of assistance: Complete Independence Comments: Antalgic gait pattern noted with decreased step length       TODAY'S TREATMENT:  06/05/2022: Nustep level 4 (seat at 7, green machine), x6 min.  PT present to discuss status Seated:  LAQ and marching with 1.5#, hip adduction ball squeeze, heel/toe raises, clamshells with red tband.  BLE 2x10 each Seated HS curls with red tband 2x10 bilat Supine PPT 2x10 Supine SKTC x 5 each holding 10 sec each Lower trunk rotation 2x10 Supine hamstring stretch with strap  2x20 sec bilat Supine PPT with marching Dying Bug 2x10 Sidelying clamshells 2x10 bilat   06/01/2022: Nustep level 3 (seat at 7, green machine), x6 min.  PT present to discuss status Seated:  LAQ and marching with 1.5#, hip adduction ball squeeze, heel/toe raises, clamshells with red tband.  BLE 2x10 each Objective testing for 10th visit assessment (see above) Seated HS curls with red tband 2x10 bilat Supine PPT 2x10 Supine SKTC x 5 each holding 10 sec each Lower trunk rotation 2x10 Supine hamstring stretch with strap  2x20 sec bilat   05/29/2022: Nustep level 3 (seat at 8, green  machine), x6 min.  PT present to discuss status Seated:  LAQ and marching with 1.5#, hip adduction ball squeeze, heel/toe raises, clamshells with red tband.  BLE 2x10 each Supine PPT 2x10 Supine lower trunk rotation 2x10 Supine SKTC x 5 each holding 10 sec each    PATIENT EDUCATION:  Education details: Issued HEP Person educated: Patient Education method: Explanation, Demonstration, and Handouts Education comprehension: verbalized understanding and  returned demonstration     HOME EXERCISE PROGRAM: Access Code: [facility] URL: [email or web address] Date: 05/01/2022 Prepared by: Candyce Churn  Exercises - Seated Hamstring Stretch with Chair  - 1 x daily - 7 x weekly - 1 sets - 3 reps - 30 sec hold - Seated Sciatic Tensioner  - 1 x daily - 7 x weekly - 1 sets - 10 reps - 5 sec hold - Seated Long Arc Quad  - 1 x daily - 7 x weekly - 2 sets - 10 reps - Supine Posterior Pelvic Tilt  - 1 x daily - 7 x weekly - 2 sets - 10 reps - Supine March  - 1 x daily - 7 x weekly - 2 sets - 10 reps - Supine Lower Trunk Rotation  - 1 x daily - 7 x weekly - 2 sets - 10 reps - Supine Dead Bug with Leg Extension  - 1 x daily - 7 x weekly - 2 sets - 10 reps    ASSESSMENT:   CLINICAL IMPRESSION: Ms Yarbro presents to rehabilitation reporting continued pain, but continues to reports some improvement.  Pt able to progress with strengthening and overall, reporting that exercises decrease her pain.  Pt states that pain decreases to 5/10 following exercise session.  Pt continues to require skilled PT to progress towards goal related activities.     OBJECTIVE IMPAIRMENTS difficulty walking, decreased strength, increased muscle spasms, and pain.    ACTIVITY LIMITATIONS lifting, standing, and locomotion level   PARTICIPATION LIMITATIONS: cleaning, driving, and shopping   PERSONAL FACTORS Age and 1-2 comorbidities: GERD, Squamous Cell Carcinoma  are also affecting patient's functional outcome.     REHAB POTENTIAL: Good   CLINICAL DECISION MAKING: Evolving/moderate complexity   EVALUATION COMPLEXITY: Moderate     GOALS: Goals reviewed with patient? Yes   SHORT TERM GOALS: Target date: 05/15/2022   Pt will be independent with initial HEP. Baseline: Goal status: Goal Met 05/23/22   2.  Pt will report at least a 40% improvement in symptoms. Baseline:  Goal status: Goal Met 06/01/22     LONG TERM GOALS: Target date: 06/19/2022   Pt will be independent with advanced HEP. Baseline:  Goal status: ONGOING   2.  Pt will improve modified oswestry to 30% or less to demonstrate improvements with functional mobility. Baseline: 60% Goal status: Ongoing (see above)   3.  Pt will increase right hip strength to at least 4+/5 to improve ability to transfer her husband. Baseline:  Goal status: INITIAL   4.  Pt will report no increased pain when caring for her husband, including transferring her husband. Baseline:  Goal status: INITIAL     PLAN: PT FREQUENCY: 2x/week   PT DURATION: 8 weeks   PLANNED INTERVENTIONS: Therapeutic exercises, Therapeutic activity, Neuromuscular re-education, Balance training, Gait training, Patient/Family education, Self Care, Joint mobilization, Joint manipulation, Stair training, Aquatic Therapy, Dry Needling, Electrical stimulation, Cryotherapy, Moist heat, Taping, Ultrasound, Ionotophoresis 59m/ml Dexamethasone, Manual therapy, and Re-evaluation.   PLAN FOR NEXT SESSION: assess and progress HEP as indicated, strengthening, flexibility, core stability       SJuel Burrow PT 06/05/22 11:00 AM   [facility] [place] [address]Phone # [phone] [phone]

## 2022-06-08 ENCOUNTER — Ambulatory Visit: Payer: Medicare HMO | Attending: Internal Medicine | Admitting: Physical Therapy

## 2022-06-08 ENCOUNTER — Encounter: Payer: Self-pay | Admitting: Physical Therapy

## 2022-06-08 DIAGNOSIS — R262 Difficulty in walking, not elsewhere classified: Secondary | ICD-10-CM | POA: Diagnosis not present

## 2022-06-08 DIAGNOSIS — M6281 Muscle weakness (generalized): Secondary | ICD-10-CM | POA: Diagnosis not present

## 2022-06-08 DIAGNOSIS — R252 Cramp and spasm: Secondary | ICD-10-CM | POA: Insufficient documentation

## 2022-06-08 DIAGNOSIS — R2689 Other abnormalities of gait and mobility: Secondary | ICD-10-CM | POA: Insufficient documentation

## 2022-06-08 NOTE — Therapy (Signed)
OUTPATIENT PHYSICAL THERAPY TREATMENT NOTE   Patient Name: Rachel Nichols MRN: [id number] DOB:06-Jun-1931, 88 y.o., female Today's Date: 06/08/2022  PCP: Rachel Nichols, Rachel Halsted, MD REFERRING PROVIDER: Isaac Nichols, Rachel Halsted, MD      END OF SESSION:   PT End of Session - 06/08/22 1012     Visit Number 12    Date for PT Re-Evaluation 06/15/22    Authorization Type [employer]    Progress Note Due on Visit 20    PT Start Time 1012    PT Stop Time 1036    PT Time Calculation (min) 24 min    Activity Tolerance Patient tolerated treatment well;Patient limited by pain    Behavior During Therapy [facility] for tasks assessed/performed              Past Medical History:  Diagnosis Date   Adenomatous colon polyp 2002   Colonoscopy    Benign neoplasm of colon 2004   Colonoscopy   CAD (coronary artery disease)    echo 09/13/10- EF>55%, aortic valve is mildly sclerotic; myoview 02-20-12-no ischemia   Carotid bruit    carotid dopplers 05/16/04- normal   Diverticulosis 2002,2004,2007   Colonoscopy   Duodenitis    Fibrocystic breast disease    Gastritis    GERD (gastroesophageal reflux disease)    Heart attack ([place]) 1993   Cardiolyte stress---  stents 2012   Hiatal hernia    History of blood transfusion    pt denies   Hyperlipidemia    Peptic ulcer due to Helicobacter pylori    PUD (peptic ulcer disease)    SCC (squamous cell carcinoma) 04/24/2011   right jawline (CX35FU), left forearm (CX35FU)   Past Surgical History:  Procedure Laterality Date   [place]     From the nose   CATARACT EXTRACTION  10/26/09   Right eye   CATARACT EXTRACTION  11/09/09   Left eye   CHOLECYSTECTOMY  1984   CORONARY STENT PLACEMENT  01/10/11   hig grade ramus branch stenosis with mod LAD dz, PCI/stenting of ramus branch with a Resolute DES    DILATION AND CURETTAGE OF UTERUS     FINGER SURGERY  04/2011   Cyst removal, right hand  forefinger   LEFT OOPHORECTOMY  1957   SQUAMOUS CELL CARCINOMA EXCISION Right 07/24/2018   Posterior forearm shave & ED&C   Patient Active Problem List   Diagnosis Date Noted   IGT (impaired glucose tolerance) 01/12/2021   Hyperlipidemia LDL goal <70 01/13/2015   Murmur 01/13/2015   RUQ abdominal pain 01/22/2014   Pancreatic cyst 01/22/2014   Essential hypertension 03/06/2013   CAD S/P percutaneous coronary angioplasty 04/14/2012   GASTRITIS 10/19/2009   ABDOMINAL PAIN-EPIGASTRIC 10/18/2009   CHOLECYSTECTOMY, HX OF 10/18/2009   DIVERTICULOSIS, COLON 10/14/2009   COLONIC POLYPS, ADENOMATOUS, HX OF 10/14/2009   HIATAL HERNIA WITH REFLUX 09/19/2009   WEIGHT LOSS 09/19/2009   PUD, HX OF 09/19/2009   BACK PAIN 05/27/2008    REFERRING DIAG: M54.31 (ICD-10-CM) - Right sided sciatica  THERAPY DIAG:  Muscle weakness (generalized)  Other abnormalities of gait and mobility  Cramp and spasm  Difficulty in walking, not elsewhere classified  Rationale for Evaluation and Treatment Rehabilitation  PERTINENT HISTORY: None  PRECAUTIONS: none  SUBJECTIVE: RTLE pain started during the night and has continued throughout the morning to current. Pt with antalgic gait.   PAIN:  Are you having pain?  Yes: NPRS scale: 8/10 Pain location: back of leg Pain description: radiating Aggravating factors: standing  Relieving factors: rest, meds   OBJECTIVE: (objective measures completed at initial evaluation unless otherwise dated)  OBJECTIVE:    DIAGNOSTIC FINDINGS:  N/A   PATIENT SURVEYS:  04/24/22:  Modified Oswestry 30 / 50 = 60.0 %           06/01/22:  Modified Oswestry Low Back Pain Disability Questionnaire: 28 / 50 = 56.0 %             SENSATION: Reports numbness and tingling down right leg   MUSCLE LENGTH: Right hamstring tightness noted.   POSTURE: rounded shoulders and forward head   PALPATION: Muscle spasms noted in lumbar multifidi   LUMBAR ROM: Limited at least 50%  secondary to pain     LOWER EXTREMITY MMT:     MMT Right eval Left eval  Hip flexion 4- 5  Hip extension 4- 5  Hip abduction 4- 5  Hip adduction      Hip internal rotation      Hip external rotation      Knee flexion 5 5  Knee extension 5 5  Ankle dorsiflexion      Ankle plantarflexion      Ankle inversion      Ankle eversion       (Blank rows = not tested)   LUMBAR SPECIAL TESTS:  Slump test: Positive   FUNCTIONAL TESTS:  04/24/2022:  5 times sit to stand: 16.2 sec pushing up from thighs with 10/10 pain  05/15/2022:   3 min walk test: 189 ft with SPC.  Pt had to sit down at 2.5 minutes and sat for the last 30 sec of the assessment  05/25/2022: 3 min walk test: 284 ft with SPC.   06/01/22: 5 times sit to/from stand:  13.9 sec with UE pushing up from thighs and reports of 7/10 pain TUG: 12.4 sec with SPC   GAIT: Distance walked: 75 ft Assistive device utilized: None Level of assistance: Complete Independence Comments: Antalgic gait pattern noted with decreased step length       TODAY'S TREATMENT:   06/08/22: Got pt hooklying with bolster and put MHP under her buttocks. This position relieved pain by half. Ab seetting gently 10x with Pilates breath Manual; Bent Rt knee and provided a gentle traction by pulling tibia anteriorly. This abolished pain. 5x Advised pt to avoid bending and stooping today and to speak with he family about getting help bathing and dressing her husband as the repeated/sustained bending and stooping may be causing her pain. Pt verbally understood and agreed to speak with family.  Treatment was ended early due; pt's husband who has dementia got out of car and began wandering around parking lot. Staff got Rachel Nichols inside but pt insisted since her leg was better she prepfered to bring her husband home at this time.    06/05/2022: Nustep level 4 (seat at 7, green machine), x6 min.  PT present to discuss status Seated:  LAQ and marching with 1.5#, hip  adduction ball squeeze, heel/toe raises, clamshells with red tband.  BLE 2x10 each Seated HS curls with red tband 2x10 bilat Supine PPT 2x10 Supine SKTC x 5 each holding 10 sec each Lower trunk rotation 2x10 Supine hamstring stretch with strap  2x20 sec bilat Supine PPT with marching Dying Bug 2x10 Sidelying clamshells 2x10 bilat   06/01/2022: Nustep level 3 (seat at 7, green machine), x6 min.  PT present  to discuss status Seated:  LAQ and marching with 1.5#, hip adduction ball squeeze, heel/toe raises, clamshells with red tband.  BLE 2x10 each Objective testing for 10th visit assessment (see above) Seated HS curls with red tband 2x10 bilat Supine PPT 2x10 Supine SKTC x 5 each holding 10 sec each Lower trunk rotation 2x10 Supine hamstring stretch with strap  2x20 sec bilat     PATIENT EDUCATION:  Education details: Issued HEP Person educated: Patient Education method: Explanation, Media planner, and Handouts Education comprehension: verbalized understanding and returned demonstration     HOME EXERCISE PROGRAM: Access Code: [facility] URL: [email or web address] Date: 05/01/2022 Prepared by: Candyce Churn  Exercises - Seated Hamstring Stretch with Chair  - 1 x daily - 7 x weekly - 1 sets - 3 reps - 30 sec hold - Seated Sciatic Tensioner  - 1 x daily - 7 x weekly - 1 sets - 10 reps - 5 sec hold - Seated Long Arc Quad  - 1 x daily - 7 x weekly - 2 sets - 10 reps - Supine Posterior Pelvic Tilt  - 1 x daily - 7 x weekly - 2 sets - 10 reps - Supine March  - 1 x daily - 7 x weekly - 2 sets - 10 reps - Supine Lower Trunk Rotation  - 1 x daily - 7 x weekly - 2 sets - 10 reps - Supine Dead Bug with Leg Extension  - 1 x daily - 7 x weekly - 2 sets - 10 reps    ASSESSMENT:   CLINICAL IMPRESSION: Pt presents with increased pain in the back of her RT leg. Pt ambulated into clinic slowly and tender due to pain. PTA educated pt on not bending and stooping for rest of the  day and to speak with her family about getting help bathing and dressing her husband so she does not have to. Pain was abolished with hooklying position, heat to buttocks and some gentle short leg traction.      OBJECTIVE IMPAIRMENTS difficulty walking, decreased strength, increased muscle spasms, and pain.    ACTIVITY LIMITATIONS lifting, standing, and locomotion level   PARTICIPATION LIMITATIONS: cleaning, driving, and shopping   PERSONAL FACTORS Age and 1-2 comorbidities: GERD, Squamous Cell Carcinoma  are also affecting patient's functional outcome.    REHAB POTENTIAL: Good   CLINICAL DECISION MAKING: Evolving/moderate complexity   EVALUATION COMPLEXITY: Moderate     GOALS: Goals reviewed with patient? Yes   SHORT TERM GOALS: Target date: 05/15/2022   Pt will be independent with initial HEP. Baseline: Goal status: Goal Met 05/23/22   2.  Pt will report at least a 40% improvement in symptoms. Baseline:  Goal status: Goal Met 06/01/22     LONG TERM GOALS: Target date: 06/19/2022   Pt will be independent with advanced HEP. Baseline:  Goal status: ONGOING   2.  Pt will improve modified oswestry to 30% or less to demonstrate improvements with functional mobility. Baseline: 60% Goal status: Ongoing (see above)   3.  Pt will increase right hip strength to at least 4+/5 to improve ability to transfer her husband. Baseline:  Goal status: INITIAL   4.  Pt will report no increased pain when caring for her husband, including transferring her husband. Baseline:  Goal status: INITIAL     PLAN: PT FREQUENCY: 2x/week   PT DURATION: 8 weeks   PLANNED INTERVENTIONS: Therapeutic exercises, Therapeutic activity, Neuromuscular re-education, Balance training, Gait training, Patient/Family education, Self Care, Joint mobilization, Joint manipulation,  Stair training, Aquatic Therapy, Dry Needling, Electrical stimulation, Cryotherapy, Moist heat, Taping, Ultrasound, Ionotophoresis  45m/ml Dexamethasone, Manual therapy, and Re-evaluation.   PLAN FOR NEXT SESSION: assess and progress HEP as indicated, strengthening, flexibility, core stability      JMyrene Galas PTA 06/08/22 10:47 AM   [facility] [address] [place] [address]Phone # [phone] [phone]

## 2022-06-12 ENCOUNTER — Ambulatory Visit: Payer: Medicare HMO | Admitting: Rehabilitative and Restorative Service Providers"

## 2022-06-12 ENCOUNTER — Encounter: Payer: Self-pay | Admitting: Rehabilitative and Restorative Service Providers"

## 2022-06-12 DIAGNOSIS — M6281 Muscle weakness (generalized): Secondary | ICD-10-CM

## 2022-06-12 DIAGNOSIS — R2689 Other abnormalities of gait and mobility: Secondary | ICD-10-CM

## 2022-06-12 DIAGNOSIS — R262 Difficulty in walking, not elsewhere classified: Secondary | ICD-10-CM | POA: Diagnosis not present

## 2022-06-12 DIAGNOSIS — R252 Cramp and spasm: Secondary | ICD-10-CM | POA: Diagnosis not present

## 2022-06-12 NOTE — Therapy (Signed)
OUTPATIENT PHYSICAL THERAPY TREATMENT/REASSESSMENT NOTE   Patient Name: Rachel Nichols MRN: [id number] DOB:23-Dec-1930, 88 y.o., female Today's Date: 06/12/2022  PCP: Isaac Bliss, Rayford Halsted, MD REFERRING PROVIDER: Isaac Bliss, Rayford Halsted, MD      END OF SESSION:   PT End of Session - 06/12/22 1017     Visit Number 13    Date for PT Re-Evaluation 08/10/22    Authorization Type [employer] Medicare    Progress Note Due on Visit 20    PT Start Time 1015    PT Stop Time 1055    PT Time Calculation (min) 40 min    Activity Tolerance Patient tolerated treatment well;Patient limited by pain    Behavior During Therapy [facility] for tasks assessed/performed              Past Medical History:  Diagnosis Date   Adenomatous colon polyp 2002   Colonoscopy    Benign neoplasm of colon 2004   Colonoscopy   CAD (coronary artery disease)    echo 09/13/10- EF>55%, aortic valve is mildly sclerotic; myoview 02-20-12-no ischemia   Carotid bruit    carotid dopplers 05/16/04- normal   Diverticulosis 2002,2004,2007   Colonoscopy   Duodenitis    Fibrocystic breast disease    Gastritis    GERD (gastroesophageal reflux disease)    Heart attack ([place]) 1993   Cardiolyte stress---  stents 2012   Hiatal hernia    History of blood transfusion    pt denies   Hyperlipidemia    Peptic ulcer due to Helicobacter pylori    PUD (peptic ulcer disease)    SCC (squamous cell carcinoma) 04/24/2011   right jawline (CX35FU), left forearm (CX35FU)   Past Surgical History:  Procedure Laterality Date   [place]     From the nose   CATARACT EXTRACTION  10/26/09   Right eye   CATARACT EXTRACTION  11/09/09   Left eye   CHOLECYSTECTOMY  1984   CORONARY STENT PLACEMENT  01/10/11   hig grade ramus branch stenosis with mod LAD dz, PCI/stenting of ramus branch with a Resolute DES    DILATION AND CURETTAGE OF UTERUS     FINGER SURGERY  04/2011   Cyst removal,  right hand forefinger   LEFT OOPHORECTOMY  1957   SQUAMOUS CELL CARCINOMA EXCISION Right 07/24/2018   Posterior forearm shave & ED&C   Patient Active Problem List   Diagnosis Date Noted   IGT (impaired glucose tolerance) 01/12/2021   Hyperlipidemia LDL goal <70 01/13/2015   Murmur 01/13/2015   RUQ abdominal pain 01/22/2014   Pancreatic cyst 01/22/2014   Essential hypertension 03/06/2013   CAD S/P percutaneous coronary angioplasty 04/14/2012   GASTRITIS 10/19/2009   ABDOMINAL PAIN-EPIGASTRIC 10/18/2009   CHOLECYSTECTOMY, HX OF 10/18/2009   DIVERTICULOSIS, COLON 10/14/2009   COLONIC POLYPS, ADENOMATOUS, HX OF 10/14/2009   HIATAL HERNIA WITH REFLUX 09/19/2009   WEIGHT LOSS 09/19/2009   PUD, HX OF 09/19/2009   BACK PAIN 05/27/2008    REFERRING DIAG: M54.31 (ICD-10-CM) - Right sided sciatica  THERAPY DIAG:  Muscle weakness (generalized) - Plan: PT plan of care cert/re-cert  Other abnormalities of gait and mobility - Plan: PT plan of care cert/re-cert  Cramp and spasm - Plan: PT plan of care cert/re-cert  Difficulty in walking, not elsewhere classified - Plan: PT plan of care cert/re-cert  Rationale for Evaluation and Treatment Rehabilitation  PERTINENT HISTORY: None  PRECAUTIONS: none  SUBJECTIVE: Pt reports that she is feeling better than she did last visit.  PAIN:  Are you having pain? Yes: NPRS scale: 7/10 Pain location: back of leg Pain description: radiating Aggravating factors: standing  Relieving factors: rest, meds   OBJECTIVE: (objective measures completed at initial evaluation unless otherwise dated)  OBJECTIVE:    DIAGNOSTIC FINDINGS:  N/A   PATIENT SURVEYS:  04/24/22:  Modified Oswestry 30 / 50 = 60.0 %           06/01/22:  Modified Oswestry Low Back Pain Disability Questionnaire: 28 / 50 = 56.0 %  06/12/2022:  Modified Oswestry Low Back Pain Disability Questionnaire: 26 / 50 = 52.0 %            SENSATION: Reports numbness and tingling down right  leg   MUSCLE LENGTH: Right hamstring tightness noted.   POSTURE: rounded shoulders and forward head   PALPATION: Muscle spasms noted in lumbar multifidi   LUMBAR ROM: Limited at least 50% secondary to pain     LOWER EXTREMITY MMT:   Eval:  Right hip 4-/5 grossly throughout 06/12/2022:  R hip 4/5 grossly throughout    LUMBAR SPECIAL TESTS:  Slump test: Positive   FUNCTIONAL TESTS:  04/24/2022:  5 times sit to stand: 16.2 sec pushing up from thighs with 10/10 pain  05/15/2022:   3 min walk test: 189 ft with SPC.  Pt had to sit down at 2.5 minutes and sat for the last 30 sec of the assessment  05/25/2022: 3 min walk test: 284 ft with [facility].   06/01/22: 5 times sit to/from stand:  13.9 sec with UE pushing up from thighs and reports of 7/10 pain TUG: 12.4 sec with [facility]  06/12/2022: 3 min walk test: 438 ft with SPC   GAIT: Distance walked: 75 ft Assistive device utilized: None Level of assistance: Complete Independence Comments: Antalgic gait pattern noted with decreased step length       TODAY'S TREATMENT:   06/12/2022: Nustep level 1 (seat at 7, green machine), x7 min.  PT present to discuss status Seated:  LAQ and marching with 1.5#, hip adduction ball squeeze, heel/toe raises, hip abduction scissors.  BLE 2x10 each Seated HS curls with red tband 2x10 bilat Supine PPT 2x10 Lower trunk rotation 2x10 3 min ambulation around PT gym with [place] with good stride length noted   06/08/22: Got pt hooklying with bolster and put MHP under her buttocks. This position relieved pain by half. Ab seetting gently 10x with Pilates breath Manual; Bent Rt knee and provided a gentle traction by pulling tibia anteriorly. This abolished pain. 5x Advised pt to avoid bending and stooping today and to speak with he family about getting help bathing and dressing her husband as the repeated/sustained bending and stooping may be causing her pain. Pt verbally understood and agreed to speak with family.   Treatment was ended early due; pt's husband who has dementia got out of car and began wandering around parking lot. Staff got Mr Ahn inside but pt insisted since her leg was better she prepfered to bring her husband home at this time.    06/05/2022: Nustep level 4 (seat at 7, green machine), x6 min.  PT present to discuss status Seated:  LAQ and marching with 1.5#, hip adduction ball squeeze, heel/toe raises, clamshells with red tband.  BLE 2x10 each Seated HS curls with red tband 2x10 bilat Supine PPT 2x10 Supine SKTC x 5 each holding 10 sec each Lower trunk rotation  2x10 Supine hamstring stretch with strap  2x20 sec bilat Supine PPT with marching Dying Bug 2x10 Sidelying clamshells 2x10 bilat     PATIENT EDUCATION:  Education details: Issued HEP Person educated: Patient Education method: Explanation, Demonstration, and Handouts Education comprehension: verbalized understanding and returned demonstration     HOME EXERCISE PROGRAM: Access Code: [facility] URL: [email or web address] Date: 05/01/2022 Prepared by: Candyce Churn  Exercises - Seated Hamstring Stretch with Chair  - 1 x daily - 7 x weekly - 1 sets - 3 reps - 30 sec hold - Seated Sciatic Tensioner  - 1 x daily - 7 x weekly - 1 sets - 10 reps - 5 sec hold - Seated Long Arc Quad  - 1 x daily - 7 x weekly - 2 sets - 10 reps - Supine Posterior Pelvic Tilt  - 1 x daily - 7 x weekly - 2 sets - 10 reps - Supine March  - 1 x daily - 7 x weekly - 2 sets - 10 reps - Supine Lower Trunk Rotation  - 1 x daily - 7 x weekly - 2 sets - 10 reps - Supine Dead Bug with Leg Extension  - 1 x daily - 7 x weekly - 2 sets - 10 reps    ASSESSMENT:   CLINICAL IMPRESSION: Ms Feher presents to skilled PT with reports of feeling better than she did last session.  Pt overall continues to make progress towards goals and has improved distance noted with 3 min ambulation test and with MMT.  At completion of session, pt reports  feeling better with pain decreased down to 2/10.  Pt with improved score on Modified [place], but does state that she still has difficulty caring for her husband, especially if he falls.  Pt continues to require skilled PT for an additional 1-2x/week for 8 additional weeks.     OBJECTIVE IMPAIRMENTS difficulty walking, decreased strength, increased muscle spasms, and pain.    ACTIVITY LIMITATIONS lifting, standing, and locomotion level   PARTICIPATION LIMITATIONS: cleaning, driving, and shopping   PERSONAL FACTORS Age and 1-2 comorbidities: GERD, Squamous Cell Carcinoma  are also affecting patient's functional outcome.    REHAB POTENTIAL: Good   CLINICAL DECISION MAKING: Evolving/moderate complexity   EVALUATION COMPLEXITY: Moderate     GOALS: Goals reviewed with patient? Yes   SHORT TERM GOALS: Target date: 05/15/2022   Pt will be independent with initial HEP. Baseline: Goal status: Goal Met 05/23/22   2.  Pt will report at least a 40% improvement in symptoms. Baseline:  Goal status: Goal Met 06/01/22     LONG TERM GOALS: Target date: 08/09/2022   Pt will be independent with advanced HEP. Baseline:  Goal status: ONGOING   2.  Pt will improve modified oswestry to 30% or less to demonstrate improvements with functional mobility. Baseline: 60% Goal status: Ongoing (see above)   3.  Pt will increase right hip strength to at least 4+/5 to improve ability to transfer her husband. Baseline:  Goal status: Ongoing (see above)   4.  Pt will report no increased pain when caring for her husband, including transferring her husband. Baseline:  Goal status: Ongoing      PLAN: PT FREQUENCY: 1-2x/week   PT DURATION: 8 weeks   PLANNED INTERVENTIONS: Therapeutic exercises, Therapeutic activity, Neuromuscular re-education, Balance training, Gait training, Patient/Family education, Self Care, Joint mobilization, Joint manipulation, Stair training, Aquatic Therapy, Dry Needling,  Electrical stimulation, Cryotherapy, Moist heat, Taping, Ultrasound, Ionotophoresis 76m/ml Dexamethasone, Manual therapy, and  Re-evaluation.   PLAN FOR NEXT SESSION: assess and progress HEP as indicated, strengthening, flexibility, core stability      Juel Burrow, PT 06/12/22 11:06 AM  [facility] [address], [place], [place] [address] Phone # [phone] Fax [phone]

## 2022-06-15 ENCOUNTER — Encounter: Payer: Self-pay | Admitting: Rehabilitative and Restorative Service Providers"

## 2022-06-15 ENCOUNTER — Ambulatory Visit: Payer: Medicare HMO | Admitting: Rehabilitative and Restorative Service Providers"

## 2022-06-15 DIAGNOSIS — M6281 Muscle weakness (generalized): Secondary | ICD-10-CM

## 2022-06-15 DIAGNOSIS — R262 Difficulty in walking, not elsewhere classified: Secondary | ICD-10-CM

## 2022-06-15 DIAGNOSIS — R2689 Other abnormalities of gait and mobility: Secondary | ICD-10-CM | POA: Diagnosis not present

## 2022-06-15 DIAGNOSIS — R252 Cramp and spasm: Secondary | ICD-10-CM

## 2022-06-15 NOTE — Therapy (Signed)
OUTPATIENT PHYSICAL THERAPY TREATMENT NOTE   Patient Name: Rachel Nichols MRN: [id number] DOB:1931-01-18, 88 y.o., female Today's Date: 06/15/2022  PCP: Isaac Bliss, Rayford Halsted, MD REFERRING PROVIDER: Isaac Bliss, Rayford Halsted, MD      END OF SESSION:   PT End of Session - 06/15/22 1014     Visit Number 14    Date for PT Re-Evaluation 08/10/22    Authorization Type [employer]    Progress Note Due on Visit 24    PT Start Time 1006    PT Stop Time 1045    PT Time Calculation (min) 39 min    Activity Tolerance Patient tolerated treatment well;Patient limited by pain    Behavior During Therapy [facility] for tasks assessed/performed              Past Medical History:  Diagnosis Date   Adenomatous colon polyp 2002   Colonoscopy    Benign neoplasm of colon 2004   Colonoscopy   CAD (coronary artery disease)    echo 09/13/10- EF>55%, aortic valve is mildly sclerotic; myoview 02-20-12-no ischemia   Carotid bruit    carotid dopplers 05/16/04- normal   Diverticulosis 2002,2004,2007   Colonoscopy   Duodenitis    Fibrocystic breast disease    Gastritis    GERD (gastroesophageal reflux disease)    Heart attack ([place]) 1993   Cardiolyte stress---  stents 2012   Hiatal hernia    History of blood transfusion    pt denies   Hyperlipidemia    Peptic ulcer due to Helicobacter pylori    PUD (peptic ulcer disease)    SCC (squamous cell carcinoma) 04/24/2011   right jawline (CX35FU), left forearm (CX35FU)   Past Surgical History:  Procedure Laterality Date   [place]     From the nose   CATARACT EXTRACTION  10/26/09   Right eye   CATARACT EXTRACTION  11/09/09   Left eye   CHOLECYSTECTOMY  1984   CORONARY STENT PLACEMENT  01/10/11   hig grade ramus branch stenosis with mod LAD dz, PCI/stenting of ramus branch with a Resolute DES    DILATION AND CURETTAGE OF UTERUS     FINGER SURGERY  04/2011   Cyst removal, right hand  forefinger   LEFT OOPHORECTOMY  1957   SQUAMOUS CELL CARCINOMA EXCISION Right 07/24/2018   Posterior forearm shave & ED&C   Patient Active Problem List   Diagnosis Date Noted   IGT (impaired glucose tolerance) 01/12/2021   Hyperlipidemia LDL goal <70 01/13/2015   Murmur 01/13/2015   RUQ abdominal pain 01/22/2014   Pancreatic cyst 01/22/2014   Essential hypertension 03/06/2013   CAD S/P percutaneous coronary angioplasty 04/14/2012   GASTRITIS 10/19/2009   ABDOMINAL PAIN-EPIGASTRIC 10/18/2009   CHOLECYSTECTOMY, HX OF 10/18/2009   DIVERTICULOSIS, COLON 10/14/2009   COLONIC POLYPS, ADENOMATOUS, HX OF 10/14/2009   HIATAL HERNIA WITH REFLUX 09/19/2009   WEIGHT LOSS 09/19/2009   PUD, HX OF 09/19/2009   BACK PAIN 05/27/2008    REFERRING DIAG: M54.31 (ICD-10-CM) - Right sided sciatica  THERAPY DIAG:  Muscle weakness (generalized)  Other abnormalities of gait and mobility  Cramp and spasm  Difficulty in walking, not elsewhere classified  Rationale for Evaluation and Treatment Rehabilitation  PERTINENT HISTORY: None  PRECAUTIONS: none  SUBJECTIVE: Pt reports that she is having some pain this morning, but still rates pain at 7/10.  PAIN:  Are you having pain? Yes: NPRS scale:  7/10 Pain location: back of leg Pain description: radiating Aggravating factors: standing  Relieving factors: rest, meds   OBJECTIVE: (objective measures completed at initial evaluation unless otherwise dated)  OBJECTIVE:    DIAGNOSTIC FINDINGS:  N/A   PATIENT SURVEYS:  04/24/22:  Modified Oswestry 30 / 50 = 60.0 %           06/01/22:  Modified Oswestry Low Back Pain Disability Questionnaire: 28 / 50 = 56.0 %  06/12/2022:  Modified Oswestry Low Back Pain Disability Questionnaire: 26 / 50 = 52.0 %            SENSATION: Reports numbness and tingling down right leg   MUSCLE LENGTH: Right hamstring tightness noted.   POSTURE: rounded shoulders and forward head   PALPATION: Muscle spasms noted  in lumbar multifidi   LUMBAR ROM: Limited at least 50% secondary to pain     LOWER EXTREMITY MMT:   Eval:  Right hip 4-/5 grossly throughout 06/12/2022:  R hip 4/5 grossly throughout    LUMBAR SPECIAL TESTS:  Slump test: Positive   FUNCTIONAL TESTS:  04/24/2022:  5 times sit to stand: 16.2 sec pushing up from thighs with 10/10 pain  05/15/2022:   3 min walk test: 189 ft with SPC.  Pt had to sit down at 2.5 minutes and sat for the last 30 sec of the assessment  05/25/2022: 3 min walk test: 284 ft with [facility].   06/01/22: 5 times sit to/from stand:  13.9 sec with UE pushing up from thighs and reports of 7/10 pain TUG: 12.4 sec with [facility]  06/12/2022: 3 min walk test: 438 ft with SPC   GAIT: Distance walked: 75 ft Assistive device utilized: None Level of assistance: Complete Independence Comments: Antalgic gait pattern noted with decreased step length       TODAY'S TREATMENT:   06/15/2022: Nustep level 1 (seat at 7, green machine), x7 min.  PT present to discuss status Seated:  LAQ and marching with 1.5#, hip adduction ball squeeze, heel/toe raises, hip abduction scissors.  BLE 2x10 each Seated HS curls with red tband 2x10 bilat Supine PPT 2x10 Supine PPT with marching 2x10 Lower trunk rotation 2x10 Supine SLR 2x10 bilat Sidelying clamshells 2x10 reps bilat Supine hamstring stretch with strap 2x20 sec bilat  06/12/2022: Nustep level 1 (seat at 7, green machine), x7 min.  PT present to discuss status Seated:  LAQ and marching with 1.5#, hip adduction ball squeeze, heel/toe raises, hip abduction scissors.  BLE 2x10 each Seated HS curls with red tband 2x10 bilat Supine PPT 2x10 Lower trunk rotation 2x10 3 min ambulation around PT gym with [place] with good stride length noted   06/08/22: Got pt hooklying with bolster and put MHP under her buttocks. This position relieved pain by half. Ab seetting gently 10x with Pilates breath Manual; Bent Rt knee and provided a gentle traction by  pulling tibia anteriorly. This abolished pain. 5x Advised pt to avoid bending and stooping today and to speak with he family about getting help bathing and dressing her husband as the repeated/sustained bending and stooping may be causing her pain. Pt verbally understood and agreed to speak with family.  Treatment was ended early due; pt's husband who has dementia got out of car and began wandering around parking lot. Staff got Mr Barnhart inside but pt insisted since her leg was better she prepfered to bring her husband home at this time.      PATIENT EDUCATION:  Education details: Issued HEP  Person educated: Patient Education method: Explanation, Demonstration, and Handouts Education comprehension: verbalized understanding and returned demonstration     HOME EXERCISE PROGRAM: Access Code: [facility] URL: [email or web address] Date: 05/01/2022 Prepared by: Candyce Churn  Exercises - Seated Hamstring Stretch with Chair  - 1 x daily - 7 x weekly - 1 sets - 3 reps - 30 sec hold - Seated Sciatic Tensioner  - 1 x daily - 7 x weekly - 1 sets - 10 reps - 5 sec hold - Seated Long Arc Quad  - 1 x daily - 7 x weekly - 2 sets - 10 reps - Supine Posterior Pelvic Tilt  - 1 x daily - 7 x weekly - 2 sets - 10 reps - Supine March  - 1 x daily - 7 x weekly - 2 sets - 10 reps - Supine Lower Trunk Rotation  - 1 x daily - 7 x weekly - 2 sets - 10 reps - Supine Dead Bug with Leg Extension  - 1 x daily - 7 x weekly - 2 sets - 10 reps    ASSESSMENT:   CLINICAL IMPRESSION: Ms Dumler presents to skilled PT with continued reports of feeling better and getting stronger. Pt able to progress with strengthening exercises today, but continues with difficulty with right hamstring stretching.  Pt continues to require skilled PT to progress towards goal related activities.     OBJECTIVE IMPAIRMENTS difficulty walking, decreased strength, increased muscle spasms, and pain.    ACTIVITY LIMITATIONS  lifting, standing, and locomotion level   PARTICIPATION LIMITATIONS: cleaning, driving, and shopping   PERSONAL FACTORS Age and 1-2 comorbidities: GERD, Squamous Cell Carcinoma  are also affecting patient's functional outcome.    REHAB POTENTIAL: Good   CLINICAL DECISION MAKING: Evolving/moderate complexity   EVALUATION COMPLEXITY: Moderate     GOALS: Goals reviewed with patient? Yes   SHORT TERM GOALS: Target date: 05/15/2022   Pt will be independent with initial HEP. Baseline: Goal status: Goal Met 05/23/22   2.  Pt will report at least a 40% improvement in symptoms. Baseline:  Goal status: Goal Met 06/01/22     LONG TERM GOALS: Target date: 08/09/2022   Pt will be independent with advanced HEP. Baseline:  Goal status: ONGOING   2.  Pt will improve modified oswestry to 30% or less to demonstrate improvements with functional mobility. Baseline: 60% Goal status: Ongoing (see above)   3.  Pt will increase right hip strength to at least 4+/5 to improve ability to transfer her husband. Baseline:  Goal status: Ongoing (see above)   4.  Pt will report no increased pain when caring for her husband, including transferring her husband. Baseline:  Goal status: Ongoing      PLAN: PT FREQUENCY: 1-2x/week   PT DURATION: 8 weeks   PLANNED INTERVENTIONS: Therapeutic exercises, Therapeutic activity, Neuromuscular re-education, Balance training, Gait training, Patient/Family education, Self Care, Joint mobilization, Joint manipulation, Stair training, Aquatic Therapy, Dry Needling, Electrical stimulation, Cryotherapy, Moist heat, Taping, Ultrasound, Ionotophoresis 45m/ml Dexamethasone, Manual therapy, and Re-evaluation.   PLAN FOR NEXT SESSION: assess and progress HEP as indicated, strengthening, flexibility, core stability      SJuel Burrow PT 06/15/22 10:55 AM  [facility] [address] [place]  [id number] #  [phone] [phone]

## 2022-06-18 ENCOUNTER — Encounter: Payer: Self-pay | Admitting: Rehabilitative and Restorative Service Providers"

## 2022-06-18 ENCOUNTER — Ambulatory Visit: Payer: Medicare HMO | Admitting: Rehabilitative and Restorative Service Providers"

## 2022-06-18 DIAGNOSIS — R2689 Other abnormalities of gait and mobility: Secondary | ICD-10-CM

## 2022-06-18 DIAGNOSIS — R262 Difficulty in walking, not elsewhere classified: Secondary | ICD-10-CM | POA: Diagnosis not present

## 2022-06-18 DIAGNOSIS — R252 Cramp and spasm: Secondary | ICD-10-CM

## 2022-06-18 DIAGNOSIS — M6281 Muscle weakness (generalized): Secondary | ICD-10-CM | POA: Diagnosis not present

## 2022-06-18 NOTE — Therapy (Signed)
OUTPATIENT PHYSICAL THERAPY TREATMENT NOTE   Patient Name: Rachel Nichols MRN: [id number] DOB:[birth year], 88 y.o., female Today's Date: 06/18/2022  PCP: Isaac Bliss, Rayford Halsted, MD REFERRING PROVIDER: Isaac Bliss, Rayford Halsted, MD      END OF SESSION:   PT End of Session - 06/18/22 1237     Visit Number 15    Date for PT Re-Evaluation 08/10/22    Authorization Type [employer] Medicare    Progress Note Due on Visit 20    PT Start Time 1228    PT Stop Time 1306    PT Time Calculation (min) 38 min    Activity Tolerance Patient tolerated treatment well;Patient limited by pain    Behavior During Therapy [facility] for tasks assessed/performed              Past Medical History:  Diagnosis Date   Adenomatous colon polyp 2002   Colonoscopy    Benign neoplasm of colon 2004   Colonoscopy   CAD (coronary artery disease)    echo 09/13/10- EF>55%, aortic valve is mildly sclerotic; myoview 02-20-12-no ischemia   Carotid bruit    carotid dopplers 05/16/04- normal   Diverticulosis 2002,2004,2007   Colonoscopy   Duodenitis    Fibrocystic breast disease    Gastritis    GERD (gastroesophageal reflux disease)    Heart attack ([place]) 1993   Cardiolyte stress---  stents 2012   Hiatal hernia    History of blood transfusion    pt denies   Hyperlipidemia    Peptic ulcer due to Helicobacter pylori    PUD (peptic ulcer disease)    SCC (squamous cell carcinoma) 04/24/2011   right jawline (CX35FU), left forearm (CX35FU)   Past Surgical History:  Procedure Laterality Date   [place]     From the nose   CATARACT EXTRACTION  10/26/09   Right eye   CATARACT EXTRACTION  11/09/09   Left eye   CHOLECYSTECTOMY  1984   CORONARY STENT PLACEMENT  01/10/11   hig grade ramus branch stenosis with mod LAD dz, PCI/stenting of ramus branch with a Resolute DES    DILATION AND CURETTAGE OF UTERUS     FINGER SURGERY  04/2011   Cyst removal, right hand  forefinger   LEFT OOPHORECTOMY  1957   SQUAMOUS CELL CARCINOMA EXCISION Right 07/24/2018   Posterior forearm shave & ED&C   Patient Active Problem List   Diagnosis Date Noted   IGT (impaired glucose tolerance) 01/12/2021   Hyperlipidemia LDL goal <70 01/13/2015   Murmur 01/13/2015   RUQ abdominal pain 01/22/2014   Pancreatic cyst 01/22/2014   Essential hypertension 03/06/2013   CAD S/P percutaneous coronary angioplasty 04/14/2012   GASTRITIS 10/19/2009   ABDOMINAL PAIN-EPIGASTRIC 10/18/2009   CHOLECYSTECTOMY, HX OF 10/18/2009   DIVERTICULOSIS, COLON 10/14/2009   COLONIC POLYPS, ADENOMATOUS, HX OF 10/14/2009   HIATAL HERNIA WITH REFLUX 09/19/2009   WEIGHT LOSS 09/19/2009   PUD, HX OF 09/19/2009   BACK PAIN 05/27/2008    REFERRING DIAG: M54.31 (ICD-10-CM) - Right sided sciatica  THERAPY DIAG:  Muscle weakness (generalized)  Other abnormalities of gait and mobility  Cramp and spasm  Difficulty in walking, not elsewhere classified  Rationale for Evaluation and Treatment Rehabilitation  PERTINENT HISTORY: None  PRECAUTIONS: none  SUBJECTIVE: Pt reports continued pain, but she is trying to do her HEP.  PAIN:  Are you having pain? Yes: NPRS scale: 6/10 Pain location: back  of leg Pain description: radiating Aggravating factors: standing  Relieving factors: rest, meds   OBJECTIVE: (objective measures completed at initial evaluation unless otherwise dated)  OBJECTIVE:    DIAGNOSTIC FINDINGS:  N/A   PATIENT SURVEYS:  04/24/22:  Modified Oswestry 30 / 50 = 60.0 %           06/01/22:  Modified Oswestry Low Back Pain Disability Questionnaire: 28 / 50 = 56.0 %  06/12/2022:  Modified Oswestry Low Back Pain Disability Questionnaire: 26 / 50 = 52.0 %            SENSATION: Reports numbness and tingling down right leg   MUSCLE LENGTH: Right hamstring tightness noted.   POSTURE: rounded shoulders and forward head   PALPATION: Muscle spasms noted in lumbar multifidi    LUMBAR ROM: Limited at least 50% secondary to pain     LOWER EXTREMITY MMT:   Eval:  Right hip 4-/5 grossly throughout 06/12/2022:  R hip 4/5 grossly throughout    LUMBAR SPECIAL TESTS:  Slump test: Positive   FUNCTIONAL TESTS:  04/24/2022:  5 times sit to stand: 16.2 sec pushing up from thighs with 10/10 pain  05/15/2022:   3 min walk test: 189 ft with SPC.  Pt had to sit down at 2.5 minutes and sat for the last 30 sec of the assessment  05/25/2022: 3 min walk test: 284 ft with [facility].   06/01/22: 5 times sit to/from stand:  13.9 sec with UE pushing up from thighs and reports of 7/10 pain TUG: 12.4 sec with [facility]  06/12/2022: 3 min walk test: 438 ft with SPC   GAIT: Distance walked: 75 ft Assistive device utilized: None Level of assistance: Complete Independence Comments: Antalgic gait pattern noted with decreased step length       TODAY'S TREATMENT: 06/18/2022: Nustep level 5 (seat at 7, green machine), x7 min.  PT present to discuss status Seated:  LAQ and marching with 2#, hip adduction ball squeeze, heel/toe raises, hip abduction scissors.  BLE 2x10 each Seated holding yellow plyoball:  hip to hip, hip to shoulder.  2x10 each Seated HS curls with red tband 2x10 bilat Sit to/from stand 2x5 with minimal UE use Seated on dynadisc:  4 way pelvic tilts 2x10 each Seated lumbar extension 2x10 Ambulation down PT hallway and back with SPC without loss of balance or reports of increased pain.    06/15/2022: Nustep level 1 (seat at 7, green machine), x7 min.  PT present to discuss status Seated:  LAQ and marching with 1.5#, hip adduction ball squeeze, heel/toe raises, hip abduction scissors.  BLE 2x10 each Seated HS curls with red tband 2x10 bilat Supine PPT 2x10 Supine PPT with marching 2x10 Lower trunk rotation 2x10 Supine SLR 2x10 bilat Sidelying clamshells 2x10 reps bilat Supine hamstring stretch with strap 2x20 sec bilat  06/12/2022: Nustep level 1 (seat at 7, green machine),  x7 min.  PT present to discuss status Seated:  LAQ and marching with 1.5#, hip adduction ball squeeze, heel/toe raises, hip abduction scissors.  BLE 2x10 each Seated HS curls with red tband 2x10 bilat Supine PPT 2x10 Lower trunk rotation 2x10 3 min ambulation around PT gym with SPC with good stride length noted     PATIENT EDUCATION:  Education details: Issued HEP Person educated: Patient Education method: Explanation, Media planner, and Handouts Education comprehension: verbalized understanding and returned demonstration     HOME EXERCISE PROGRAM: Access Code: [facility] URL: [email or web address] Date: 05/01/2022 Prepared by: Candyce Churn  Exercises -  Seated Hamstring Stretch with Chair  - 1 x daily - 7 x weekly - 1 sets - 3 reps - 30 sec hold - Seated Sciatic Tensioner  - 1 x daily - 7 x weekly - 1 sets - 10 reps - 5 sec hold - Seated Long Arc Quad  - 1 x daily - 7 x weekly - 2 sets - 10 reps - Supine Posterior Pelvic Tilt  - 1 x daily - 7 x weekly - 2 sets - 10 reps - Supine March  - 1 x daily - 7 x weekly - 2 sets - 10 reps - Supine Lower Trunk Rotation  - 1 x daily - 7 x weekly - 2 sets - 10 reps - Supine Dead Bug with Leg Extension  - 1 x daily - 7 x weekly - 2 sets - 10 reps    ASSESSMENT:   CLINICAL IMPRESSION: Ms Voris presents to skilled PT reporting improved pain with using NuStep.  Pt states that she has been doing her HEP routinely.  Pt tolerated session well and reported decreased pain throughout session with exercises.  Pt continues to progress towards goal related activities and continues to require continued skilled PT.     OBJECTIVE IMPAIRMENTS difficulty walking, decreased strength, increased muscle spasms, and pain.    ACTIVITY LIMITATIONS lifting, standing, and locomotion level   PARTICIPATION LIMITATIONS: cleaning, driving, and shopping   PERSONAL FACTORS Age and 1-2 comorbidities: GERD, Squamous Cell Carcinoma  are also affecting  patient's functional outcome.    REHAB POTENTIAL: Good   CLINICAL DECISION MAKING: Evolving/moderate complexity   EVALUATION COMPLEXITY: Moderate     GOALS: Goals reviewed with patient? Yes   SHORT TERM GOALS: Target date: 05/15/2022   Pt will be independent with initial HEP. Baseline: Goal status: Goal Met 05/23/22   2.  Pt will report at least a 40% improvement in symptoms. Baseline:  Goal status: Goal Met 06/01/22     LONG TERM GOALS: Target date: 08/09/2022   Pt will be independent with advanced HEP. Baseline:  Goal status: ONGOING   2.  Pt will improve modified oswestry to 30% or less to demonstrate improvements with functional mobility. Baseline: 60% Goal status: Ongoing (see above)   3.  Pt will increase right hip strength to at least 4+/5 to improve ability to transfer her husband. Baseline:  Goal status: Ongoing (see above)   4.  Pt will report no increased pain when caring for her husband, including transferring her husband. Baseline:  Goal status: Ongoing      PLAN: PT FREQUENCY: 1-2x/week   PT DURATION: 8 weeks   PLANNED INTERVENTIONS: Therapeutic exercises, Therapeutic activity, Neuromuscular re-education, Balance training, Gait training, Patient/Family education, Self Care, Joint mobilization, Joint manipulation, Stair training, Aquatic Therapy, Dry Needling, Electrical stimulation, Cryotherapy, Moist heat, Taping, Ultrasound, Ionotophoresis 34m/ml Dexamethasone, Manual therapy, and Re-evaluation.   PLAN FOR NEXT SESSION: assess and progress HEP as indicated, strengthening, flexibility, core stability      SJuel Burrow PT 06/18/22 1:18 PM  [facility] [address] [place] [address]Phone # [phone] [phone]

## 2022-06-21 ENCOUNTER — Ambulatory Visit: Payer: Medicare HMO | Admitting: Rehabilitative and Restorative Service Providers"

## 2022-06-21 ENCOUNTER — Encounter: Payer: Self-pay | Admitting: Rehabilitative and Restorative Service Providers"

## 2022-06-21 DIAGNOSIS — R262 Difficulty in walking, not elsewhere classified: Secondary | ICD-10-CM | POA: Diagnosis not present

## 2022-06-21 DIAGNOSIS — R2689 Other abnormalities of gait and mobility: Secondary | ICD-10-CM

## 2022-06-21 DIAGNOSIS — R252 Cramp and spasm: Secondary | ICD-10-CM | POA: Diagnosis not present

## 2022-06-21 DIAGNOSIS — M6281 Muscle weakness (generalized): Secondary | ICD-10-CM

## 2022-06-21 NOTE — Therapy (Signed)
OUTPATIENT PHYSICAL THERAPY TREATMENT NOTE   Patient Name: Rachel Nichols MRN: [id number] DOB:03-Apr-1931, 88 y.o., female Today's Date: 06/21/2022  PCP: Isaac Bliss, Rayford Halsted, MD REFERRING PROVIDER: Isaac Bliss, Rayford Halsted, MD      END OF SESSION:   PT End of Session - 06/21/22 1233     Visit Number 16    Date for PT Re-Evaluation 08/10/22    Authorization Type [employer]    Progress Note Due on Visit 47    PT Start Time 1227    PT Stop Time 1305    PT Time Calculation (min) 38 min    Activity Tolerance Patient tolerated treatment well;Patient limited by pain    Behavior During Therapy [facility] for tasks assessed/performed              Past Medical History:  Diagnosis Date   Adenomatous colon polyp 2002   Colonoscopy    Benign neoplasm of colon 2004   Colonoscopy   CAD (coronary artery disease)    echo 09/13/10- EF>55%, aortic valve is mildly sclerotic; myoview 02-20-12-no ischemia   Carotid bruit    carotid dopplers 05/16/04- normal   Diverticulosis 2002,2004,2007   Colonoscopy   Duodenitis    Fibrocystic breast disease    Gastritis    GERD (gastroesophageal reflux disease)    Heart attack ([place]) 1993   Cardiolyte stress---  stents 2012   Hiatal hernia    History of blood transfusion    pt denies   Hyperlipidemia    Peptic ulcer due to Helicobacter pylori    PUD (peptic ulcer disease)    SCC (squamous cell carcinoma) 04/24/2011   right jawline (CX35FU), left forearm (CX35FU)   Past Surgical History:  Procedure Laterality Date   [place]     From the nose   CATARACT EXTRACTION  10/26/09   Right eye   CATARACT EXTRACTION  11/09/09   Left eye   CHOLECYSTECTOMY  1984   CORONARY STENT PLACEMENT  01/10/11   hig grade ramus branch stenosis with mod LAD dz, PCI/stenting of ramus branch with a Resolute DES    DILATION AND CURETTAGE OF UTERUS     FINGER SURGERY  04/2011   Cyst removal, right hand  forefinger   LEFT OOPHORECTOMY  1957   SQUAMOUS CELL CARCINOMA EXCISION Right 07/24/2018   Posterior forearm shave & ED&C   Patient Active Problem List   Diagnosis Date Noted   IGT (impaired glucose tolerance) 01/12/2021   Hyperlipidemia LDL goal <70 01/13/2015   Murmur 01/13/2015   RUQ abdominal pain 01/22/2014   Pancreatic cyst 01/22/2014   Essential hypertension 03/06/2013   CAD S/P percutaneous coronary angioplasty 04/14/2012   GASTRITIS 10/19/2009   ABDOMINAL PAIN-EPIGASTRIC 10/18/2009   CHOLECYSTECTOMY, HX OF 10/18/2009   DIVERTICULOSIS, COLON 10/14/2009   COLONIC POLYPS, ADENOMATOUS, HX OF 10/14/2009   HIATAL HERNIA WITH REFLUX 09/19/2009   WEIGHT LOSS 09/19/2009   PUD, HX OF 09/19/2009   BACK PAIN 05/27/2008    REFERRING DIAG: M54.31 (ICD-10-CM) - Right sided sciatica  THERAPY DIAG:  Muscle weakness (generalized)  Other abnormalities of gait and mobility  Cramp and spasm  Difficulty in walking, not elsewhere classified  Rationale for Evaluation and Treatment Rehabilitation  PERTINENT HISTORY: None  PRECAUTIONS: none  SUBJECTIVE: Pt reports continued pain, but she is trying to do her HEP.  PAIN:  Are you having pain? Yes: NPRS scale: 6/10 Pain location: back  of leg Pain description: radiating Aggravating factors: standing  Relieving factors: rest, meds   OBJECTIVE: (objective measures completed at initial evaluation unless otherwise dated)  OBJECTIVE:    DIAGNOSTIC FINDINGS:  N/A   PATIENT SURVEYS:  04/24/22:  Modified Oswestry 30 / 50 = 60.0 %           06/01/22:  Modified Oswestry Low Back Pain Disability Questionnaire: 28 / 50 = 56.0 %  06/12/2022:  Modified Oswestry Low Back Pain Disability Questionnaire: 26 / 50 = 52.0 %            SENSATION: Reports numbness and tingling down right leg   MUSCLE LENGTH: Right hamstring tightness noted.   POSTURE: rounded shoulders and forward head   PALPATION: Muscle spasms noted in lumbar multifidi    LUMBAR ROM: Limited at least 50% secondary to pain     LOWER EXTREMITY MMT:   Eval:  Right hip 4-/5 grossly throughout 06/12/2022:  R hip 4/5 grossly throughout    LUMBAR SPECIAL TESTS:  Slump test: Positive   FUNCTIONAL TESTS:  04/24/2022:  5 times sit to stand: 16.2 sec pushing up from thighs with 10/10 pain  05/15/2022:   3 min walk test: 189 ft with SPC.  Pt had to sit down at 2.5 minutes and sat for the last 30 sec of the assessment  05/25/2022: 3 min walk test: 284 ft with [facility].   06/01/22: 5 times sit to/from stand:  13.9 sec with UE pushing up from thighs and reports of 7/10 pain TUG: 12.4 sec with [facility]  06/12/2022: 3 min walk test: 438 ft with SPC   GAIT: Distance walked: 75 ft Assistive device utilized: None Level of assistance: Complete Independence Comments: Antalgic gait pattern noted with decreased step length       TODAY'S TREATMENT: 06/21/2022: Nustep level 3 (seat at 7, blue machine), x8 min.  PT present to discuss status Seated:  LAQ and marching with 2#, hip adduction ball squeeze, heel/toe raises, hip abduction scissors.  BLE 2x10 each Seated HS curls with red tband 2x10 bilat Seated on dynadisc:  4 way pelvic tilts 2x10 each Lower trunk rotation 2x10 Supine PPT with marching 2x10 Supine sciatic nerve floss with strap x5 bilat (5 "flosses" each time with PT also supporting LE) Sidelying clamshells 2x10 reps bilat   06/18/2022: Nustep level 5 (seat at 7, green machine), x7 min.  PT present to discuss status Seated:  LAQ and marching with 2#, hip adduction ball squeeze, heel/toe raises, hip abduction scissors.  BLE 2x10 each Seated holding yellow plyoball:  hip to hip, hip to shoulder.  2x10 each Seated HS curls with red tband 2x10 bilat Sit to/from stand 2x5 with minimal UE use Seated on dynadisc:  4 way pelvic tilts 2x10 each Seated lumbar extension 2x10 Ambulation down PT hallway and back with SPC without loss of balance or reports of increased  pain.    06/15/2022: Nustep level 1 (seat at 7, green machine), x7 min.  PT present to discuss status Seated:  LAQ and marching with 1.5#, hip adduction ball squeeze, heel/toe raises, hip abduction scissors.  BLE 2x10 each Seated HS curls with red tband 2x10 bilat Supine PPT 2x10 Supine PPT with marching 2x10 Lower trunk rotation 2x10 Supine SLR 2x10 bilat Sidelying clamshells 2x10 reps bilat Supine hamstring stretch with strap 2x20 sec bilat     PATIENT EDUCATION:  Education details: Issued HEP Person educated: Patient Education method: Explanation, Demonstration, and Handouts Education comprehension: verbalized understanding and  returned demonstration     HOME EXERCISE PROGRAM: Access Code: [facility] URL: [email or web address] Date: 05/01/2022 Prepared by: Candyce Churn  Exercises - Seated Hamstring Stretch with Chair  - 1 x daily - 7 x weekly - 1 sets - 3 reps - 30 sec hold - Seated Sciatic Tensioner  - 1 x daily - 7 x weekly - 1 sets - 10 reps - 5 sec hold - Seated Long Arc Quad  - 1 x daily - 7 x weekly - 2 sets - 10 reps - Supine Posterior Pelvic Tilt  - 1 x daily - 7 x weekly - 2 sets - 10 reps - Supine March  - 1 x daily - 7 x weekly - 2 sets - 10 reps - Supine Lower Trunk Rotation  - 1 x daily - 7 x weekly - 2 sets - 10 reps - Supine Dead Bug with Leg Extension  - 1 x daily - 7 x weekly - 2 sets - 10 reps    ASSESSMENT:   CLINICAL IMPRESSION: Ms Mallinger presents to skilled PT reporting improved pain with using NuStep.  Pt reported no difficulty with strengthening exercises that progressed last session, so proceeded with using 2#, like last time.  Pt continue state that she is feeling much better than when she initially presented for evaluation.  Pt continues to require skilled PT to progress towards goal related activities.     OBJECTIVE IMPAIRMENTS difficulty walking, decreased strength, increased muscle spasms, and pain.    ACTIVITY LIMITATIONS  lifting, standing, and locomotion level   PARTICIPATION LIMITATIONS: cleaning, driving, and shopping   PERSONAL FACTORS Age and 1-2 comorbidities: GERD, Squamous Cell Carcinoma  are also affecting patient's functional outcome.    REHAB POTENTIAL: Good   CLINICAL DECISION MAKING: Evolving/moderate complexity   EVALUATION COMPLEXITY: Moderate     GOALS: Goals reviewed with patient? Yes   SHORT TERM GOALS: Target date: 05/15/2022   Pt will be independent with initial HEP. Baseline: Goal status: Goal Met 05/23/22   2.  Pt will report at least a 40% improvement in symptoms. Baseline:  Goal status: Goal Met 06/01/22     LONG TERM GOALS: Target date: 08/09/2022   Pt will be independent with advanced HEP. Baseline:  Goal status: ONGOING   2.  Pt will improve modified oswestry to 30% or less to demonstrate improvements with functional mobility. Baseline: 60% Goal status: Ongoing (see above)   3.  Pt will increase right hip strength to at least 4+/5 to improve ability to transfer her husband. Baseline:  Goal status: Ongoing (see above)   4.  Pt will report no increased pain when caring for her husband, including transferring her husband. Baseline:  Goal status: Ongoing      PLAN: PT FREQUENCY: 1-2x/week   PT DURATION: 8 weeks   PLANNED INTERVENTIONS: Therapeutic exercises, Therapeutic activity, Neuromuscular re-education, Balance training, Gait training, Patient/Family education, Self Care, Joint mobilization, Joint manipulation, Stair training, Aquatic Therapy, Dry Needling, Electrical stimulation, Cryotherapy, Moist heat, Taping, Ultrasound, Ionotophoresis 64m/ml Dexamethasone, Manual therapy, and Re-evaluation.   PLAN FOR NEXT SESSION: assess and progress HEP as indicated, strengthening, flexibility, core stability      SJuel Burrow PT 06/21/22 1:25 PM  [facility] [address] [place]illsvilleGMartin [place] [address]Phone #  [phone] [phone]

## 2022-06-26 ENCOUNTER — Encounter: Payer: Self-pay | Admitting: Rehabilitative and Restorative Service Providers"

## 2022-06-26 ENCOUNTER — Ambulatory Visit: Payer: Medicare HMO | Admitting: Rehabilitative and Restorative Service Providers"

## 2022-06-26 DIAGNOSIS — R252 Cramp and spasm: Secondary | ICD-10-CM

## 2022-06-26 DIAGNOSIS — M6281 Muscle weakness (generalized): Secondary | ICD-10-CM

## 2022-06-26 DIAGNOSIS — R262 Difficulty in walking, not elsewhere classified: Secondary | ICD-10-CM

## 2022-06-26 DIAGNOSIS — R2689 Other abnormalities of gait and mobility: Secondary | ICD-10-CM

## 2022-06-26 NOTE — Therapy (Signed)
OUTPATIENT PHYSICAL THERAPY TREATMENT NOTE   Patient Name: Rachel Nichols MRN: [id number] DOB:1931/03/30, 88 y.o., female Today's Date: 06/26/2022  PCP: Isaac Bliss, Rayford Halsted, MD REFERRING PROVIDER: Isaac Bliss, Rayford Halsted, MD      END OF SESSION:   PT End of Session - 06/26/22 0937     Visit Number 17    Date for PT Re-Evaluation 08/10/22    Authorization Type [employer]    Progress Note Due on Visit 20    PT Start Time 0930    PT Stop Time 1010    PT Time Calculation (min) 40 min    Activity Tolerance Patient tolerated treatment well;Patient limited by pain    Behavior During Therapy [facility] for tasks assessed/performed              Past Medical History:  Diagnosis Date   Adenomatous colon polyp 2002   Colonoscopy    Benign neoplasm of colon 2004   Colonoscopy   CAD (coronary artery disease)    echo 09/13/10- EF>55%, aortic valve is mildly sclerotic; myoview 02-20-12-no ischemia   Carotid bruit    carotid dopplers 05/16/04- normal   Diverticulosis 2002,2004,2007   Colonoscopy   Duodenitis    Fibrocystic breast disease    Gastritis    GERD (gastroesophageal reflux disease)    Heart attack ([place]) 1993   Cardiolyte stress---  stents 2012   Hiatal hernia    History of blood transfusion    pt denies   Hyperlipidemia    Peptic ulcer due to Helicobacter pylori    PUD (peptic ulcer disease)    SCC (squamous cell carcinoma) 04/24/2011   right jawline (CX35FU), left forearm (CX35FU)   Past Surgical History:  Procedure Laterality Date   [place]     From the nose   CATARACT EXTRACTION  10/26/09   Right eye   CATARACT EXTRACTION  11/09/09   Left eye   CHOLECYSTECTOMY  1984   CORONARY STENT PLACEMENT  01/10/11   hig grade ramus branch stenosis with mod LAD dz, PCI/stenting of ramus branch with a Resolute DES    DILATION AND CURETTAGE OF UTERUS     FINGER SURGERY  04/2011   Cyst removal, right hand  forefinger   LEFT OOPHORECTOMY  1957   SQUAMOUS CELL CARCINOMA EXCISION Right 07/24/2018   Posterior forearm shave & ED&C   Patient Active Problem List   Diagnosis Date Noted   IGT (impaired glucose tolerance) 01/12/2021   Hyperlipidemia LDL goal <70 01/13/2015   Murmur 01/13/2015   RUQ abdominal pain 01/22/2014   Pancreatic cyst 01/22/2014   Essential hypertension 03/06/2013   CAD S/P percutaneous coronary angioplasty 04/14/2012   GASTRITIS 10/19/2009   ABDOMINAL PAIN-EPIGASTRIC 10/18/2009   CHOLECYSTECTOMY, HX OF 10/18/2009   DIVERTICULOSIS, COLON 10/14/2009   COLONIC POLYPS, ADENOMATOUS, HX OF 10/14/2009   HIATAL HERNIA WITH REFLUX 09/19/2009   WEIGHT LOSS 09/19/2009   PUD, HX OF 09/19/2009   BACK PAIN 05/27/2008    REFERRING DIAG: M54.31 (ICD-10-CM) - Right sided sciatica  THERAPY DIAG:  Muscle weakness (generalized)  Other abnormalities of gait and mobility  Cramp and spasm  Difficulty in walking, not elsewhere classified  Rationale for Evaluation and Treatment Rehabilitation  PERTINENT HISTORY: None  PRECAUTIONS: none  SUBJECTIVE: Pt reports some pain this morning, but states that she is overall feeling better.  PAIN:  Are you having pain? Yes: NPRS scale: 0-6/10 Pain  location: back of leg Pain description: radiating Aggravating factors: standing  Relieving factors: rest, meds   OBJECTIVE: (objective measures completed at initial evaluation unless otherwise dated)  OBJECTIVE:    DIAGNOSTIC FINDINGS:  N/A   PATIENT SURVEYS:  04/24/22:  Modified Oswestry 30 / 50 = 60.0 %           06/01/22:  Modified Oswestry Low Back Pain Disability Questionnaire: 28 / 50 = 56.0 %  06/12/2022:  Modified Oswestry Low Back Pain Disability Questionnaire: 26 / 50 = 52.0 %            SENSATION: Reports numbness and tingling down right leg   MUSCLE LENGTH: Right hamstring tightness noted.   POSTURE: rounded shoulders and forward head   PALPATION: Muscle spasms noted  in lumbar multifidi   LUMBAR ROM: Limited at least 50% secondary to pain     LOWER EXTREMITY MMT:   Eval:  Right hip 4-/5 grossly throughout 06/12/2022:  R hip 4/5 grossly throughout    LUMBAR SPECIAL TESTS:  Slump test: Positive   FUNCTIONAL TESTS:  04/24/2022:  5 times sit to stand: 16.2 sec pushing up from thighs with 10/10 pain  05/15/2022:   3 min walk test: 189 ft with SPC.  Pt had to sit down at 2.5 minutes and sat for the last 30 sec of the assessment  05/25/2022: 3 min walk test: 284 ft with [facility].   06/01/22: 5 times sit to/from stand:  13.9 sec with UE pushing up from thighs and reports of 7/10 pain TUG: 12.4 sec with [facility]  06/12/2022: 3 min walk test: 438 ft with SPC   GAIT: Distance walked: 75 ft Assistive device utilized: None Level of assistance: Complete Independence Comments: Antalgic gait pattern noted with decreased step length       TODAY'S TREATMENT: 06/26/2022: Nustep level 3 (seat at 7, blue machine), x8 min.  PT present to discuss status Seated:  LAQ and marching with 2.5#, hip adduction ball squeeze, heel/toe raises.  BLE 2x10 each Seated on dynadisc:  4 way pelvic tilts, then CW and CCW rotations.  2x10 each Seated HS curls with red tband 2x10 bilat Seated holding yellow plyoball:  hip to hip, hip to shoulder.  2x10 each Seated table hamstring stretch with ankle pumps for sciatic nerve flossing 2x20 sec bilat   06/21/2022: Nustep level 3 (seat at 7, blue machine), x8 min.  PT present to discuss status Seated:  LAQ and marching with 2#, hip adduction ball squeeze, heel/toe raises, hip abduction scissors.  BLE 2x10 each Seated HS curls with red tband 2x10 bilat Seated on dynadisc:  4 way pelvic tilts 2x10 each Lower trunk rotation 2x10 Supine PPT with marching 2x10 Supine sciatic nerve floss with strap x5 bilat (5 "flosses" each time with PT also supporting LE) Sidelying clamshells 2x10 reps bilat   06/18/2022: Nustep level 5 (seat at 7, green  machine), x7 min.  PT present to discuss status Seated:  LAQ and marching with 2#, hip adduction ball squeeze, heel/toe raises, hip abduction scissors.  BLE 2x10 each Seated holding yellow plyoball:  hip to hip, hip to shoulder.  2x10 each Seated HS curls with red tband 2x10 bilat Sit to/from stand 2x5 with minimal UE use Seated on dynadisc:  4 way pelvic tilts 2x10 each Seated lumbar extension 2x10 Ambulation down PT hallway and back with SPC without loss of balance or reports of increased pain.      PATIENT EDUCATION:  Education details: Issued HEP  Person educated: Patient Education method: Explanation, Demonstration, and Handouts Education comprehension: verbalized understanding and returned demonstration     HOME EXERCISE PROGRAM: Access Code: [facility] URL: [email or web address] Date: 05/01/2022 Prepared by: Candyce Churn  Exercises - Seated Hamstring Stretch with Chair  - 1 x daily - 7 x weekly - 1 sets - 3 reps - 30 sec hold - Seated Sciatic Tensioner  - 1 x daily - 7 x weekly - 1 sets - 10 reps - 5 sec hold - Seated Long Arc Quad  - 1 x daily - 7 x weekly - 2 sets - 10 reps - Supine Posterior Pelvic Tilt  - 1 x daily - 7 x weekly - 2 sets - 10 reps - Supine March  - 1 x daily - 7 x weekly - 2 sets - 10 reps - Supine Lower Trunk Rotation  - 1 x daily - 7 x weekly - 2 sets - 10 reps - Supine Dead Bug with Leg Extension  - 1 x daily - 7 x weekly - 2 sets - 10 reps    ASSESSMENT:   CLINICAL IMPRESSION: Ms Heideman presents to skilled PT reporting improved pain with using NuStep, stating 0/10 following.  Pt overall reports that she has made approx 90% improvements since initial PT evaluation.  She states that she is overall feeling stronger and can tell that the exercises are helping her.  Pt continues to have hamstring tightness with right greater than left.     OBJECTIVE IMPAIRMENTS difficulty walking, decreased strength, increased muscle spasms, and pain.     ACTIVITY LIMITATIONS lifting, standing, and locomotion level   PARTICIPATION LIMITATIONS: cleaning, driving, and shopping   PERSONAL FACTORS Age and 1-2 comorbidities: GERD, Squamous Cell Carcinoma  are also affecting patient's functional outcome.    REHAB POTENTIAL: Good   CLINICAL DECISION MAKING: Evolving/moderate complexity   EVALUATION COMPLEXITY: Moderate     GOALS: Goals reviewed with patient? Yes   SHORT TERM GOALS: Target date: 05/15/2022   Pt will be independent with initial HEP. Baseline: Goal status: Goal Met 05/23/22   2.  Pt will report at least a 40% improvement in symptoms. Baseline:  Goal status: Goal Met 06/01/22     LONG TERM GOALS: Target date: 08/09/2022   Pt will be independent with advanced HEP. Baseline:  Goal status: ONGOING   2.  Pt will improve modified oswestry to 30% or less to demonstrate improvements with functional mobility. Baseline: 60% Goal status: Ongoing (see above)   3.  Pt will increase right hip strength to at least 4+/5 to improve ability to transfer her husband. Baseline:  Goal status: Ongoing (see above)   4.  Pt will report no increased pain when caring for her husband, including transferring her husband. Baseline:  Goal status: Ongoing      PLAN: PT FREQUENCY: 1-2x/week   PT DURATION: 8 weeks   PLANNED INTERVENTIONS: Therapeutic exercises, Therapeutic activity, Neuromuscular re-education, Balance training, Gait training, Patient/Family education, Self Care, Joint mobilization, Joint manipulation, Stair training, Aquatic Therapy, Dry Needling, Electrical stimulation, Cryotherapy, Moist heat, Taping, Ultrasound, Ionotophoresis 51m/ml Dexamethasone, Manual therapy, and Re-evaluation.   PLAN FOR NEXT SESSION: assess and progress HEP as indicated, strengthening, flexibility, core stability      SJuel Burrow PT 06/26/22 10:14 AM  [facility] [address] [place]  [address]Phone # [phone] [phone]

## 2022-06-28 ENCOUNTER — Encounter: Payer: Self-pay | Admitting: Rehabilitative and Restorative Service Providers"

## 2022-06-28 ENCOUNTER — Ambulatory Visit: Payer: Medicare HMO | Admitting: Rehabilitative and Restorative Service Providers"

## 2022-06-28 DIAGNOSIS — R2689 Other abnormalities of gait and mobility: Secondary | ICD-10-CM

## 2022-06-28 DIAGNOSIS — R252 Cramp and spasm: Secondary | ICD-10-CM | POA: Diagnosis not present

## 2022-06-28 DIAGNOSIS — R262 Difficulty in walking, not elsewhere classified: Secondary | ICD-10-CM

## 2022-06-28 DIAGNOSIS — M6281 Muscle weakness (generalized): Secondary | ICD-10-CM

## 2022-06-28 NOTE — Therapy (Signed)
OUTPATIENT PHYSICAL THERAPY TREATMENT NOTE   Patient Name: Rachel Nichols MRN: [id number] DOB:Mar 01, 1931, 88 y.o., female Today's Date: 06/28/2022  PCP: Isaac Bliss, Rayford Halsted, MD REFERRING PROVIDER: Isaac Bliss, Rayford Halsted, MD      END OF SESSION:   PT End of Session - 06/28/22 0934     Visit Number 18    Date for PT Re-Evaluation 08/10/22    Authorization Type [employer]    Progress Note Due on Visit 20    PT Start Time 0930    PT Stop Time 1010    PT Time Calculation (min) 40 min    Activity Tolerance Patient tolerated treatment well;Patient limited by pain    Behavior During Therapy [facility] for tasks assessed/performed              Past Medical History:  Diagnosis Date   Adenomatous colon polyp 2002   Colonoscopy    Benign neoplasm of colon 2004   Colonoscopy   CAD (coronary artery disease)    echo 09/13/10- EF>55%, aortic valve is mildly sclerotic; myoview 02-20-12-no ischemia   Carotid bruit    carotid dopplers 05/16/04- normal   Diverticulosis 2002,2004,2007   Colonoscopy   Duodenitis    Fibrocystic breast disease    Gastritis    GERD (gastroesophageal reflux disease)    Heart attack ([place]) 1993   Cardiolyte stress---  stents 2012   Hiatal hernia    History of blood transfusion    pt denies   Hyperlipidemia    Peptic ulcer due to Helicobacter pylori    PUD (peptic ulcer disease)    SCC (squamous cell carcinoma) 04/24/2011   right jawline (CX35FU), left forearm (CX35FU)   Past Surgical History:  Procedure Laterality Date   [place]     From the nose   CATARACT EXTRACTION  10/26/09   Right eye   CATARACT EXTRACTION  11/09/09   Left eye   CHOLECYSTECTOMY  1984   CORONARY STENT PLACEMENT  01/10/11   hig grade ramus branch stenosis with mod LAD dz, PCI/stenting of ramus branch with a Resolute DES    DILATION AND CURETTAGE OF UTERUS     FINGER SURGERY  04/2011   Cyst removal, right hand  forefinger   LEFT OOPHORECTOMY  1957   SQUAMOUS CELL CARCINOMA EXCISION Right 07/24/2018   Posterior forearm shave & ED&C   Patient Active Problem List   Diagnosis Date Noted   IGT (impaired glucose tolerance) 01/12/2021   Hyperlipidemia LDL goal <70 01/13/2015   Murmur 01/13/2015   RUQ abdominal pain 01/22/2014   Pancreatic cyst 01/22/2014   Essential hypertension 03/06/2013   CAD S/P percutaneous coronary angioplasty 04/14/2012   GASTRITIS 10/19/2009   ABDOMINAL PAIN-EPIGASTRIC 10/18/2009   CHOLECYSTECTOMY, HX OF 10/18/2009   DIVERTICULOSIS, COLON 10/14/2009   COLONIC POLYPS, ADENOMATOUS, HX OF 10/14/2009   HIATAL HERNIA WITH REFLUX 09/19/2009   WEIGHT LOSS 09/19/2009   PUD, HX OF 09/19/2009   BACK PAIN 05/27/2008    REFERRING DIAG: M54.31 (ICD-10-CM) - Right sided sciatica  THERAPY DIAG:  Muscle weakness (generalized)  Other abnormalities of gait and mobility  Cramp and spasm  Difficulty in walking, not elsewhere classified  Rationale for Evaluation and Treatment Rehabilitation  PERTINENT HISTORY: None  PRECAUTIONS: none  SUBJECTIVE: Pt reports some increased pain this morning of 7/10.  States that she has been trying to remember to do her HEP, but mornings are most  painful for her.  PAIN:  Are you having pain? Yes: NPRS scale: 0-7/10 Pain location: back of leg Pain description: radiating Aggravating factors: standing  Relieving factors: rest, meds   OBJECTIVE: (objective measures completed at initial evaluation unless otherwise dated)  OBJECTIVE:    DIAGNOSTIC FINDINGS:  N/A   PATIENT SURVEYS:  04/24/22:  Modified Oswestry 30 / 50 = 60.0 %           06/01/22:  Modified Oswestry Low Back Pain Disability Questionnaire: 28 / 50 = 56.0 %  06/12/2022:  Modified Oswestry Low Back Pain Disability Questionnaire: 26 / 50 = 52.0 %            SENSATION: Reports numbness and tingling down right leg   MUSCLE LENGTH: Right hamstring tightness noted.   POSTURE:  rounded shoulders and forward head   PALPATION: Muscle spasms noted in lumbar multifidi   LUMBAR ROM: Limited at least 50% secondary to pain     LOWER EXTREMITY MMT:   Eval:  Right hip 4-/5 grossly throughout 06/12/2022:  R hip 4/5 grossly throughout    LUMBAR SPECIAL TESTS:  Slump test: Positive   FUNCTIONAL TESTS:  04/24/2022:  5 times sit to stand: 16.2 sec pushing up from thighs with 10/10 pain  05/15/2022:   3 min walk test: 189 ft with SPC.  Pt had to sit down at 2.5 minutes and sat for the last 30 sec of the assessment  05/25/2022: 3 min walk test: 284 ft with [facility].   06/01/22: 5 times sit to/from stand:  13.9 sec with UE pushing up from thighs and reports of 7/10 pain TUG: 12.4 sec with [facility]  06/12/2022: 3 min walk test: 438 ft with [facility]  06/28/2022: 3 min walk test: 480 ft with SPC   GAIT: Distance walked: 75 ft Assistive device utilized: None Level of assistance: Complete Independence Comments: Antalgic gait pattern noted with decreased step length       TODAY'S TREATMENT: 06/28/2022: Nustep level 5 (seat at 7, green machine), x8 min.  PT present to discuss status Seated on dynadisc:  4 way pelvic tilts, then CW and CCW rotations.  2x10 each 3 min walk with SPC (480 ft) Seated:  LAQ and marching with 2.5#, hip adduction ball squeeze, heel/toe raises, hip ER.  BLE 2x10 each Seated HS curls with red tband 2x10 bilat Seated holding yellow plyoball:  hip to hip, hip to shoulder.  2x10 each Sit to/from stand holding yellow plyoball 2x5 stands   06/26/2022: Nustep level 3 (seat at 7, blue machine), x8 min.  PT present to discuss status Seated:  LAQ and marching with 2.5#, hip adduction ball squeeze, heel/toe raises.  BLE 2x10 each Seated on dynadisc:  4 way pelvic tilts, then CW and CCW rotations.  2x10 each Seated HS curls with red tband 2x10 bilat Seated holding yellow plyoball:  hip to hip, hip to shoulder.  2x10 each Seated table hamstring stretch with ankle pumps  for sciatic nerve flossing 2x20 sec bilat   06/21/2022: Nustep level 3 (seat at 7, blue machine), x8 min.  PT present to discuss status Seated:  LAQ and marching with 2#, hip adduction ball squeeze, heel/toe raises, hip abduction scissors.  BLE 2x10 each Seated HS curls with red tband 2x10 bilat Seated on dynadisc:  4 way pelvic tilts 2x10 each Lower trunk rotation 2x10 Supine PPT with marching 2x10 Supine sciatic nerve floss with strap x5 bilat (5 "flosses" each time with PT also supporting LE) Sidelying  clamshells 2x10 reps bilat     PATIENT EDUCATION:  Education details: Issued HEP Person educated: Patient Education method: Explanation, Demonstration, and Handouts Education comprehension: verbalized understanding and returned demonstration     HOME EXERCISE PROGRAM: Access Code: [facility] URL: [email or web address] Date: 06/28/2022 Prepared by: Shelby Dubin Reika Callanan  Exercises - Seated Hamstring Stretch with Chair  - 1 x daily - 7 x weekly - 1 sets - 3 reps - 30 sec hold - Seated Sciatic Tensioner  - 1 x daily - 7 x weekly - 1 sets - 10 reps - 5 sec hold - Seated Long Arc Quad  - 1 x daily - 7 x weekly - 2 sets - 10 reps - Sit to Stand Without Arm Support  - 1 x daily - 7 x weekly - 2 sets - 5 reps - Seated Hip External Rotation AROM  - 1 x daily - 7 x weekly - 2 sets - 10 reps - Supine Posterior Pelvic Tilt  - 1 x daily - 7 x weekly - 2 sets - 10 reps - Supine March  - 1 x daily - 7 x weekly - 2 sets - 10 reps - Supine Lower Trunk Rotation  - 1 x daily - 7 x weekly - 2 sets - 10 reps - Supine Dead Bug with Leg Extension  - 1 x daily - 7 x weekly - 2 sets - 10 reps    ASSESSMENT:   CLINICAL IMPRESSION: Ms Wieczorek presents to skilled PT reporting improved pain with using NuStep and therex.  At completion of session, pt states that pain has decreased from 7/10 to 4/10. Pt has made progress with increased distance noted in 3 min walk test.  Pt continues to require skilled  PT to progress towards goal related activities.     OBJECTIVE IMPAIRMENTS difficulty walking, decreased strength, increased muscle spasms, and pain.    ACTIVITY LIMITATIONS lifting, standing, and locomotion level   PARTICIPATION LIMITATIONS: cleaning, driving, and shopping   PERSONAL FACTORS Age and 1-2 comorbidities: GERD, Squamous Cell Carcinoma  are also affecting patient's functional outcome.    REHAB POTENTIAL: Good   CLINICAL DECISION MAKING: Evolving/moderate complexity   EVALUATION COMPLEXITY: Moderate     GOALS: Goals reviewed with patient? Yes   SHORT TERM GOALS: Target date: 05/15/2022   Pt will be independent with initial HEP. Baseline: Goal status: Goal Met 05/23/22   2.  Pt will report at least a 40% improvement in symptoms. Baseline:  Goal status: Goal Met 06/01/22     LONG TERM GOALS: Target date: 08/09/2022   Pt will be independent with advanced HEP. Baseline:  Goal status: ONGOING   2.  Pt will improve modified oswestry to 30% or less to demonstrate improvements with functional mobility. Baseline: 60% Goal status: Ongoing (see above)   3.  Pt will increase right hip strength to at least 4+/5 to improve ability to transfer her husband. Baseline:  Goal status: Ongoing (see above)   4.  Pt will report no increased pain when caring for her husband, including transferring her husband. Baseline:  Goal status: Ongoing      PLAN: PT FREQUENCY: 1-2x/week   PT DURATION: 8 weeks   PLANNED INTERVENTIONS: Therapeutic exercises, Therapeutic activity, Neuromuscular re-education, Balance training, Gait training, Patient/Family education, Self Care, Joint mobilization, Joint manipulation, Stair training, Aquatic Therapy, Dry Needling, Electrical stimulation, Cryotherapy, Moist heat, Taping, Ultrasound, Ionotophoresis 75m/ml Dexamethasone, Manual therapy, and Re-evaluation.   PLAN FOR NEXT SESSION: assess and progress HEP as  indicated, strengthening, flexibility,  core stability      Juel Burrow, PT 06/28/22 10:17 AM  [facility] [address], [place], [place] [address] Phone # [phone] Fax [phone]

## 2022-07-02 ENCOUNTER — Encounter: Payer: Self-pay | Admitting: Rehabilitative and Restorative Service Providers"

## 2022-07-02 ENCOUNTER — Ambulatory Visit: Payer: Medicare HMO | Admitting: Rehabilitative and Restorative Service Providers"

## 2022-07-02 DIAGNOSIS — R252 Cramp and spasm: Secondary | ICD-10-CM | POA: Diagnosis not present

## 2022-07-02 DIAGNOSIS — R262 Difficulty in walking, not elsewhere classified: Secondary | ICD-10-CM

## 2022-07-02 DIAGNOSIS — R2689 Other abnormalities of gait and mobility: Secondary | ICD-10-CM | POA: Diagnosis not present

## 2022-07-02 DIAGNOSIS — M6281 Muscle weakness (generalized): Secondary | ICD-10-CM | POA: Diagnosis not present

## 2022-07-02 NOTE — Therapy (Signed)
OUTPATIENT PHYSICAL THERAPY TREATMENT NOTE   Patient Name: Rachel Nichols MRN: [id number] DOB:07-22-1931, 88 y.o., female Today's Date: 07/02/2022  PCP: Isaac Bliss, Rayford Halsted, MD REFERRING PROVIDER: Isaac Bliss, Rayford Halsted, MD      END OF SESSION:   PT End of Session - 07/02/22 1104     Visit Number 19    Date for PT Re-Evaluation 08/10/22    Authorization Type [employer] Medicare    Progress Note Due on Visit 66    PT Start Time 28    PT Stop Time 1138    PT Time Calculation (min) 38 min    Activity Tolerance Patient tolerated treatment well;Patient limited by pain    Behavior During Therapy [facility] for tasks assessed/performed              Past Medical History:  Diagnosis Date   Adenomatous colon polyp 2002   Colonoscopy    Benign neoplasm of colon 2004   Colonoscopy   CAD (coronary artery disease)    echo 09/13/10- EF>55%, aortic valve is mildly sclerotic; myoview 02-20-12-no ischemia   Carotid bruit    carotid dopplers 05/16/04- normal   Diverticulosis 2002,2004,2007   Colonoscopy   Duodenitis    Fibrocystic breast disease    Gastritis    GERD (gastroesophageal reflux disease)    Heart attack ([place]) 1993   Cardiolyte stress---  stents 2012   Hiatal hernia    History of blood transfusion    pt denies   Hyperlipidemia    Peptic ulcer due to Helicobacter pylori    PUD (peptic ulcer disease)    SCC (squamous cell carcinoma) 04/24/2011   right jawline (CX35FU), left forearm (CX35FU)   Past Surgical History:  Procedure Laterality Date   [place]     From the nose   CATARACT EXTRACTION  10/26/09   Right eye   CATARACT EXTRACTION  11/09/09   Left eye   CHOLECYSTECTOMY  1984   CORONARY STENT PLACEMENT  01/10/11   hig grade ramus branch stenosis with mod LAD dz, PCI/stenting of ramus branch with a Resolute DES    DILATION AND CURETTAGE OF UTERUS     FINGER SURGERY  04/2011   Cyst removal, right hand  forefinger   LEFT OOPHORECTOMY  1957   SQUAMOUS CELL CARCINOMA EXCISION Right 07/24/2018   Posterior forearm shave & ED&C   Patient Active Problem List   Diagnosis Date Noted   IGT (impaired glucose tolerance) 01/12/2021   Hyperlipidemia LDL goal <70 01/13/2015   Murmur 01/13/2015   RUQ abdominal pain 01/22/2014   Pancreatic cyst 01/22/2014   Essential hypertension 03/06/2013   CAD S/P percutaneous coronary angioplasty 04/14/2012   GASTRITIS 10/19/2009   ABDOMINAL PAIN-EPIGASTRIC 10/18/2009   CHOLECYSTECTOMY, HX OF 10/18/2009   DIVERTICULOSIS, COLON 10/14/2009   COLONIC POLYPS, ADENOMATOUS, HX OF 10/14/2009   HIATAL HERNIA WITH REFLUX 09/19/2009   WEIGHT LOSS 09/19/2009   PUD, HX OF 09/19/2009   BACK PAIN 05/27/2008    REFERRING DIAG: M54.31 (ICD-10-CM) - Right sided sciatica  THERAPY DIAG:  Muscle weakness (generalized)  Other abnormalities of gait and mobility  Cramp and spasm  Difficulty in walking, not elsewhere classified  Rationale for Evaluation and Treatment Rehabilitation  PERTINENT HISTORY: None  PRECAUTIONS: none  SUBJECTIVE: Pt reports some increased pain this morning of 7/10 this morning.  PAIN:  Are you having pain? Yes: NPRS scale: 7/10 Pain location: back of  leg Pain description: radiating Aggravating factors: standing  Relieving factors: rest, meds   OBJECTIVE: (objective measures completed at initial evaluation unless otherwise dated)  OBJECTIVE:    DIAGNOSTIC FINDINGS:  N/A   PATIENT SURVEYS:  04/24/22:  Modified Oswestry 30 / 50 = 60.0 %           06/01/22:  Modified Oswestry Low Back Pain Disability Questionnaire: 28 / 50 = 56.0 %  06/12/2022:  Modified Oswestry Low Back Pain Disability Questionnaire: 26 / 50 = 52.0 %            SENSATION: Reports numbness and tingling down right leg   MUSCLE LENGTH: Right hamstring tightness noted.   POSTURE: rounded shoulders and forward head   PALPATION: Muscle spasms noted in lumbar  multifidi   LUMBAR ROM: Limited at least 50% secondary to pain     LOWER EXTREMITY MMT:   Eval:  Right hip 4-/5 grossly throughout 06/12/2022:  R hip 4/5 grossly throughout    LUMBAR SPECIAL TESTS:  Slump test: Positive   FUNCTIONAL TESTS:  04/24/2022:  5 times sit to stand: 16.2 sec pushing up from thighs with 10/10 pain  05/15/2022:   3 min walk test: 189 ft with SPC.  Pt had to sit down at 2.5 minutes and sat for the last 30 sec of the assessment  05/25/2022: 3 min walk test: 284 ft with [facility].   06/01/22: 5 times sit to/from stand:  13.9 sec with UE pushing up from thighs and reports of 7/10 pain TUG: 12.4 sec with [facility]  06/12/2022: 3 min walk test: 438 ft with [facility]  06/28/2022: 3 min walk test: 480 ft with SPC   GAIT: Distance walked: 75 ft Assistive device utilized: None Level of assistance: Complete Independence Comments: Antalgic gait pattern noted with decreased step length       TODAY'S TREATMENT: 07/02/2022: Nustep level 4 (seat at 9, blue machine), x8 min.  PT present to discuss status Seated table hamstring stretch 2x20 sec bilat Seated on dynadisc:  4 way pelvic tilts, then CW and CCW rotations.  2x10 each Right Sidelying manual therapy with [place] to lumbar multifidi, glutes, piriformis, hamstring, and IT band. Sidelying clamshells with green loop 2x10 bilat Supine posterior pelvic tilt 2x10 Ambulation with SPC around PT gym with cuing for improved posture.   06/28/2022: Nustep level 5 (seat at 7, green machine), x8 min.  PT present to discuss status Seated on dynadisc:  4 way pelvic tilts, then CW and CCW rotations.  2x10 each 3 min walk with SPC (480 ft) Seated:  LAQ and marching with 2.5#, hip adduction ball squeeze, heel/toe raises, hip ER.  BLE 2x10 each Seated HS curls with red tband 2x10 bilat Seated holding yellow plyoball:  hip to hip, hip to shoulder.  2x10 each Sit to/from stand holding yellow plyoball 2x5 stands   06/26/2022: Nustep level 3  (seat at 7, blue machine), x8 min.  PT present to discuss status Seated:  LAQ and marching with 2.5#, hip adduction ball squeeze, heel/toe raises.  BLE 2x10 each Seated on dynadisc:  4 way pelvic tilts, then CW and CCW rotations.  2x10 each Seated HS curls with red tband 2x10 bilat Seated holding yellow plyoball:  hip to hip, hip to shoulder.  2x10 each Seated table hamstring stretch with ankle pumps for sciatic nerve flossing 2x20 sec bilat     PATIENT EDUCATION:  Education details: Issued HEP Person educated: Patient Education method: Explanation, Media planner, and Handouts Education comprehension:  verbalized understanding and returned demonstration     HOME EXERCISE PROGRAM: Access Code: [facility] URL: [email or web address] Date: 06/28/2022 Prepared by: Shelby Dubin Jamion Carter  Exercises - Seated Hamstring Stretch with Chair  - 1 x daily - 7 x weekly - 1 sets - 3 reps - 30 sec hold - Seated Sciatic Tensioner  - 1 x daily - 7 x weekly - 1 sets - 10 reps - 5 sec hold - Seated Long Arc Quad  - 1 x daily - 7 x weekly - 2 sets - 10 reps - Sit to Stand Without Arm Support  - 1 x daily - 7 x weekly - 2 sets - 5 reps - Seated Hip External Rotation AROM  - 1 x daily - 7 x weekly - 2 sets - 10 reps - Supine Posterior Pelvic Tilt  - 1 x daily - 7 x weekly - 2 sets - 10 reps - Supine March  - 1 x daily - 7 x weekly - 2 sets - 10 reps - Supine Lower Trunk Rotation  - 1 x daily - 7 x weekly - 2 sets - 10 reps - Supine Dead Bug with Leg Extension  - 1 x daily - 7 x weekly - 2 sets - 10 reps    ASSESSMENT:   CLINICAL IMPRESSION: Ms Pereda presents to skilled PT with reports of increased pain today.  Pt educated on the importance of daily exercises, specifically the hamstring stretch with sciatic tensioner.  Pt reports feeling much better at end of session stating that she is not having the increased pain or catching.  Pt will have 20th visit assessment next session.     OBJECTIVE  IMPAIRMENTS difficulty walking, decreased strength, increased muscle spasms, and pain.    ACTIVITY LIMITATIONS lifting, standing, and locomotion level   PARTICIPATION LIMITATIONS: cleaning, driving, and shopping   PERSONAL FACTORS Age and 1-2 comorbidities: GERD, Squamous Cell Carcinoma  are also affecting patient's functional outcome.    REHAB POTENTIAL: Good   CLINICAL DECISION MAKING: Evolving/moderate complexity   EVALUATION COMPLEXITY: Moderate     GOALS: Goals reviewed with patient? Yes   SHORT TERM GOALS: Target date: 05/15/2022   Pt will be independent with initial HEP. Baseline: Goal status: Goal Met 05/23/22   2.  Pt will report at least a 40% improvement in symptoms. Baseline:  Goal status: Goal Met 06/01/22     LONG TERM GOALS: Target date: 08/09/2022   Pt will be independent with advanced HEP. Baseline:  Goal status: ONGOING   2.  Pt will improve modified oswestry to 30% or less to demonstrate improvements with functional mobility. Baseline: 60% Goal status: Ongoing (see above)   3.  Pt will increase right hip strength to at least 4+/5 to improve ability to transfer her husband. Baseline:  Goal status: Ongoing (see above)   4.  Pt will report no increased pain when caring for her husband, including transferring her husband. Baseline:  Goal status: Ongoing      PLAN: PT FREQUENCY: 1-2x/week   PT DURATION: 8 weeks   PLANNED INTERVENTIONS: Therapeutic exercises, Therapeutic activity, Neuromuscular re-education, Balance training, Gait training, Patient/Family education, Self Care, Joint mobilization, Joint manipulation, Stair training, Aquatic Therapy, Dry Needling, Electrical stimulation, Cryotherapy, Moist heat, Taping, Ultrasound, Ionotophoresis 4mg /ml Dexamethasone, Manual therapy, and Re-evaluation.   PLAN FOR NEXT SESSION: assess and progress HEP as indicated, strengthening, flexibility, core stability      Juel Burrow, PT 07/02/22 11:46  AM  [facility] [phone]  [address], [place], [place] [address] Phone # [phone] Fax [phone]

## 2022-07-05 ENCOUNTER — Encounter: Payer: Self-pay | Admitting: Rehabilitative and Restorative Service Providers"

## 2022-07-05 ENCOUNTER — Ambulatory Visit: Payer: Medicare HMO | Admitting: Rehabilitative and Restorative Service Providers"

## 2022-07-05 DIAGNOSIS — R262 Difficulty in walking, not elsewhere classified: Secondary | ICD-10-CM | POA: Diagnosis not present

## 2022-07-05 DIAGNOSIS — R2689 Other abnormalities of gait and mobility: Secondary | ICD-10-CM

## 2022-07-05 DIAGNOSIS — M6281 Muscle weakness (generalized): Secondary | ICD-10-CM

## 2022-07-05 DIAGNOSIS — R252 Cramp and spasm: Secondary | ICD-10-CM | POA: Diagnosis not present

## 2022-07-05 NOTE — Therapy (Signed)
OUTPATIENT PHYSICAL THERAPY TREATMENT NOTE   Patient Name: Rachel Nichols MRN: [id number] DOB:[birth year], 88 y.o., female Today's Date: 07/05/2022  PCP: Isaac Bliss, Rayford Halsted, MD REFERRING PROVIDER: Isaac Bliss, Rayford Halsted, MD    Progress Note Reporting Period 04/24/2022 to 07/05/2022  See note below for Objective Data and Assessment of Progress/Goals.         END OF SESSION:   PT End of Session - 07/05/22 1102     Visit Number 20    Date for PT Re-Evaluation 08/10/22    Authorization Type [employer]    Progress Note Due on Visit 30    PT Start Time 1100    PT Stop Time 1139    PT Time Calculation (min) 39 min    Activity Tolerance Patient tolerated treatment well;Patient limited by pain    Behavior During Therapy WFL for tasks assessed/performed              Past Medical History:  Diagnosis Date   Adenomatous colon polyp 2002   Colonoscopy    Benign neoplasm of colon 2004   Colonoscopy   CAD (coronary artery disease)    echo 09/13/10- EF>55%, aortic valve is mildly sclerotic; myoview 02-20-12-no ischemia   Carotid bruit    carotid dopplers 05/16/04- normal   Diverticulosis 2002,2004,2007   Colonoscopy   Duodenitis    Fibrocystic breast disease    Gastritis    GERD (gastroesophageal reflux disease)    Heart attack ([place]) 1993   Cardiolyte stress---  stents 2012   Hiatal hernia    History of blood transfusion    pt denies   Hyperlipidemia    Peptic ulcer due to Helicobacter pylori    PUD (peptic ulcer disease)    SCC (squamous cell carcinoma) 04/24/2011   right jawline (CX35FU), left forearm (CX35FU)   Past Surgical History:  Procedure Laterality Date   [place]     From the nose   CATARACT EXTRACTION  10/26/09   Right eye   CATARACT EXTRACTION  11/09/09   Left eye   CHOLECYSTECTOMY  1984   CORONARY STENT PLACEMENT  01/10/11   hig grade ramus branch stenosis with mod LAD dz,  PCI/stenting of ramus branch with a Resolute DES    DILATION AND CURETTAGE OF UTERUS     FINGER SURGERY  04/2011   Cyst removal, right hand forefinger   LEFT OOPHORECTOMY  1957   SQUAMOUS CELL CARCINOMA EXCISION Right 07/24/2018   Posterior forearm shave & ED&C   Patient Active Problem List   Diagnosis Date Noted   IGT (impaired glucose tolerance) 01/12/2021   Hyperlipidemia LDL goal <70 01/13/2015   Murmur 01/13/2015   RUQ abdominal pain 01/22/2014   Pancreatic cyst 01/22/2014   Essential hypertension 03/06/2013   CAD S/P percutaneous coronary angioplasty 04/14/2012   GASTRITIS 10/19/2009   ABDOMINAL PAIN-EPIGASTRIC 10/18/2009   CHOLECYSTECTOMY, HX OF 10/18/2009   DIVERTICULOSIS, COLON 10/14/2009   COLONIC POLYPS, ADENOMATOUS, HX OF 10/14/2009   HIATAL HERNIA WITH REFLUX 09/19/2009   WEIGHT LOSS 09/19/2009   PUD, HX OF 09/19/2009   BACK PAIN 05/27/2008    REFERRING DIAG: M54.31 (ICD-10-CM) - Right sided sciatica  THERAPY DIAG:  Muscle weakness (generalized)  Other abnormalities of gait and mobility  Cramp and spasm  Difficulty in walking, not elsewhere classified  Rationale for Evaluation and Treatment Rehabilitation  PERTINENT HISTORY: None  PRECAUTIONS: none  SUBJECTIVE: Pt reports  that she was able to walk around in [place] before her session today for approx 45 min pushing the shopping cart.  PAIN:  Are you having pain? Yes: NPRS scale: 4-5/10 Pain location: back of leg Pain description: radiating Aggravating factors: standing  Relieving factors: rest, meds   OBJECTIVE: (objective measures completed at initial evaluation unless otherwise dated)  OBJECTIVE:    DIAGNOSTIC FINDINGS:  N/A   PATIENT SURVEYS:  04/24/22:  Modified Oswestry 30 / 50 = 60.0 %           06/01/22:  Modified Oswestry Low Back Pain Disability Questionnaire: 28 / 50 = 56.0 %  06/12/2022:  Modified Oswestry Low Back Pain Disability Questionnaire: 26 / 50 = 52.0 % 07/05/2022:  Modified  Oswestry Low Back Pain Disability Questionnaire: 20 / 50 = 40.0 %            SENSATION: Reports numbness and tingling down right leg   MUSCLE LENGTH: Right hamstring tightness noted.   POSTURE: rounded shoulders and forward head   PALPATION: Muscle spasms noted in lumbar multifidi   LUMBAR ROM: Limited at least 50% secondary to pain     LOWER EXTREMITY MMT:   Eval:  Right hip 4-/5 grossly throughout 06/12/2022:  R hip 4/5 grossly throughout    LUMBAR SPECIAL TESTS:  Slump test: Positive   FUNCTIONAL TESTS:  04/24/2022:  5 times sit to stand: 16.2 sec pushing up from thighs with 10/10 pain  05/15/2022:   3 min walk test: 189 ft with SPC.  Pt had to sit down at 2.5 minutes and sat for the last 30 sec of the assessment  05/25/2022: 3 min walk test: 284 ft with [facility].   06/01/22: 5 times sit to/from stand:  13.9 sec with UE pushing up from thighs and reports of 7/10 pain TUG: 12.4 sec with [facility]  06/12/2022: 3 min walk test: 438 ft with [facility]  06/28/2022: 3 min walk test: 480 ft with SPC   GAIT: Distance walked: 75 ft Assistive device utilized: None Level of assistance: Complete Independence Comments: Antalgic gait pattern noted with decreased step length       TODAY'S TREATMENT: 07/05/2022: Modified Oswestry Low Back Pain Disability Questionnaire: 20 / 50 = 40.0 % Seated on dynadisc:  4 way pelvic tilts  2x10 each Seated piriformis stretch 2x20 sec bilat Seated table hamstring stretch 2x20 sec bilat Seated HS curls and clamshells with green tband 2x10 bilat each 3 min walk test: 552 ft without AD Seated holding yellow plyoball:  hip to hip, hip to shoulder.  2x10 each Sit to/from stand holding yellow plyoball 2x5 stands Nustep level 5 (seat at 7, green machine), x6 min.  PT present to discuss status   07/02/2022: Nustep level 4 (seat at 9, blue machine), x8 min.  PT present to discuss status Seated table hamstring stretch 2x20 sec bilat Seated on dynadisc:  4 way pelvic  tilts, then CW and CCW rotations.  2x10 each Right Sidelying manual therapy with Addaday to lumbar multifidi, glutes, piriformis, hamstring, and IT band. Sidelying clamshells with green loop 2x10 bilat Supine posterior pelvic tilt 2x10 Ambulation with SPC around PT gym with cuing for improved posture.   06/28/2022: Nustep level 5 (seat at 7, green machine), x8 min.  PT present to discuss status Seated on dynadisc:  4 way pelvic tilts, then CW and CCW rotations.  2x10 each 3 min walk with SPC (480 ft) Seated:  LAQ and marching with 2.5#, hip adduction ball squeeze,  heel/toe raises, hip ER.  BLE 2x10 each Seated HS curls with red tband 2x10 bilat Seated holding yellow plyoball:  hip to hip, hip to shoulder.  2x10 each Sit to/from stand holding yellow plyoball 2x5 stands      PATIENT EDUCATION:  Education details: Issued HEP Person educated: Patient Education method: Explanation, Demonstration, and Handouts Education comprehension: verbalized understanding and returned demonstration     HOME EXERCISE PROGRAM: Access Code: [facility] URL: [email or web address] Date: 06/28/2022 Prepared by: Shelby Dubin Watson Robarge  Exercises - Seated Hamstring Stretch with Chair  - 1 x daily - 7 x weekly - 1 sets - 3 reps - 30 sec hold - Seated Sciatic Tensioner  - 1 x daily - 7 x weekly - 1 sets - 10 reps - 5 sec hold - Seated Long Arc Quad  - 1 x daily - 7 x weekly - 2 sets - 10 reps - Sit to Stand Without Arm Support  - 1 x daily - 7 x weekly - 2 sets - 5 reps - Seated Hip External Rotation AROM  - 1 x daily - 7 x weekly - 2 sets - 10 reps - Supine Posterior Pelvic Tilt  - 1 x daily - 7 x weekly - 2 sets - 10 reps - Supine March  - 1 x daily - 7 x weekly - 2 sets - 10 reps - Supine Lower Trunk Rotation  - 1 x daily - 7 x weekly - 2 sets - 10 reps - Supine Dead Bug with Leg Extension  - 1 x daily - 7 x weekly - 2 sets - 10 reps    ASSESSMENT:   CLINICAL IMPRESSION: Ms Lenhardt presents to  skilled PT without her [facility] stating that she is feeling better today.  Pt is making progress towards goals with ability to ambulate increased distances on 3 minute walk test without assistive device and improved score noted on Oswestry.  Pt continues to progress with strengthening and able to use green band with exercises today.  Pt continues to require skilled PT to progress towards goal related activities.     OBJECTIVE IMPAIRMENTS difficulty walking, decreased strength, increased muscle spasms, and pain.    ACTIVITY LIMITATIONS lifting, standing, and locomotion level   PARTICIPATION LIMITATIONS: cleaning, driving, and shopping   PERSONAL FACTORS Age and 1-2 comorbidities: GERD, Squamous Cell Carcinoma  are also affecting patient's functional outcome.    REHAB POTENTIAL: Good   CLINICAL DECISION MAKING: Evolving/moderate complexity   EVALUATION COMPLEXITY: Moderate     GOALS: Goals reviewed with patient? Yes   SHORT TERM GOALS: Target date: 05/15/2022   Pt will be independent with initial HEP. Baseline: Goal status: Goal Met 05/23/22   2.  Pt will report at least a 40% improvement in symptoms. Baseline:  Goal status: Goal Met 06/01/22     LONG TERM GOALS: Target date: 08/09/2022   Pt will be independent with advanced HEP. Baseline:  Goal status: ONGOING   2.  Pt will improve modified oswestry to 30% or less to demonstrate improvements with functional mobility. Baseline: 60% Goal status: Ongoing (see above)   3.  Pt will increase right hip strength to at least 4+/5 to improve ability to transfer her husband. Baseline:  Goal status: Ongoing (see above)   4.  Pt will report no increased pain when caring for her husband, including transferring her husband. Baseline:  Goal status: Ongoing      PLAN: PT FREQUENCY: 1-2x/week   PT DURATION: 8 weeks  PLANNED INTERVENTIONS: Therapeutic exercises, Therapeutic activity, Neuromuscular re-education, Balance training, Gait  training, Patient/Family education, Self Care, Joint mobilization, Joint manipulation, Stair training, Aquatic Therapy, Dry Needling, Electrical stimulation, Cryotherapy, Moist heat, Taping, Ultrasound, Ionotophoresis 58m/ml Dexamethasone, Manual therapy, and Re-evaluation.   PLAN FOR NEXT SESSION: assess and progress HEP as indicated, strengthening, flexibility, core stability      SJuel Burrow PT 07/05/22 11:45 AM  BP[place] SFrankfortGJean Laf[place] [address]Phone # [phone] [phone]

## 2022-07-06 ENCOUNTER — Telehealth: Payer: Self-pay | Admitting: Internal Medicine

## 2022-07-06 NOTE — Telephone Encounter (Signed)
Pt is calling and need a new rx escitalopram 5 mg #90 send to  [place], [address] Phone:  [phone]  Fax:  [phone]      Pt said dr Jerilee Hoh prescribed that medication and she was getting med through mail order

## 2022-07-09 ENCOUNTER — Encounter: Payer: Self-pay | Admitting: Rehabilitative and Restorative Service Providers"

## 2022-07-09 ENCOUNTER — Ambulatory Visit: Payer: Medicare HMO | Attending: Internal Medicine | Admitting: Rehabilitative and Restorative Service Providers"

## 2022-07-09 DIAGNOSIS — M6281 Muscle weakness (generalized): Secondary | ICD-10-CM | POA: Diagnosis not present

## 2022-07-09 DIAGNOSIS — R262 Difficulty in walking, not elsewhere classified: Secondary | ICD-10-CM

## 2022-07-09 DIAGNOSIS — R252 Cramp and spasm: Secondary | ICD-10-CM | POA: Diagnosis not present

## 2022-07-09 DIAGNOSIS — R2689 Other abnormalities of gait and mobility: Secondary | ICD-10-CM | POA: Diagnosis not present

## 2022-07-09 NOTE — Telephone Encounter (Signed)
Not on current med list.  Okay to refill?

## 2022-07-09 NOTE — Telephone Encounter (Signed)
Attempted to call the patient, but unable to leave a message

## 2022-07-09 NOTE — Therapy (Signed)
OUTPATIENT PHYSICAL THERAPY TREATMENT NOTE   Patient Name: Rachel Nichols MRN: [id number] DOB:1931-09-02, 88 y.o., female Today's Date: 07/09/2022  PCP: Isaac Bliss, Rayford Halsted, MD REFERRING PROVIDER: Isaac Bliss, Rayford Halsted, MD    Progress Note Reporting Period 04/24/2022 to 07/05/2022  See note below for Objective Data and Assessment of Progress/Goals.         END OF SESSION:   PT End of Session - 07/09/22 1023     Visit Number 21    Date for PT Re-Evaluation 08/10/22    Authorization Type [employer] Medicare    Progress Note Due on Visit 62    PT Start Time 1018    PT Stop Time 1056    PT Time Calculation (min) 38 min    Activity Tolerance Patient tolerated treatment well;Patient limited by pain    Behavior During Therapy WFL for tasks assessed/performed              Past Medical History:  Diagnosis Date   Adenomatous colon polyp 2002   Colonoscopy    Benign neoplasm of colon 2004   Colonoscopy   CAD (coronary artery disease)    echo 09/13/10- EF>55%, aortic valve is mildly sclerotic; myoview 02-20-12-no ischemia   Carotid bruit    carotid dopplers 05/16/04- normal   Diverticulosis 2002,2004,2007   Colonoscopy   Duodenitis    Fibrocystic breast disease    Gastritis    GERD (gastroesophageal reflux disease)    Heart attack ([place]) 1993   Cardiolyte stress---  stents 2012   Hiatal hernia    History of blood transfusion    pt denies   Hyperlipidemia    Peptic ulcer due to Helicobacter pylori    PUD (peptic ulcer disease)    SCC (squamous cell carcinoma) 04/24/2011   right jawline (CX35FU), left forearm (CX35FU)   Past Surgical History:  Procedure Laterality Date   [place]   BASAL CELL CARCINOMA EXCISION     From the nose   CATARACT EXTRACTION  10/26/09   Right eye   CATARACT EXTRACTION  11/09/09   Left eye   CHOLECYSTECTOMY  1984   CORONARY STENT PLACEMENT  01/10/11   hig grade ramus branch stenosis with mod LAD dz,  PCI/stenting of ramus branch with a Resolute DES    DILATION AND CURETTAGE OF UTERUS     FINGER SURGERY  04/2011   Cyst removal, right hand forefinger   LEFT OOPHORECTOMY  1957   SQUAMOUS CELL CARCINOMA EXCISION Right 07/24/2018   Posterior forearm shave & ED&C   Patient Active Problem List   Diagnosis Date Noted   IGT (impaired glucose tolerance) 01/12/2021   Hyperlipidemia LDL goal <70 01/13/2015   Murmur 01/13/2015   RUQ abdominal pain 01/22/2014   Pancreatic cyst 01/22/2014   Essential hypertension 03/06/2013   CAD S/P percutaneous coronary angioplasty 04/14/2012   GASTRITIS 10/19/2009   ABDOMINAL PAIN-EPIGASTRIC 10/18/2009   CHOLECYSTECTOMY, HX OF 10/18/2009   DIVERTICULOSIS, COLON 10/14/2009   COLONIC POLYPS, ADENOMATOUS, HX OF 10/14/2009   HIATAL HERNIA WITH REFLUX 09/19/2009   WEIGHT LOSS 09/19/2009   PUD, HX OF 09/19/2009   BACK PAIN 05/27/2008    REFERRING DIAG: M54.31 (ICD-10-CM) - Right sided sciatica  THERAPY DIAG:  Muscle weakness (generalized)  Other abnormalities of gait and mobility  Cramp and spasm  Difficulty in walking, not elsewhere classified  Rationale for Evaluation and Treatment Rehabilitation  PERTINENT HISTORY: None  PRECAUTIONS: none  SUBJECTIVE: Pt reports  that she was able to walk around in [place] before her session today for approx 45 min pushing the shopping cart.  PAIN:  Are you having pain? Yes: NPRS scale: 4-5/10 Pain location: back of leg Pain description: radiating Aggravating factors: standing  Relieving factors: rest, meds   OBJECTIVE: (objective measures completed at initial evaluation unless otherwise dated)  OBJECTIVE:    DIAGNOSTIC FINDINGS:  N/A   PATIENT SURVEYS:  04/24/22:  Modified Oswestry 30 / 50 = 60.0 %           06/01/22:  Modified Oswestry Low Back Pain Disability Questionnaire: 28 / 50 = 56.0 %  06/12/2022:  Modified Oswestry Low Back Pain Disability Questionnaire: 26 / 50 = 52.0 % 07/05/2022:  Modified  Oswestry Low Back Pain Disability Questionnaire: 20 / 50 = 40.0 %            SENSATION: Reports numbness and tingling down right leg   MUSCLE LENGTH: Right hamstring tightness noted.   POSTURE: rounded shoulders and forward head   PALPATION: Muscle spasms noted in lumbar multifidi   LUMBAR ROM: Limited at least 50% secondary to pain     LOWER EXTREMITY MMT:   Eval:  Right hip 4-/5 grossly throughout 06/12/2022:  R hip 4/5 grossly throughout    LUMBAR SPECIAL TESTS:  Slump test: Positive   FUNCTIONAL TESTS:  04/24/2022:  5 times sit to stand: 16.2 sec pushing up from thighs with 10/10 pain  05/15/2022:   3 min walk test: 189 ft with SPC.  Pt had to sit down at 2.5 minutes and sat for the last 30 sec of the assessment  05/25/2022: 3 min walk test: 284 ft with [facility]   06/01/22: 5 times sit to/from stand:  13.9 sec with UE pushing up from thighs and reports of 7/10 pain TUG: 12.4 sec with [facility]  06/12/2022: 3 min walk test: 438 ft with [facility]  06/28/2022: 3 min walk test: 480 ft with SPC   GAIT: Distance walked: 75 ft Assistive device utilized: None Level of assistance: Complete Independence Comments: Antalgic gait pattern noted with decreased step length       TODAY'S TREATMENT: 07/09/2022: Nustep level 4 (seat at 9, blue machine), x8 min.  PT present to discuss status Leg Press 50# 2x10 Seated piriformis stretch 2x20 sec bilat Seated table hamstring stretch 2x20 sec bilat Seated HS curls and clamshells with green tband 2x10 bilat each Seated holding yellow plyoball:  hip to hip, hip to shoulder.  2x10 each Sit to/from stand holding yellow plyoball 3x5 stands Seated with 2.5#:  LAQ and marching. 2x10 bilat each Seated on dynadisc:  4 way pelvic tilts  2x10 each   07/05/2022: Modified Oswestry Low Back Pain Disability Questionnaire: 20 / 50 = 40.0 % Seated on dynadisc:  4 way pelvic tilts  2x10 each Seated piriformis stretch 2x20 sec bilat Seated table hamstring stretch  2x20 sec bilat Seated HS curls and clamshells with green tband 2x10 bilat each 3 min walk test: 552 ft without AD Seated holding yellow plyoball:  hip to hip, hip to shoulder.  2x10 each Sit to/from stand holding yellow plyoball 2x5 stands Nustep level 5 (seat at 7, green machine), x6 min.  PT present to discuss status   07/02/2022: Nustep level 4 (seat at 9, blue machine), x8 min.  PT present to discuss status Seated table hamstring stretch 2x20 sec bilat Seated on dynadisc:  4 way pelvic tilts, then CW and CCW rotations.  2x10 each Right  Sidelying manual therapy with Addaday to lumbar multifidi, glutes, piriformis, hamstring, and IT band. Sidelying clamshells with green loop 2x10 bilat Supine posterior pelvic tilt 2x10 Ambulation with SPC around PT gym with cuing for improved posture.     PATIENT EDUCATION:  Education details: Issued HEP Person educated: Patient Education method: Explanation, Media planner, and Handouts Education comprehension: verbalized understanding and returned demonstration     HOME EXERCISE PROGRAM: Access Code: [facility] URL: [email or web address] Date: 06/28/2022 Prepared by: Shelby Dubin Abdulrahman Bracey  Exercises - Seated Hamstring Stretch with Chair  - 1 x daily - 7 x weekly - 1 sets - 3 reps - 30 sec hold - Seated Sciatic Tensioner  - 1 x daily - 7 x weekly - 1 sets - 10 reps - 5 sec hold - Seated Long Arc Quad  - 1 x daily - 7 x weekly - 2 sets - 10 reps - Sit to Stand Without Arm Support  - 1 x daily - 7 x weekly - 2 sets - 5 reps - Seated Hip External Rotation AROM  - 1 x daily - 7 x weekly - 2 sets - 10 reps - Supine Posterior Pelvic Tilt  - 1 x daily - 7 x weekly - 2 sets - 10 reps - Supine March  - 1 x daily - 7 x weekly - 2 sets - 10 reps - Supine Lower Trunk Rotation  - 1 x daily - 7 x weekly - 2 sets - 10 reps - Supine Dead Bug with Leg Extension  - 1 x daily - 7 x weekly - 2 sets - 10 reps    ASSESSMENT:   CLINICAL IMPRESSION: Ms  Porco presents to skilled PT with reports of overall feeling better.  States at completion of PT visit today, pt denies any pain and is able to ambulate around PT clinic without her [facility].  Pt continues to progress with strengthening and able to incorporate use of weight machine during session without reports of increased pain.  Pt requires some cuing with exercises for slower speed for improved muscle fiber recruitment.  Pt continues to require skilled PT to progress towards goal related activities.     OBJECTIVE IMPAIRMENTS difficulty walking, decreased strength, increased muscle spasms, and pain.    ACTIVITY LIMITATIONS lifting, standing, and locomotion level   PARTICIPATION LIMITATIONS: cleaning, driving, and shopping   PERSONAL FACTORS Age and 1-2 comorbidities: GERD, Squamous Cell Carcinoma  are also affecting patient's functional outcome.    REHAB POTENTIAL: Good   CLINICAL DECISION MAKING: Evolving/moderate complexity   EVALUATION COMPLEXITY: Moderate     GOALS: Goals reviewed with patient? Yes   SHORT TERM GOALS: Target date: 05/15/2022   Pt will be independent with initial HEP. Baseline: Goal status: Goal Met 05/23/22   2.  Pt will report at least a 40% improvement in symptoms. Baseline:  Goal status: Goal Met 06/01/22     LONG TERM GOALS: Target date: 08/09/2022   Pt will be independent with advanced HEP. Baseline:  Goal status: ONGOING   2.  Pt will improve modified oswestry to 30% or less to demonstrate improvements with functional mobility. Baseline: 60% Goal status: Ongoing (see above)   3.  Pt will increase right hip strength to at least 4+/5 to improve ability to transfer her husband. Baseline:  Goal status: Ongoing (see above)   4.  Pt will report no increased pain when caring for her husband, including transferring her husband. Baseline:  Goal status: Ongoing  PLAN: PT FREQUENCY: 1-2x/week   PT DURATION: 8 weeks   PLANNED INTERVENTIONS:  Therapeutic exercises, Therapeutic activity, Neuromuscular re-education, Balance training, Gait training, Patient/Family education, Self Care, Joint mobilization, Joint manipulation, Stair training, Aquatic Therapy, Dry Needling, Electrical stimulation, Cryotherapy, Moist heat, Taping, Ultrasound, Ionotophoresis 20m/ml Dexamethasone, Manual therapy, and Re-evaluation.   PLAN FOR NEXT SESSION: assess and progress HEP as indicated, strengthening, flexibility, core stability      SJuel Burrow PT 07/09/22 11:20 AM  [address] [place] [address]Phone # [phone] [phone]

## 2022-07-10 NOTE — Telephone Encounter (Signed)
Attempted to call the patient, but unable to leave a message.

## 2022-07-12 ENCOUNTER — Encounter: Payer: Self-pay | Admitting: Rehabilitative and Restorative Service Providers"

## 2022-07-12 ENCOUNTER — Ambulatory Visit: Payer: Medicare HMO | Admitting: Rehabilitative and Restorative Service Providers"

## 2022-07-12 DIAGNOSIS — M6281 Muscle weakness (generalized): Secondary | ICD-10-CM

## 2022-07-12 DIAGNOSIS — R262 Difficulty in walking, not elsewhere classified: Secondary | ICD-10-CM

## 2022-07-12 DIAGNOSIS — R2689 Other abnormalities of gait and mobility: Secondary | ICD-10-CM

## 2022-07-12 DIAGNOSIS — R252 Cramp and spasm: Secondary | ICD-10-CM

## 2022-07-12 NOTE — Therapy (Signed)
OUTPATIENT PHYSICAL THERAPY TREATMENT NOTE   Patient Name: Rachel Nichols MRN: [id number] DOB:[birth year], 88 y.o., female Today's Date: 07/12/2022  PCP: Isaac Bliss, Rayford Halsted, MD REFERRING PROVIDER: Isaac Bliss, Rayford Halsted, MD    Progress Note Reporting Period 04/24/2022 to 07/05/2022  See note below for Objective Data and Assessment of Progress/Goals.         END OF SESSION:   PT End of Session - 07/12/22 1106     Visit Number 22    Date for PT Re-Evaluation 08/10/22    Authorization Type [employer]    Progress Note Due on Visit 30    PT Start Time 1100    PT Stop Time 1140    PT Time Calculation (min) 40 min    Activity Tolerance Patient tolerated treatment well;Patient limited by pain    Behavior During Therapy [facility] for tasks assessed/performed              Past Medical History:  Diagnosis Date   Adenomatous colon polyp 2002   Colonoscopy    Benign neoplasm of colon 2004   Colonoscopy   CAD (coronary artery disease)    echo 09/13/10- EF>55%, aortic valve is mildly sclerotic; myoview 02-20-12-no ischemia   Carotid bruit    carotid dopplers 05/16/04- normal   Diverticulosis 2002,2004,2007   Colonoscopy   Duodenitis    Fibrocystic breast disease    Gastritis    GERD (gastroesophageal reflux disease)    Heart attack ([place]) 1993   Cardiolyte stress---  stents 2012   Hiatal hernia    History of blood transfusion    pt denies   Hyperlipidemia    Peptic ulcer due to Helicobacter pylori    PUD (peptic ulcer disease)    SCC (squamous cell carcinoma) 04/24/2011   right jawline (CX35FU), left forearm (CX35FU)   Past Surgical History:  Procedure Laterality Date   [place]     From the nose   CATARACT EXTRACTION  10/26/09   Right eye   CATARACT EXTRACTION  11/09/09   Left eye   CHOLECYSTECTOMY  1984   CORONARY STENT PLACEMENT  01/10/11   hig grade ramus branch stenosis with mod LAD dz,  PCI/stenting of ramus branch with a Resolute DES    DILATION AND CURETTAGE OF UTERUS     FINGER SURGERY  04/2011   Cyst removal, right hand forefinger   LEFT OOPHORECTOMY  1957   SQUAMOUS CELL CARCINOMA EXCISION Right 07/24/2018   Posterior forearm shave & ED&C   Patient Active Problem List   Diagnosis Date Noted   IGT (impaired glucose tolerance) 01/12/2021   Hyperlipidemia LDL goal <70 01/13/2015   Murmur 01/13/2015   RUQ abdominal pain 01/22/2014   Pancreatic cyst 01/22/2014   Essential hypertension 03/06/2013   CAD S/P percutaneous coronary angioplasty 04/14/2012   GASTRITIS 10/19/2009   ABDOMINAL PAIN-EPIGASTRIC 10/18/2009   CHOLECYSTECTOMY, HX OF 10/18/2009   DIVERTICULOSIS, COLON 10/14/2009   COLONIC POLYPS, ADENOMATOUS, HX OF 10/14/2009   HIATAL HERNIA WITH REFLUX 09/19/2009   WEIGHT LOSS 09/19/2009   PUD, HX OF 09/19/2009   BACK PAIN 05/27/2008    REFERRING DIAG: M54.31 (ICD-10-CM) - Right sided sciatica  THERAPY DIAG:  Muscle weakness (generalized)  Other abnormalities of gait and mobility  Cramp and spasm  Difficulty in walking, not elsewhere classified  Rationale for Evaluation and Treatment Rehabilitation  PERTINENT HISTORY: None  PRECAUTIONS: none  SUBJECTIVE: Pt reports  that she is feeling better today, that she liked the leg press last session.  PAIN:  Are you having pain? Yes: NPRS scale: 4-5/10 Pain location: back of leg Pain description: radiating Aggravating factors: standing  Relieving factors: rest, meds   OBJECTIVE: (objective measures completed at initial evaluation unless otherwise dated)  OBJECTIVE:    DIAGNOSTIC FINDINGS:  N/A   PATIENT SURVEYS:  04/24/22:  Modified Oswestry 30 / 50 = 60.0 %           06/01/22:  Modified Oswestry Low Back Pain Disability Questionnaire: 28 / 50 = 56.0 %  06/12/2022:  Modified Oswestry Low Back Pain Disability Questionnaire: 26 / 50 = 52.0 % 07/05/2022:  Modified Oswestry Low Back Pain Disability  Questionnaire: 20 / 50 = 40.0 %            SENSATION: Reports numbness and tingling down right leg   MUSCLE LENGTH: Right hamstring tightness noted.   POSTURE: rounded shoulders and forward head   PALPATION: Muscle spasms noted in lumbar multifidi   LUMBAR ROM: Limited at least 50% secondary to pain     LOWER EXTREMITY MMT:   Eval:  Right hip 4-/5 grossly throughout 06/12/2022:  R hip 4/5 grossly throughout    LUMBAR SPECIAL TESTS:  Slump test: Positive   FUNCTIONAL TESTS:  04/24/2022:  5 times sit to stand: 16.2 sec pushing up from thighs with 10/10 pain  05/15/2022:   3 min walk test: 189 ft with SPC.  Pt had to sit down at 2.5 minutes and sat for the last 30 sec of the assessment  05/25/2022: 3 min walk test: 284 ft with [facility].   06/01/22: 5 times sit to/from stand:  13.9 sec with UE pushing up from thighs and reports of 7/10 pain TUG: 12.4 sec with [facility]  06/12/2022: 3 min walk test: 438 ft with [facility]  06/28/2022: 3 min walk test: 480 ft with SPC   GAIT: Distance walked: 75 ft Assistive device utilized: None Level of assistance: Complete Independence Comments: Antalgic gait pattern noted with decreased step length       TODAY'S TREATMENT: 07/12/2022: Nustep level 4 (seat at 8, blue machine), x8 min.  PT present to discuss status Leg Press, seat at 6, 50# 2x10 Standing hamstring stretch at steps 2x20 sec bilat FWD steps ups on 6" step 2x10 bilat with UE support Standing calf stretch with toes on foam 2x20 sec Standing on foam heel tap down 2x10 bilat Seated HS curls and clamshells with green tband 2x10 bilat each Seated with 2.5#:  LAQ and marching. 2x10 bilat each Sit to/from stand holding 5# kettlebell 3x5 stands   07/09/2022: Nustep level 4 (seat at 9, blue machine), x8 min.  PT present to discuss status Leg Press 50# 2x10 Seated piriformis stretch 2x20 sec bilat Seated table hamstring stretch 2x20 sec bilat Seated HS curls and clamshells with green tband 2x10  bilat each Seated holding yellow plyoball:  hip to hip, hip to shoulder.  2x10 each Sit to/from stand holding yellow plyoball 3x5 stands Seated with 2.5#:  LAQ and marching. 2x10 bilat each Seated on dynadisc:  4 way pelvic tilts  2x10 each   07/05/2022: Modified Oswestry Low Back Pain Disability Questionnaire: 20 / 50 = 40.0 % Seated on dynadisc:  4 way pelvic tilts  2x10 each Seated piriformis stretch 2x20 sec bilat Seated table hamstring stretch 2x20 sec bilat Seated HS curls and clamshells with green tband 2x10 bilat each 3 min walk test: 552  ft without AD Seated holding yellow plyoball:  hip to hip, hip to shoulder.  2x10 each Sit to/from stand holding yellow plyoball 2x5 stands Nustep level 5 (seat at 7, green machine), x6 min.  PT present to discuss status     PATIENT EDUCATION:  Education details: Issued HEP Person educated: Patient Education method: Explanation, Demonstration, and Handouts Education comprehension: verbalized understanding and returned demonstration     HOME EXERCISE PROGRAM: Access Code: [facility] URL: [email or web address] Date: 06/28/2022 Prepared by: Shelby Dubin Celinda Dethlefs  Exercises - Seated Hamstring Stretch with Chair  - 1 x daily - 7 x weekly - 1 sets - 3 reps - 30 sec hold - Seated Sciatic Tensioner  - 1 x daily - 7 x weekly - 1 sets - 10 reps - 5 sec hold - Seated Long Arc Quad  - 1 x daily - 7 x weekly - 2 sets - 10 reps - Sit to Stand Without Arm Support  - 1 x daily - 7 x weekly - 2 sets - 5 reps - Seated Hip External Rotation AROM  - 1 x daily - 7 x weekly - 2 sets - 10 reps - Supine Posterior Pelvic Tilt  - 1 x daily - 7 x weekly - 2 sets - 10 reps - Supine March  - 1 x daily - 7 x weekly - 2 sets - 10 reps - Supine Lower Trunk Rotation  - 1 x daily - 7 x weekly - 2 sets - 10 reps - Supine Dead Bug with Leg Extension  - 1 x daily - 7 x weekly - 2 sets - 10 reps    ASSESSMENT:   CLINICAL IMPRESSION: Ms Brierley presents to skilled  PT with continued reports of feeling better.  Pt able to progress with strengthening and dynamic/functional activities.  Pt with muscle fatigue during session, but did not report any increased pain.  Requires minimal cuing during exercises for technique.  Pt continues to require skilled PT to progress towards goal related activities.     OBJECTIVE IMPAIRMENTS difficulty walking, decreased strength, increased muscle spasms, and pain.    ACTIVITY LIMITATIONS lifting, standing, and locomotion level   PARTICIPATION LIMITATIONS: cleaning, driving, and shopping   PERSONAL FACTORS Age and 1-2 comorbidities: GERD, Squamous Cell Carcinoma  are also affecting patient's functional outcome.    REHAB POTENTIAL: Good   CLINICAL DECISION MAKING: Evolving/moderate complexity   EVALUATION COMPLEXITY: Moderate     GOALS: Goals reviewed with patient? Yes   SHORT TERM GOALS: Target date: 05/15/2022   Pt will be independent with initial HEP. Baseline: Goal status: Goal Met 05/23/22   2.  Pt will report at least a 40% improvement in symptoms. Baseline:  Goal status: Goal Met 06/01/22     LONG TERM GOALS: Target date: 08/09/2022   Pt will be independent with advanced HEP. Baseline:  Goal status: ONGOING   2.  Pt will improve modified oswestry to 30% or less to demonstrate improvements with functional mobility. Baseline: 60% Goal status: Ongoing (see above)   3.  Pt will increase right hip strength to at least 4+/5 to improve ability to transfer her husband. Baseline:  Goal status: Ongoing (see above)   4.  Pt will report no increased pain when caring for her husband, including transferring her husband. Baseline:  Goal status: Ongoing      PLAN: PT FREQUENCY: 1-2x/week   PT DURATION: 8 weeks   PLANNED INTERVENTIONS: Therapeutic exercises, Therapeutic activity, Neuromuscular re-education, Balance training, Gait training,  Patient/Family education, Self Care, Joint mobilization, Joint  manipulation, Stair training, Aquatic Therapy, Dry Needling, Electrical stimulation, Cryotherapy, Moist heat, Taping, Ultrasound, Ionotophoresis 91m/ml Dexamethasone, Manual therapy, and Re-evaluation.   PLAN FOR NEXT SESSION: assess and progress HEP as indicated, strengthening, flexibility, core stability      SJuel Burrow PT 07/12/22 11:42 AM  [facility] [address] [place] [address]Phone # [phone] [phone]

## 2022-07-16 ENCOUNTER — Ambulatory Visit: Payer: Medicare HMO | Admitting: Rehabilitative and Restorative Service Providers"

## 2022-07-16 ENCOUNTER — Encounter: Payer: Self-pay | Admitting: Rehabilitative and Restorative Service Providers"

## 2022-07-16 DIAGNOSIS — R262 Difficulty in walking, not elsewhere classified: Secondary | ICD-10-CM | POA: Diagnosis not present

## 2022-07-16 DIAGNOSIS — R252 Cramp and spasm: Secondary | ICD-10-CM

## 2022-07-16 DIAGNOSIS — M6281 Muscle weakness (generalized): Secondary | ICD-10-CM | POA: Diagnosis not present

## 2022-07-16 DIAGNOSIS — R2689 Other abnormalities of gait and mobility: Secondary | ICD-10-CM

## 2022-07-16 NOTE — Therapy (Signed)
OUTPATIENT PHYSICAL THERAPY TREATMENT NOTE   Patient Name: Rachel Nichols MRN: [id number] DOB:July 09, 1931, 88 y.o., female Today's Date: 07/16/2022  PCP: Isaac Bliss, Rayford Halsted, MD REFERRING PROVIDER: Isaac Bliss, Rayford Halsted, MD    Progress Note Reporting Period 04/24/2022 to 07/05/2022  See note below for Objective Data and Assessment of Progress/Goals.         END OF SESSION:   PT End of Session - 07/16/22 1103     Visit Number 23    Date for PT Re-Evaluation 08/10/22    Authorization Type [employer] Medicare    Progress Note Due on Visit 30    PT Start Time 1100    PT Stop Time 1139    PT Time Calculation (min) 39 min    Activity Tolerance Patient tolerated treatment well    Behavior During Therapy WFL for tasks assessed/performed              Past Medical History:  Diagnosis Date   Adenomatous colon polyp 2002   Colonoscopy    Benign neoplasm of colon 2004   Colonoscopy   CAD (coronary artery disease)    echo 09/13/10- EF>55%, aortic valve is mildly sclerotic; myoview 02-20-12-no ischemia   Carotid bruit    carotid dopplers 05/16/04- normal   Diverticulosis 2002,2004,2007   Colonoscopy   Duodenitis    Fibrocystic breast disease    Gastritis    GERD (gastroesophageal reflux disease)    Heart attack ([place]) 1993   Cardiolyte stress---  stents 2012   Hiatal hernia    History of blood transfusion    pt denies   Hyperlipidemia    Peptic ulcer due to Helicobacter pylori    PUD (peptic ulcer disease)    SCC (squamous cell carcinoma) 04/24/2011   right jawline (CX35FU), left forearm (CX35FU)   Past Surgical History:  Procedure Laterality Date   [place]     From the nose   CATARACT EXTRACTION  10/26/09   Right eye   CATARACT EXTRACTION  11/09/09   Left eye   CHOLECYSTECTOMY  1984   CORONARY STENT PLACEMENT  01/10/11   hig grade ramus branch stenosis with mod LAD dz, PCI/stenting of ramus branch  with a Resolute DES    DILATION AND CURETTAGE OF UTERUS     FINGER SURGERY  04/2011   Cyst removal, right hand forefinger   LEFT OOPHORECTOMY  1957   SQUAMOUS CELL CARCINOMA EXCISION Right 07/24/2018   Posterior forearm shave & ED&C   Patient Active Problem List   Diagnosis Date Noted   IGT (impaired glucose tolerance) 01/12/2021   Hyperlipidemia LDL goal <70 01/13/2015   Murmur 01/13/2015   RUQ abdominal pain 01/22/2014   Pancreatic cyst 01/22/2014   Essential hypertension 03/06/2013   CAD S/P percutaneous coronary angioplasty 04/14/2012   GASTRITIS 10/19/2009   ABDOMINAL PAIN-EPIGASTRIC 10/18/2009   CHOLECYSTECTOMY, HX OF 10/18/2009   DIVERTICULOSIS, COLON 10/14/2009   COLONIC POLYPS, ADENOMATOUS, HX OF 10/14/2009   HIATAL HERNIA WITH REFLUX 09/19/2009   WEIGHT LOSS 09/19/2009   PUD, HX OF 09/19/2009   BACK PAIN 05/27/2008    REFERRING DIAG: M54.31 (ICD-10-CM) - Right sided sciatica  THERAPY DIAG:  Muscle weakness (generalized)  Other abnormalities of gait and mobility  Cramp and spasm  Difficulty in walking, not elsewhere classified  Rationale for Evaluation and Treatment Rehabilitation  PERTINENT HISTORY: None  PRECAUTIONS: none  SUBJECTIVE: Pt reports that she is  feeling good today and that she continues to have improvements.  PAIN:  Are you having pain? Yes: NPRS scale: 4-6/10 Pain location: back of leg Pain description: radiating Aggravating factors: standing  Relieving factors: rest, meds   OBJECTIVE: (objective measures completed at initial evaluation unless otherwise dated)  OBJECTIVE:    DIAGNOSTIC FINDINGS:  N/A   PATIENT SURVEYS:  04/24/22:  Modified Oswestry 30 / 50 = 60.0 %           06/01/22:  Modified Oswestry Low Back Pain Disability Questionnaire: 28 / 50 = 56.0 %  06/12/2022:  Modified Oswestry Low Back Pain Disability Questionnaire: 26 / 50 = 52.0 % 07/05/2022:  Modified Oswestry Low Back Pain Disability Questionnaire: 20 / 50 = 40.0  %            SENSATION: Reports numbness and tingling down right leg   MUSCLE LENGTH: Right hamstring tightness noted.   POSTURE: rounded shoulders and forward head   PALPATION: Muscle spasms noted in lumbar multifidi   LUMBAR ROM: Limited at least 50% secondary to pain     LOWER EXTREMITY MMT:   Eval:  Right hip 4-/5 grossly throughout 06/12/2022:  R hip 4/5 grossly throughout    LUMBAR SPECIAL TESTS:  Slump test: Positive   FUNCTIONAL TESTS:  04/24/2022:  5 times sit to stand: 16.2 sec pushing up from thighs with 10/10 pain  05/15/2022:   3 min walk test: 189 ft with SPC.  Pt had to sit down at 2.5 minutes and sat for the last 30 sec of the assessment  05/25/2022: 3 min walk test: 284 ft with [facility].   06/01/22: 5 times sit to/from stand:  13.9 sec with UE pushing up from thighs and reports of 7/10 pain TUG: 12.4 sec with [facility]  06/12/2022: 3 min walk test: 438 ft with [facility]  06/28/2022: 3 min walk test: 480 ft with SPC   GAIT: Distance walked: 75 ft Assistive device utilized: None Level of assistance: Complete Independence Comments: Antalgic gait pattern noted with decreased step length       TODAY'S TREATMENT: 07/16/2022: Seated with 3#:  heel/toe raises, LAQ, marching, and hip abduction scissors. 2x10 bilat each Sit to/from stand holding 5# kettlebell 3x5 stands Nustep level 4 (seat at 9, blue machine), x8 min.  PT present to discuss status Leg Press, seat at 6, 50# 2x10 Seated hamstring curls with green tband 2x10 bilat Seated with 6# on thigh with foot up and over small hurdle and back 2x10 bilat Standing calf stretch with toes on 2" step 2x20 sec Standing on 2" step heel tap down 2x10 bilat Rocker board 2 min High marching on trampoline x1 min Lateral weight shift on trampoline x1 min   07/12/2022: Nustep level 4 (seat at 8, blue machine), x8 min.  PT present to discuss status Leg Press, seat at 6, 50# 2x10 Standing hamstring stretch at steps 2x20 sec  bilat FWD steps ups on 6" step 2x10 bilat with UE support Standing calf stretch with toes on foam 2x20 sec Standing on foam heel tap down 2x10 bilat Seated HS curls and clamshells with green tband 2x10 bilat each Seated with 2.5#:  LAQ and marching. 2x10 bilat each Sit to/from stand holding 5# kettlebell 3x5 stands   07/09/2022: Nustep level 4 (seat at 9, blue machine), x8 min.  PT present to discuss status Leg Press 50# 2x10 Seated piriformis stretch 2x20 sec bilat Seated table hamstring stretch 2x20 sec bilat Seated HS curls and clamshells  with green tband 2x10 bilat each Seated holding yellow plyoball:  hip to hip, hip to shoulder.  2x10 each Sit to/from stand holding yellow plyoball 3x5 stands Seated with 2.5#:  LAQ and marching. 2x10 bilat each Seated on dynadisc:  4 way pelvic tilts  2x10 each     PATIENT EDUCATION:  Education details: Issued HEP Person educated: Patient Education method: Explanation, Demonstration, and Handouts Education comprehension: verbalized understanding and returned demonstration     HOME EXERCISE PROGRAM: Access Code: [facility] URL: [email or web address] Date: 06/28/2022 Prepared by: Shelby Dubin Tenishia Ekman  Exercises - Seated Hamstring Stretch with Chair  - 1 x daily - 7 x weekly - 1 sets - 3 reps - 30 sec hold - Seated Sciatic Tensioner  - 1 x daily - 7 x weekly - 1 sets - 10 reps - 5 sec hold - Seated Long Arc Quad  - 1 x daily - 7 x weekly - 2 sets - 10 reps - Sit to Stand Without Arm Support  - 1 x daily - 7 x weekly - 2 sets - 5 reps - Seated Hip External Rotation AROM  - 1 x daily - 7 x weekly - 2 sets - 10 reps - Supine Posterior Pelvic Tilt  - 1 x daily - 7 x weekly - 2 sets - 10 reps - Supine March  - 1 x daily - 7 x weekly - 2 sets - 10 reps - Supine Lower Trunk Rotation  - 1 x daily - 7 x weekly - 2 sets - 10 reps - Supine Dead Bug with Leg Extension  - 1 x daily - 7 x weekly - 2 sets - 10 reps    ASSESSMENT:   CLINICAL  IMPRESSION: Ms Needles presents to skilled PT with continued reports of feeling better reporting that she had some time yesterday where she barely had any pain.  Pt able to progress with standing exercises and continued strengthening.  Pt reports fatigue during session, but did not report increased pain.  Pt continues to require skilled PT to progress towards goal related activities.     OBJECTIVE IMPAIRMENTS difficulty walking, decreased strength, increased muscle spasms, and pain.    ACTIVITY LIMITATIONS lifting, standing, and locomotion level   PARTICIPATION LIMITATIONS: cleaning, driving, and shopping   PERSONAL FACTORS Age and 1-2 comorbidities: GERD, Squamous Cell Carcinoma  are also affecting patient's functional outcome.    REHAB POTENTIAL: Good   CLINICAL DECISION MAKING: Evolving/moderate complexity   EVALUATION COMPLEXITY: Moderate     GOALS: Goals reviewed with patient? Yes   SHORT TERM GOALS: Target date: 05/15/2022   Pt will be independent with initial HEP. Baseline: Goal status: Goal Met 05/23/22   2.  Pt will report at least a 40% improvement in symptoms. Baseline:  Goal status: Goal Met 06/01/22     LONG TERM GOALS: Target date: 08/09/2022   Pt will be independent with advanced HEP. Baseline:  Goal status: ONGOING   2.  Pt will improve modified oswestry to 30% or less to demonstrate improvements with functional mobility. Baseline: 60% Goal status: Ongoing (see above)   3.  Pt will increase right hip strength to at least 4+/5 to improve ability to transfer her husband. Baseline:  Goal status: Ongoing (see above)   4.  Pt will report no increased pain when caring for her husband, including transferring her husband. Baseline:  Goal status: Ongoing      PLAN: PT FREQUENCY: 1-2x/week   PT DURATION: 8 weeks  PLANNED INTERVENTIONS: Therapeutic exercises, Therapeutic activity, Neuromuscular re-education, Balance training, Gait training, Patient/Family  education, Self Care, Joint mobilization, Joint manipulation, Stair training, Aquatic Therapy, Dry Needling, Electrical stimulation, Cryotherapy, Moist heat, Taping, Ultrasound, Ionotophoresis 63m/ml Dexamethasone, Manual therapy, and Re-evaluation.   PLAN FOR NEXT SESSION: assess and progress HEP as indicated, strengthening, flexibility, core stability      SJuel Burrow PT 07/16/22 12:00 PM  [address] [place] [address]Phone # [phone] [phone]

## 2022-07-19 ENCOUNTER — Ambulatory Visit: Payer: Medicare HMO | Admitting: Rehabilitative and Restorative Service Providers"

## 2022-07-19 ENCOUNTER — Encounter: Payer: Self-pay | Admitting: Rehabilitative and Restorative Service Providers"

## 2022-07-19 DIAGNOSIS — M6281 Muscle weakness (generalized): Secondary | ICD-10-CM | POA: Diagnosis not present

## 2022-07-19 DIAGNOSIS — R252 Cramp and spasm: Secondary | ICD-10-CM | POA: Diagnosis not present

## 2022-07-19 DIAGNOSIS — R262 Difficulty in walking, not elsewhere classified: Secondary | ICD-10-CM

## 2022-07-19 DIAGNOSIS — R2689 Other abnormalities of gait and mobility: Secondary | ICD-10-CM

## 2022-07-19 NOTE — Therapy (Signed)
OUTPATIENT PHYSICAL THERAPY TREATMENT NOTE   Patient Name: Rachel Nichols MRN: [id number] DOB:27-Jan-1931, 88 y.o., female Today's Date: 07/19/2022  PCP: Isaac Bliss, Rayford Halsted, MD REFERRING PROVIDER: Isaac Bliss, Rayford Halsted, MD     END OF SESSION:   PT End of Session - 07/19/22 1105     Visit Number 24    Date for PT Re-Evaluation 08/10/22    Authorization Type [employer]    Progress Note Due on Visit 30    PT Start Time 1102    PT Stop Time 1140    PT Time Calculation (min) 38 min    Activity Tolerance Patient tolerated treatment well    Behavior During Therapy [facility] for tasks assessed/performed              Past Medical History:  Diagnosis Date   Adenomatous colon polyp 2002   Colonoscopy    Benign neoplasm of colon 2004   Colonoscopy   CAD (coronary artery disease)    echo 09/13/10- EF>55%, aortic valve is mildly sclerotic; myoview 02-20-12-no ischemia   Carotid bruit    carotid dopplers 05/16/04- normal   Diverticulosis 2002,2004,2007   Colonoscopy   Duodenitis    Fibrocystic breast disease    Gastritis    GERD (gastroesophageal reflux disease)    Heart attack ([place]) 1993   Cardiolyte stress---  stents 2012   Hiatal hernia    History of blood transfusion    pt denies   Hyperlipidemia    Peptic ulcer due to Helicobacter pylori    PUD (peptic ulcer disease)    SCC (squamous cell carcinoma) 04/24/2011   right jawline (CX35FU), left forearm (CX35FU)   Past Surgical History:  Procedure Laterality Date   [place]     From the nose   CATARACT EXTRACTION  10/26/09   Right eye   CATARACT EXTRACTION  11/09/09   Left eye   CHOLECYSTECTOMY  1984   CORONARY STENT PLACEMENT  01/10/11   hig grade ramus branch stenosis with mod LAD dz, PCI/stenting of ramus branch with a Resolute DES    DILATION AND CURETTAGE OF UTERUS     FINGER SURGERY  04/2011   Cyst removal, right hand forefinger   LEFT  OOPHORECTOMY  1957   SQUAMOUS CELL CARCINOMA EXCISION Right 07/24/2018   Posterior forearm shave & ED&C   Patient Active Problem List   Diagnosis Date Noted   IGT (impaired glucose tolerance) 01/12/2021   Hyperlipidemia LDL goal <70 01/13/2015   Murmur 01/13/2015   RUQ abdominal pain 01/22/2014   Pancreatic cyst 01/22/2014   Essential hypertension 03/06/2013   CAD S/P percutaneous coronary angioplasty 04/14/2012   GASTRITIS 10/19/2009   ABDOMINAL PAIN-EPIGASTRIC 10/18/2009   CHOLECYSTECTOMY, HX OF 10/18/2009   DIVERTICULOSIS, COLON 10/14/2009   COLONIC POLYPS, ADENOMATOUS, HX OF 10/14/2009   HIATAL HERNIA WITH REFLUX 09/19/2009   WEIGHT LOSS 09/19/2009   PUD, HX OF 09/19/2009   BACK PAIN 05/27/2008    REFERRING DIAG: M54.31 (ICD-10-CM) - Right sided sciatica  THERAPY DIAG:  Muscle weakness (generalized)  Other abnormalities of gait and mobility  Cramp and spasm  Difficulty in walking, not elsewhere classified  Rationale for Evaluation and Treatment Rehabilitation  PERTINENT HISTORY: None  PRECAUTIONS: none  SUBJECTIVE: Pt reports that she continues to make progress, but feels that she is not yet ready to continue independently.  PAIN:  Are you having pain? Yes: NPRS scale: 4-6/10  Pain location: back of leg Pain description: radiating Aggravating factors: standing  Relieving factors: rest, meds   OBJECTIVE: (objective measures completed at initial evaluation unless otherwise dated)  OBJECTIVE:    DIAGNOSTIC FINDINGS:  N/A   PATIENT SURVEYS:  04/24/22:  Modified Oswestry 30 / 50 = 60.0 %           06/01/22:  Modified Oswestry Low Back Pain Disability Questionnaire: 28 / 50 = 56.0 %  06/12/2022:  Modified Oswestry Low Back Pain Disability Questionnaire: 26 / 50 = 52.0 % 07/05/2022:  Modified Oswestry Low Back Pain Disability Questionnaire: 20 / 50 = 40.0 %            SENSATION: Reports numbness and tingling down right leg   MUSCLE LENGTH: Right hamstring  tightness noted.   POSTURE: rounded shoulders and forward head   PALPATION: Muscle spasms noted in lumbar multifidi   LUMBAR ROM: Limited at least 50% secondary to pain     LOWER EXTREMITY MMT:   Eval:  Right hip 4-/5 grossly throughout 06/12/2022:  R hip 4/5 grossly throughout    LUMBAR SPECIAL TESTS:  Slump test: Positive   FUNCTIONAL TESTS:  04/24/2022:  5 times sit to stand: 16.2 sec pushing up from thighs with 10/10 pain  05/15/2022:   3 min walk test: 189 ft with SPC.  Pt had to sit down at 2.5 minutes and sat for the last 30 sec of the assessment  05/25/2022: 3 min walk test: 284 ft with [facility].   06/01/22: 5 times sit to/from stand:  13.9 sec with UE pushing up from thighs and reports of 7/10 pain TUG: 12.4 sec with [facility]  06/12/2022: 3 min walk test: 438 ft with [facility]  06/28/2022: 3 min walk test: 480 ft with SPC   GAIT: Distance walked: 75 ft Assistive device utilized: None Level of assistance: Complete Independence Comments: Antalgic gait pattern noted with decreased step length       TODAY'S TREATMENT: 07/19/2022: Seated piriformis stretch 2x20 sec each bilat Seated hamstring stretch on table 2x20 sec each bilat Seated with 3#:  heel/toe raises, LAQ, marching, and hip abduction scissors. 2x10 bilat each Nustep level 4 (seat at 9, blue machine), x8 min.  PT present to discuss status Seated with 6# on thigh with foot up and over small hurdle and back 2x10 bilat Seated hamstring curls with green tband 2x10 bilat High marching on trampoline x1 min Standing on foam heel tap down 2x10 bilat Standing calf stretch with toes on slanted rocker board 2x20 sec   07/16/2022: Seated with 3#:  heel/toe raises, LAQ, marching, and hip abduction scissors. 2x10 bilat each Sit to/from stand holding 5# kettlebell 3x5 stands Nustep level 4 (seat at 9, blue machine), x8 min.  PT present to discuss status Leg Press, seat at 6, 50# 2x10 Seated hamstring curls with green tband 2x10  bilat Seated with 6# on thigh with foot up and over small hurdle and back 2x10 bilat Standing calf stretch with toes on 2" step 2x20 sec Standing on 2" step heel tap down 2x10 bilat Rocker board 2 min High marching on trampoline x1 min Lateral weight shift on trampoline x1 min   07/12/2022: Nustep level 4 (seat at 8, blue machine), x8 min.  PT present to discuss status Leg Press, seat at 6, 50# 2x10 Standing hamstring stretch at steps 2x20 sec bilat FWD steps ups on 6" step 2x10 bilat with UE support Standing calf stretch with toes on foam 2x20 sec  Standing on foam heel tap down 2x10 bilat Seated HS curls and clamshells with green tband 2x10 bilat each Seated with 2.5#:  LAQ and marching. 2x10 bilat each Sit to/from stand holding 5# kettlebell 3x5 stands     PATIENT EDUCATION:  Education details: Issued HEP Person educated: Patient Education method: Explanation, Demonstration, and Handouts Education comprehension: verbalized understanding and returned demonstration     HOME EXERCISE PROGRAM: Access Code: [facility] URL: [email or web address] Date: 06/28/2022 Prepared by: Shelby Dubin Armelia Penton  Exercises - Seated Hamstring Stretch with Chair  - 1 x daily - 7 x weekly - 1 sets - 3 reps - 30 sec hold - Seated Sciatic Tensioner  - 1 x daily - 7 x weekly - 1 sets - 10 reps - 5 sec hold - Seated Long Arc Quad  - 1 x daily - 7 x weekly - 2 sets - 10 reps - Sit to Stand Without Arm Support  - 1 x daily - 7 x weekly - 2 sets - 5 reps - Seated Hip External Rotation AROM  - 1 x daily - 7 x weekly - 2 sets - 10 reps - Supine Posterior Pelvic Tilt  - 1 x daily - 7 x weekly - 2 sets - 10 reps - Supine March  - 1 x daily - 7 x weekly - 2 sets - 10 reps - Supine Lower Trunk Rotation  - 1 x daily - 7 x weekly - 2 sets - 10 reps - Supine Dead Bug with Leg Extension  - 1 x daily - 7 x weekly - 2 sets - 10 reps    ASSESSMENT:   CLINICAL IMPRESSION: Ms Brownley presents to skilled PT  with continued reports of improvement.  Pt with muscle fatigue noted at completion of visit today, but able to complete all activities.  Added dorsiflexion during hamstring stretch to encourage sciatic nerve flossing.  Pt continues to require skilled PT to progress towards goal related activities.     OBJECTIVE IMPAIRMENTS difficulty walking, decreased strength, increased muscle spasms, and pain.    ACTIVITY LIMITATIONS lifting, standing, and locomotion level   PARTICIPATION LIMITATIONS: cleaning, driving, and shopping   PERSONAL FACTORS Age and 1-2 comorbidities: GERD, Squamous Cell Carcinoma  are also affecting patient's functional outcome.    REHAB POTENTIAL: Good   CLINICAL DECISION MAKING: Evolving/moderate complexity   EVALUATION COMPLEXITY: Moderate     GOALS: Goals reviewed with patient? Yes   SHORT TERM GOALS: Target date: 05/15/2022   Pt will be independent with initial HEP. Baseline: Goal status: Goal Met 05/23/22   2.  Pt will report at least a 40% improvement in symptoms. Baseline:  Goal status: Goal Met 06/01/22     LONG TERM GOALS: Target date: 08/09/2022   Pt will be independent with advanced HEP. Baseline:  Goal status: ONGOING   2.  Pt will improve modified oswestry to 30% or less to demonstrate improvements with functional mobility. Baseline: 60% Goal status: Ongoing (see above)   3.  Pt will increase right hip strength to at least 4+/5 to improve ability to transfer her husband. Baseline:  Goal status: Ongoing (see above)   4.  Pt will report no increased pain when caring for her husband, including transferring her husband. Baseline:  Goal status: Ongoing      PLAN: PT FREQUENCY: 1-2x/week   PT DURATION: 8 weeks   PLANNED INTERVENTIONS: Therapeutic exercises, Therapeutic activity, Neuromuscular re-education, Balance training, Gait training, Patient/Family education, Self Care, Joint mobilization, Joint manipulation, Stair  training, Aquatic  Therapy, Dry Needling, Electrical stimulation, Cryotherapy, Moist heat, Taping, Ultrasound, Ionotophoresis 50m/ml Dexamethasone, Manual therapy, and Re-evaluation.   PLAN FOR NEXT SESSION: assess and progress HEP as indicated, strengthening, flexibility, core stability      SJuel Burrow PT 07/19/22 11:45 AM  [place] [place] [address]Phone # [phone] [phone]

## 2022-07-23 ENCOUNTER — Encounter: Payer: Self-pay | Admitting: Rehabilitative and Restorative Service Providers"

## 2022-07-23 ENCOUNTER — Ambulatory Visit: Payer: Medicare HMO | Admitting: Rehabilitative and Restorative Service Providers"

## 2022-07-23 DIAGNOSIS — M6281 Muscle weakness (generalized): Secondary | ICD-10-CM | POA: Diagnosis not present

## 2022-07-23 DIAGNOSIS — R252 Cramp and spasm: Secondary | ICD-10-CM

## 2022-07-23 DIAGNOSIS — R262 Difficulty in walking, not elsewhere classified: Secondary | ICD-10-CM

## 2022-07-23 DIAGNOSIS — R2689 Other abnormalities of gait and mobility: Secondary | ICD-10-CM | POA: Diagnosis not present

## 2022-07-23 NOTE — Therapy (Signed)
OUTPATIENT PHYSICAL THERAPY TREATMENT NOTE   Patient Name: Rachel Nichols MRN: [id number] DOB:Jan 20, 1931, 88 y.o., female Today's Date: 07/23/2022  PCP: Isaac Bliss, Rayford Halsted, MD REFERRING PROVIDER: Isaac Bliss, Rayford Halsted, MD     END OF SESSION:   PT End of Session - 07/23/22 1026     Visit Number 25    Date for PT Re-Evaluation 08/10/22    Authorization Type [employer] Medicare    Progress Note Due on Visit 30    PT Start Time 1015    PT Stop Time 1054    PT Time Calculation (min) 39 min    Activity Tolerance Patient tolerated treatment well    Behavior During Therapy [facility] for tasks assessed/performed              Past Medical History:  Diagnosis Date   Adenomatous colon polyp 2002   Colonoscopy    Benign neoplasm of colon 2004   Colonoscopy   CAD (coronary artery disease)    echo 09/13/10- EF>55%, aortic valve is mildly sclerotic; myoview 02-20-12-no ischemia   Carotid bruit    carotid dopplers 05/16/04- normal   Diverticulosis 2002,2004,2007   Colonoscopy   Duodenitis    Fibrocystic breast disease    Gastritis    GERD (gastroesophageal reflux disease)    Heart attack ([place]) 1993   Cardiolyte stress---  stents 2012   Hiatal hernia    History of blood transfusion    pt denies   Hyperlipidemia    Peptic ulcer due to Helicobacter pylori    PUD (peptic ulcer disease)    SCC (squamous cell carcinoma) 04/24/2011   right jawline (CX35FU), left forearm (CX35FU)   Past Surgical History:  Procedure Laterality Date   [place]     From the nose   CATARACT EXTRACTION  10/26/09   Right eye   CATARACT EXTRACTION  11/09/09   Left eye   CHOLECYSTECTOMY  1984   CORONARY STENT PLACEMENT  01/10/11   hig grade ramus branch stenosis with mod LAD dz, PCI/stenting of ramus branch with a Resolute DES    DILATION AND CURETTAGE OF UTERUS     FINGER SURGERY  04/2011   Cyst removal, right hand forefinger   LEFT  OOPHORECTOMY  1957   SQUAMOUS CELL CARCINOMA EXCISION Right 07/24/2018   Posterior forearm shave & ED&C   Patient Active Problem List   Diagnosis Date Noted   IGT (impaired glucose tolerance) 01/12/2021   Hyperlipidemia LDL goal <70 01/13/2015   Murmur 01/13/2015   RUQ abdominal pain 01/22/2014   Pancreatic cyst 01/22/2014   Essential hypertension 03/06/2013   CAD S/P percutaneous coronary angioplasty 04/14/2012   GASTRITIS 10/19/2009   ABDOMINAL PAIN-EPIGASTRIC 10/18/2009   CHOLECYSTECTOMY, HX OF 10/18/2009   DIVERTICULOSIS, COLON 10/14/2009   COLONIC POLYPS, ADENOMATOUS, HX OF 10/14/2009   HIATAL HERNIA WITH REFLUX 09/19/2009   WEIGHT LOSS 09/19/2009   PUD, HX OF 09/19/2009   BACK PAIN 05/27/2008    REFERRING DIAG: M54.31 (ICD-10-CM) - Right sided sciatica  THERAPY DIAG:  Muscle weakness (generalized)  Other abnormalities of gait and mobility  Cramp and spasm  Difficulty in walking, not elsewhere classified  Rationale for Evaluation and Treatment Rehabilitation  PERTINENT HISTORY: None  PRECAUTIONS: none  SUBJECTIVE: Pt reports continued progress with decreased pain and improved walking.  PAIN:  Are you having pain? Yes: NPRS scale: 4-5/10 Pain location: back of leg Pain description: radiating Aggravating  factors: standing  Relieving factors: rest, meds   OBJECTIVE: (objective measures completed at initial evaluation unless otherwise dated)  OBJECTIVE:    DIAGNOSTIC FINDINGS:  N/A   PATIENT SURVEYS:  04/24/22:  Modified Oswestry 30 / 50 = 60.0 %           06/01/22:  Modified Oswestry Low Back Pain Disability Questionnaire: 28 / 50 = 56.0 %  06/12/2022:  Modified Oswestry Low Back Pain Disability Questionnaire: 26 / 50 = 52.0 % 07/05/2022:  Modified Oswestry Low Back Pain Disability Questionnaire: 20 / 50 = 40.0 %            SENSATION: Reports numbness and tingling down right leg   MUSCLE LENGTH: Right hamstring tightness noted.   POSTURE: rounded  shoulders and forward head   PALPATION: Muscle spasms noted in lumbar multifidi   LUMBAR ROM: Limited at least 50% secondary to pain     LOWER EXTREMITY MMT:   Eval:  Right hip 4-/5 grossly throughout 06/12/2022:  R hip 4/5 grossly throughout    LUMBAR SPECIAL TESTS:  Slump test: Positive   FUNCTIONAL TESTS:  04/24/2022:  5 times sit to stand: 16.2 sec pushing up from thighs with 10/10 pain  05/15/2022:   3 min walk test: 189 ft with SPC.  Pt had to sit down at 2.5 minutes and sat for the last 30 sec of the assessment  05/25/2022: 3 min walk test: 284 ft with [facility].   06/01/22: 5 times sit to/from stand:  13.9 sec with UE pushing up from thighs and reports of 7/10 pain TUG: 12.4 sec with [facility]  06/12/2022: 3 min walk test: 438 ft with [facility]  06/28/2022: 3 min walk test: 480 ft with SPC   GAIT: Distance walked: 75 ft Assistive device utilized: None Level of assistance: Complete Independence Comments: Antalgic gait pattern noted with decreased step length       TODAY'S TREATMENT: 07/23/2022: Nustep level 4 (seat at 9, blue machine), x8 min.  PT present to discuss status Seated hamstring stretch on table 2x20 sec each bilat Seated piriformis stretch 2x20 sec each bilat Seated with 3#:  heel/toe raises, LAQ, marching, and hip abduction scissors. 2x10 bilat each Seated hamstring curls with green tband 2x10 bilat Seated with 6# on thigh with foot up and over small hurdle and back 2x10 bilat Leg Press, seat at 6, 50# 2x10 Standing calf stretch with toes on 2" step 2x20 sec Standing on foam heel tap down 2x10 bilat High marching on trampoline x1 min   07/19/2022: Seated piriformis stretch 2x20 sec each bilat Seated hamstring stretch on table 2x20 sec each bilat Seated with 3#:  heel/toe raises, LAQ, marching, and hip abduction scissors. 2x10 bilat each Nustep level 4 (seat at 9, blue machine), x8 min.  PT present to discuss status Seated with 6# on thigh with foot up and over  small hurdle and back 2x10 bilat Seated hamstring curls with green tband 2x10 bilat High marching on trampoline x1 min Standing on foam heel tap down 2x10 bilat Standing calf stretch with toes on slanted rocker board 2x20 sec   07/16/2022: Seated with 3#:  heel/toe raises, LAQ, marching, and hip abduction scissors. 2x10 bilat each Sit to/from stand holding 5# kettlebell 3x5 stands Nustep level 4 (seat at 9, blue machine), x8 min.  PT present to discuss status Leg Press, seat at 6, 50# 2x10 Seated hamstring curls with green tband 2x10 bilat Seated with 6# on thigh with foot up and over  small hurdle and back 2x10 bilat Standing calf stretch with toes on 2" step 2x20 sec Standing on 2" step heel tap down 2x10 bilat Rocker board 2 min High marching on trampoline x1 min Lateral weight shift on trampoline x1 min     PATIENT EDUCATION:  Education details: Issued HEP Person educated: Patient Education method: Consulting civil engineer, Media planner, and Handouts Education comprehension: verbalized understanding and returned demonstration     HOME EXERCISE PROGRAM: Access Code: [facility] URL: [email or web address] Date: 06/28/2022 Prepared by: Shelby Dubin Crissa Sowder  Exercises - Seated Hamstring Stretch with Chair  - 1 x daily - 7 x weekly - 1 sets - 3 reps - 30 sec hold - Seated Sciatic Tensioner  - 1 x daily - 7 x weekly - 1 sets - 10 reps - 5 sec hold - Seated Long Arc Quad  - 1 x daily - 7 x weekly - 2 sets - 10 reps - Sit to Stand Without Arm Support  - 1 x daily - 7 x weekly - 2 sets - 5 reps - Seated Hip External Rotation AROM  - 1 x daily - 7 x weekly - 2 sets - 10 reps - Supine Posterior Pelvic Tilt  - 1 x daily - 7 x weekly - 2 sets - 10 reps - Supine March  - 1 x daily - 7 x weekly - 2 sets - 10 reps - Supine Lower Trunk Rotation  - 1 x daily - 7 x weekly - 2 sets - 10 reps - Supine Dead Bug with Leg Extension  - 1 x daily - 7 x weekly - 2 sets - 10 reps    ASSESSMENT:    CLINICAL IMPRESSION: Ms Geisel presents to skilled PT with continued reports of improvement and able to walk into clinic without using her cane.  Pt continues to have some difficulty with exercises, so have not progressed weights yet.  Pt continues to progress and require continued skilled PT.     OBJECTIVE IMPAIRMENTS difficulty walking, decreased strength, increased muscle spasms, and pain.    ACTIVITY LIMITATIONS lifting, standing, and locomotion level   PARTICIPATION LIMITATIONS: cleaning, driving, and shopping   PERSONAL FACTORS Age and 1-2 comorbidities: GERD, Squamous Cell Carcinoma  are also affecting patient's functional outcome.    REHAB POTENTIAL: Good   CLINICAL DECISION MAKING: Evolving/moderate complexity   EVALUATION COMPLEXITY: Moderate     GOALS: Goals reviewed with patient? Yes   SHORT TERM GOALS: Target date: 05/15/2022   Pt will be independent with initial HEP. Baseline: Goal status: Goal Met 05/23/22   2.  Pt will report at least a 40% improvement in symptoms. Baseline:  Goal status: Goal Met 06/01/22     LONG TERM GOALS: Target date: 08/09/2022   Pt will be independent with advanced HEP. Baseline:  Goal status: ONGOING   2.  Pt will improve modified oswestry to 30% or less to demonstrate improvements with functional mobility. Baseline: 60% Goal status: Ongoing (see above)   3.  Pt will increase right hip strength to at least 4+/5 to improve ability to transfer her husband. Baseline:  Goal status: Ongoing (see above)   4.  Pt will report no increased pain when caring for her husband, including transferring her husband. Baseline:  Goal status: Ongoing      PLAN: PT FREQUENCY: 1-2x/week   PT DURATION: 8 weeks   PLANNED INTERVENTIONS: Therapeutic exercises, Therapeutic activity, Neuromuscular re-education, Balance training, Gait training, Patient/Family education, Self Care, Joint mobilization, Joint manipulation, Stair training,  Aquatic Therapy,  Dry Needling, Electrical stimulation, Cryotherapy, Moist heat, Taping, Ultrasound, Ionotophoresis 25m/ml Dexamethasone, Manual therapy, and Re-evaluation.   PLAN FOR NEXT SESSION: assess and progress HEP as indicated, strengthening, flexibility, core stability      SJuel Burrow PT 07/23/22 11:00 AM  [address] [place] [address]Phone # [phone] [phone]

## 2022-07-30 ENCOUNTER — Encounter: Payer: Self-pay | Admitting: Rehabilitative and Restorative Service Providers"

## 2022-07-30 ENCOUNTER — Ambulatory Visit: Payer: Medicare HMO | Admitting: Rehabilitative and Restorative Service Providers"

## 2022-07-30 DIAGNOSIS — M6281 Muscle weakness (generalized): Secondary | ICD-10-CM

## 2022-07-30 DIAGNOSIS — R262 Difficulty in walking, not elsewhere classified: Secondary | ICD-10-CM

## 2022-07-30 DIAGNOSIS — R2689 Other abnormalities of gait and mobility: Secondary | ICD-10-CM

## 2022-07-30 DIAGNOSIS — R252 Cramp and spasm: Secondary | ICD-10-CM | POA: Diagnosis not present

## 2022-07-30 NOTE — Therapy (Signed)
OUTPATIENT PHYSICAL THERAPY TREATMENT NOTE   Patient Name: Rachel Nichols MRN: [id number] DOB:11-09-1930, 88 y.o., female Today's Date: 07/30/2022  PCP: Isaac Bliss, Rayford Halsted, MD REFERRING PROVIDER: Isaac Bliss, Rayford Halsted, MD     END OF SESSION:   PT End of Session - 07/30/22 1019     Visit Number 26    Date for PT Re-Evaluation 08/10/22    Authorization Type [employer]    Progress Note Due on Visit 30    PT Start Time 1016    PT Stop Time 1055    PT Time Calculation (min) 39 min    Activity Tolerance Patient tolerated treatment well    Behavior During Therapy [facility] for tasks assessed/performed              Past Medical History:  Diagnosis Date   Adenomatous colon polyp 2002   Colonoscopy    Benign neoplasm of colon 2004   Colonoscopy   CAD (coronary artery disease)    echo 09/13/10- EF>55%, aortic valve is mildly sclerotic; myoview 02-20-12-no ischemia   Carotid bruit    carotid dopplers 05/16/04- normal   Diverticulosis 2002,2004,2007   Colonoscopy   Duodenitis    Fibrocystic breast disease    Gastritis    GERD (gastroesophageal reflux disease)    Heart attack ([place]) 1993   Cardiolyte stress---  stents 2012   Hiatal hernia    History of blood transfusion    pt denies   Hyperlipidemia    Peptic ulcer due to Helicobacter pylori    PUD (peptic ulcer disease)    SCC (squamous cell carcinoma) 04/24/2011   right jawline (CX35FU), left forearm (CX35FU)   Past Surgical History:  Procedure Laterality Date   [place]     From the nose   CATARACT EXTRACTION  10/26/09   Right eye   CATARACT EXTRACTION  11/09/09   Left eye   CHOLECYSTECTOMY  1984   CORONARY STENT PLACEMENT  01/10/11   hig grade ramus branch stenosis with mod LAD dz, PCI/stenting of ramus branch with a Resolute DES    DILATION AND CURETTAGE OF UTERUS     FINGER SURGERY  04/2011   Cyst removal, right hand forefinger   LEFT  OOPHORECTOMY  1957   SQUAMOUS CELL CARCINOMA EXCISION Right 07/24/2018   Posterior forearm shave & ED&C   Patient Active Problem List   Diagnosis Date Noted   IGT (impaired glucose tolerance) 01/12/2021   Hyperlipidemia LDL goal <70 01/13/2015   Murmur 01/13/2015   RUQ abdominal pain 01/22/2014   Pancreatic cyst 01/22/2014   Essential hypertension 03/06/2013   CAD S/P percutaneous coronary angioplasty 04/14/2012   GASTRITIS 10/19/2009   ABDOMINAL PAIN-EPIGASTRIC 10/18/2009   CHOLECYSTECTOMY, HX OF 10/18/2009   DIVERTICULOSIS, COLON 10/14/2009   COLONIC POLYPS, ADENOMATOUS, HX OF 10/14/2009   HIATAL HERNIA WITH REFLUX 09/19/2009   WEIGHT LOSS 09/19/2009   PUD, HX OF 09/19/2009   BACK PAIN 05/27/2008    REFERRING DIAG: M54.31 (ICD-10-CM) - Right sided sciatica  THERAPY DIAG:  Muscle weakness (generalized)  Other abnormalities of gait and mobility  Cramp and spasm  Difficulty in walking, not elsewhere classified  Rationale for Evaluation and Treatment Rehabilitation  PERTINENT HISTORY: None  PRECAUTIONS: none  SUBJECTIVE: Pt reports that she has been doing her exercises, but reports some increased pain today.  Pt states that her granddaughter passed away unexpectedly over the weekend.  PAIN:  Are you having pain? Yes: NPRS scale: 7/10 Pain location: back of leg Pain description: radiating Aggravating factors: standing  Relieving factors: rest, meds   OBJECTIVE: (objective measures completed at initial evaluation unless otherwise dated)  OBJECTIVE:    DIAGNOSTIC FINDINGS:  N/A   PATIENT SURVEYS:  04/24/22:  Modified Oswestry 30 / 50 = 60.0 %           06/01/22:  Modified Oswestry Low Back Pain Disability Questionnaire: 28 / 50 = 56.0 %  06/12/2022:  Modified Oswestry Low Back Pain Disability Questionnaire: 26 / 50 = 52.0 % 07/05/2022:  Modified Oswestry Low Back Pain Disability Questionnaire: 20 / 50 = 40.0 %            SENSATION: Reports numbness and tingling  down right leg   MUSCLE LENGTH: Right hamstring tightness noted.   POSTURE: rounded shoulders and forward head   PALPATION: Muscle spasms noted in lumbar multifidi   LUMBAR ROM: Limited at least 50% secondary to pain     LOWER EXTREMITY MMT:   Eval:  Right hip 4-/5 grossly throughout 06/12/2022:  R hip 4/5 grossly throughout    LUMBAR SPECIAL TESTS:  Slump test: Positive   FUNCTIONAL TESTS:  04/24/2022:  5 times sit to stand: 16.2 sec pushing up from thighs with 10/10 pain  05/15/2022:   3 min walk test: 189 ft with SPC.  Pt had to sit down at 2.5 minutes and sat for the last 30 sec of the assessment  05/25/2022: 3 min walk test: 284 ft with [facility].   06/01/22: 5 times sit to/from stand:  13.9 sec with UE pushing up from thighs and reports of 7/10 pain TUG: 12.4 sec with [facility]  06/12/2022: 3 min walk test: 438 ft with [facility]  06/28/2022: 3 min walk test: 480 ft with SPC   GAIT: Distance walked: 75 ft Assistive device utilized: None Level of assistance: Complete Independence Comments: Antalgic gait pattern noted with decreased step length       TODAY'S TREATMENT: 07/30/2022: Nustep level 4 (seat at 8, blue machine), x8 min.  PT present to discuss status Seated hamstring stretch on table 2x20 sec each bilat Seated piriformis stretch 2x20 sec each bilat 4 way pelvic tilt seated on dynadisc x30 each Seated with 3#:  heel/toe raises, LAQ, marching, and hip abduction scissors. 2x10 bilat each Seated with 6# on thigh with foot up and over small hurdle and back 2x10 bilat Seated hamstring curls with green tband 2x10 bilat Leg Press, seat at 6, 50# 2x10 Standing calf stretch with toes on 2" step 2x20 sec High marching on trampoline x1 min   07/23/2022: Nustep level 4 (seat at 9, blue machine), x8 min.  PT present to discuss status Seated hamstring stretch on table 2x20 sec each bilat Seated piriformis stretch 2x20 sec each bilat Seated with 3#:  heel/toe raises, LAQ, marching,  and hip abduction scissors. 2x10 bilat each Seated hamstring curls with green tband 2x10 bilat Seated with 6# on thigh with foot up and over small hurdle and back 2x10 bilat Leg Press, seat at 6, 50# 2x10 Standing calf stretch with toes on 2" step 2x20 sec Standing on foam heel tap down 2x10 bilat High marching on trampoline x1 min   07/19/2022: Seated piriformis stretch 2x20 sec each bilat Seated hamstring stretch on table 2x20 sec each bilat Seated with 3#:  heel/toe raises, LAQ, marching, and hip abduction scissors. 2x10 bilat each Nustep level 4 (seat at 9, blue machine), x8  min.  PT present to discuss status Seated with 6# on thigh with foot up and over small hurdle and back 2x10 bilat Seated hamstring curls with green tband 2x10 bilat High marching on trampoline x1 min Standing on foam heel tap down 2x10 bilat Standing calf stretch with toes on slanted rocker board 2x20 sec     PATIENT EDUCATION:  Education details: Issued HEP Person educated: Patient Education method: Explanation, Media planner, and Handouts Education comprehension: verbalized understanding and returned demonstration     HOME EXERCISE PROGRAM: Access Code: [facility] URL: [email or web address] Date: 06/28/2022 Prepared by: Shelby Dubin Jhene Westmoreland  Exercises - Seated Hamstring Stretch with Chair  - 1 x daily - 7 x weekly - 1 sets - 3 reps - 30 sec hold - Seated Sciatic Tensioner  - 1 x daily - 7 x weekly - 1 sets - 10 reps - 5 sec hold - Seated Long Arc Quad  - 1 x daily - 7 x weekly - 2 sets - 10 reps - Sit to Stand Without Arm Support  - 1 x daily - 7 x weekly - 2 sets - 5 reps - Seated Hip External Rotation AROM  - 1 x daily - 7 x weekly - 2 sets - 10 reps - Supine Posterior Pelvic Tilt  - 1 x daily - 7 x weekly - 2 sets - 10 reps - Supine March  - 1 x daily - 7 x weekly - 2 sets - 10 reps - Supine Lower Trunk Rotation  - 1 x daily - 7 x weekly - 2 sets - 10 reps - Supine Dead Bug with Leg  Extension  - 1 x daily - 7 x weekly - 2 sets - 10 reps    ASSESSMENT:   CLINICAL IMPRESSION: Ms Stegeman presents to skilled PT with continued reports of improvement and able to walk into clinic without using her cane.  Pt  with some increased pain today, but did experience the death of her granddaughter over the weekend and is grieving.  Pt able to continue with exercises and continues to report enjoying the leg press.  Pt continues to require skilled PT to progress towards goal related activities.     OBJECTIVE IMPAIRMENTS difficulty walking, decreased strength, increased muscle spasms, and pain.    ACTIVITY LIMITATIONS lifting, standing, and locomotion level   PARTICIPATION LIMITATIONS: cleaning, driving, and shopping   PERSONAL FACTORS Age and 1-2 comorbidities: GERD, Squamous Cell Carcinoma  are also affecting patient's functional outcome.    REHAB POTENTIAL: Good   CLINICAL DECISION MAKING: Evolving/moderate complexity   EVALUATION COMPLEXITY: Moderate     GOALS: Goals reviewed with patient? Yes   SHORT TERM GOALS: Target date: 05/15/2022   Pt will be independent with initial HEP. Baseline: Goal status: Goal Met 05/23/22   2.  Pt will report at least a 40% improvement in symptoms. Baseline:  Goal status: Goal Met 06/01/22     LONG TERM GOALS: Target date: 08/09/2022   Pt will be independent with advanced HEP. Baseline:  Goal status: ONGOING   2.  Pt will improve modified oswestry to 30% or less to demonstrate improvements with functional mobility. Baseline: 60% Goal status: Ongoing (see above)   3.  Pt will increase right hip strength to at least 4+/5 to improve ability to transfer her husband. Baseline:  Goal status: Ongoing (see above)   4.  Pt will report no increased pain when caring for her husband, including transferring her husband. Baseline:  Goal status: Ongoing  PLAN: PT FREQUENCY: 1-2x/week   PT DURATION: 8 weeks   PLANNED INTERVENTIONS:  Therapeutic exercises, Therapeutic activity, Neuromuscular re-education, Balance training, Gait training, Patient/Family education, Self Care, Joint mobilization, Joint manipulation, Stair training, Aquatic Therapy, Dry Needling, Electrical stimulation, Cryotherapy, Moist heat, Taping, Ultrasound, Ionotophoresis 103m/ml Dexamethasone, Manual therapy, and Re-evaluation.   PLAN FOR NEXT SESSION: assess and progress HEP as indicated, strengthening, flexibility, core stability      SJuel Burrow PT 07/30/22 11:09 AM  [facility] [address] [place] [address]Phone # [phone] [phone]

## 2022-08-02 ENCOUNTER — Encounter: Payer: Self-pay | Admitting: Rehabilitative and Restorative Service Providers"

## 2022-08-02 ENCOUNTER — Ambulatory Visit: Payer: Medicare HMO | Admitting: Rehabilitative and Restorative Service Providers"

## 2022-08-02 DIAGNOSIS — R252 Cramp and spasm: Secondary | ICD-10-CM | POA: Diagnosis not present

## 2022-08-02 DIAGNOSIS — R262 Difficulty in walking, not elsewhere classified: Secondary | ICD-10-CM | POA: Diagnosis not present

## 2022-08-02 DIAGNOSIS — R2689 Other abnormalities of gait and mobility: Secondary | ICD-10-CM | POA: Diagnosis not present

## 2022-08-02 DIAGNOSIS — M6281 Muscle weakness (generalized): Secondary | ICD-10-CM

## 2022-08-02 NOTE — Therapy (Signed)
OUTPATIENT PHYSICAL THERAPY TREATMENT NOTE   Patient Name: Rachel Nichols MRN: [id number] DOB:Mar 19, 1931, 88 y.o., female Today's Date: 08/02/2022  PCP: Isaac Bliss, Rayford Halsted, MD REFERRING PROVIDER: Isaac Bliss, Rayford Halsted, MD     END OF SESSION:   PT End of Session - 08/02/22 1025     Visit Number 27    Date for PT Re-Evaluation 08/10/22    Authorization Type [employer] Medicare    Progress Note Due on Visit 30    PT Start Time 1016    PT Stop Time 1055    PT Time Calculation (min) 39 min    Activity Tolerance Patient tolerated treatment well    Behavior During Therapy [facility] for tasks assessed/performed              Past Medical History:  Diagnosis Date   Adenomatous colon polyp 2002   Colonoscopy    Benign neoplasm of colon 2004   Colonoscopy   CAD (coronary artery disease)    echo 09/13/10- EF>55%, aortic valve is mildly sclerotic; myoview 02-20-12-no ischemia   Carotid bruit    carotid dopplers 05/16/04- normal   Diverticulosis 2002,2004,2007   Colonoscopy   Duodenitis    Fibrocystic breast disease    Gastritis    GERD (gastroesophageal reflux disease)    Heart attack ([place]) 1993   Cardiolyte stress---  stents 2012   Hiatal hernia    History of blood transfusion    pt denies   Hyperlipidemia    Peptic ulcer due to Helicobacter pylori    PUD (peptic ulcer disease)    SCC (squamous cell carcinoma) 04/24/2011   right jawline (CX35FU), left forearm (CX35FU)   Past Surgical History:  Procedure Laterality Date   [place]     From the nose   CATARACT EXTRACTION  10/26/09   Right eye   CATARACT EXTRACTION  11/09/09   Left eye   CHOLECYSTECTOMY  1984   CORONARY STENT PLACEMENT  01/10/11   hig grade ramus branch stenosis with mod LAD dz, PCI/stenting of ramus branch with a Resolute DES    DILATION AND CURETTAGE OF UTERUS     FINGER SURGERY  04/2011   Cyst removal, right hand forefinger   LEFT  OOPHORECTOMY  1957   SQUAMOUS CELL CARCINOMA EXCISION Right 07/24/2018   Posterior forearm shave & ED&C   Patient Active Problem List   Diagnosis Date Noted   IGT (impaired glucose tolerance) 01/12/2021   Hyperlipidemia LDL goal <70 01/13/2015   Murmur 01/13/2015   RUQ abdominal pain 01/22/2014   Pancreatic cyst 01/22/2014   Essential hypertension 03/06/2013   CAD S/P percutaneous coronary angioplasty 04/14/2012   GASTRITIS 10/19/2009   ABDOMINAL PAIN-EPIGASTRIC 10/18/2009   CHOLECYSTECTOMY, HX OF 10/18/2009   DIVERTICULOSIS, COLON 10/14/2009   COLONIC POLYPS, ADENOMATOUS, HX OF 10/14/2009   HIATAL HERNIA WITH REFLUX 09/19/2009   WEIGHT LOSS 09/19/2009   PUD, HX OF 09/19/2009   BACK PAIN 05/27/2008    REFERRING DIAG: M54.31 (ICD-10-CM) - Right sided sciatica  THERAPY DIAG:  Muscle weakness (generalized)  Other abnormalities of gait and mobility  Cramp and spasm  Difficulty in walking, not elsewhere classified  Rationale for Evaluation and Treatment Rehabilitation  PERTINENT HISTORY: None  PRECAUTIONS: none  SUBJECTIVE: Pt reports that she "tries" to do her exercises, but is not always compliant.  Pt reports that she her husband is keeping her busy and stressed.  She is  hoping that he can return back to the [facility] soon.  Pt states that she feels like she is continuing to lose weight, despite eating normally.  PAIN:  Are you having pain? Yes: NPRS scale: 7-8/10 Pain location: back of leg Pain description: radiating Aggravating factors: standing  Relieving factors: rest, meds   OBJECTIVE: (objective measures completed at initial evaluation unless otherwise dated)  OBJECTIVE:    DIAGNOSTIC FINDINGS:  N/A   PATIENT SURVEYS:  04/24/22:  Modified Oswestry 30 / 50 = 60.0 %           06/01/22:  Modified Oswestry Low Back Pain Disability Questionnaire: 28 / 50 = 56.0 %  06/12/2022:  Modified Oswestry Low Back Pain Disability Questionnaire: 26 / 50 = 52.0  % 07/05/2022:  Modified Oswestry Low Back Pain Disability Questionnaire: 20 / 50 = 40.0 %            SENSATION: Reports numbness and tingling down right leg   MUSCLE LENGTH: Right hamstring tightness noted.   POSTURE: rounded shoulders and forward head   PALPATION: Muscle spasms noted in lumbar multifidi   LUMBAR ROM: Limited at least 50% secondary to pain     LOWER EXTREMITY MMT:   Eval:  Right hip 4-/5 grossly throughout 06/12/2022:  R hip 4/5 grossly throughout    LUMBAR SPECIAL TESTS:  Slump test: Positive   FUNCTIONAL TESTS:  04/24/2022:  5 times sit to stand: 16.2 sec pushing up from thighs with 10/10 pain  05/15/2022:   3 min walk test: 189 ft with SPC.  Pt had to sit down at 2.5 minutes and sat for the last 30 sec of the assessment  05/25/2022: 3 min walk test: 284 ft with [facility].   06/01/22: 5 times sit to/from stand:  13.9 sec with UE pushing up from thighs and reports of 7/10 pain TUG: 12.4 sec with [facility]  06/12/2022: 3 min walk test: 438 ft with [facility]  06/28/2022: 3 min walk test: 480 ft with [facility]  08/02/2022: 3 min walk test: 589 ft without AD   GAIT: Distance walked: 75 ft Assistive device utilized: None Level of assistance: Complete Independence Comments: Antalgic gait pattern noted with decreased step length       TODAY'S TREATMENT: 08/02/2022: Nustep level 5 (seat at 8, green machine), x8 min.  PT present to discuss status Seated with 3#:  heel/toe raises, LAQ, marching, and hip abduction scissors. 2x10 bilat each Seated hamstring stretch 2x20 sec each bilat Seated piriformis stretch 2x20 sec each bilat Lying in prone position performing [place] to bilat lumbar, glutes/piriformis, IT band/TFL, hamstrings, and calves 3 minute walk test x589 ft without assistive device   07/30/2022: Nustep level 4 (seat at 8, blue machine), x8 min.  PT present to discuss status Seated hamstring stretch on table 2x20 sec each bilat Seated piriformis stretch 2x20 sec each  bilat 4 way pelvic tilt seated on dynadisc x30 each Seated with 3#:  heel/toe raises, LAQ, marching, and hip abduction scissors. 2x10 bilat each Seated with 6# on thigh with foot up and over small hurdle and back 2x10 bilat Seated hamstring curls with green tband 2x10 bilat Leg Press, seat at 6, 50# 2x10 Standing calf stretch with toes on 2" step 2x20 sec High marching on trampoline x1 min   07/23/2022: Nustep level 4 (seat at 9, blue machine), x8 min.  PT present to discuss status Seated hamstring stretch on table 2x20 sec each bilat Seated piriformis stretch 2x20 sec each bilat  Seated with 3#:  heel/toe raises, LAQ, marching, and hip abduction scissors. 2x10 bilat each Seated hamstring curls with green tband 2x10 bilat Seated with 6# on thigh with foot up and over small hurdle and back 2x10 bilat Leg Press, seat at 6, 50# 2x10 Standing calf stretch with toes on 2" step 2x20 sec Standing on foam heel tap down 2x10 bilat High marching on trampoline x1 min     PATIENT EDUCATION:  Education details: Issued HEP Person educated: Patient Education method: Explanation, Demonstration, and Handouts Education comprehension: verbalized understanding and returned demonstration     HOME EXERCISE PROGRAM: Access Code: [facility] URL: [email or web address] Date: 06/28/2022 Prepared by: Shelby Dubin Yorel Redder  Exercises - Seated Hamstring Stretch with Chair  - 1 x daily - 7 x weekly - 1 sets - 3 reps - 30 sec hold - Seated Sciatic Tensioner  - 1 x daily - 7 x weekly - 1 sets - 10 reps - 5 sec hold - Seated Long Arc Quad  - 1 x daily - 7 x weekly - 2 sets - 10 reps - Sit to Stand Without Arm Support  - 1 x daily - 7 x weekly - 2 sets - 5 reps - Seated Hip External Rotation AROM  - 1 x daily - 7 x weekly - 2 sets - 10 reps - Supine Posterior Pelvic Tilt  - 1 x daily - 7 x weekly - 2 sets - 10 reps - Supine March  - 1 x daily - 7 x weekly - 2 sets - 10 reps - Supine Lower Trunk Rotation   - 1 x daily - 7 x weekly - 2 sets - 10 reps - Supine Dead Bug with Leg Extension  - 1 x daily - 7 x weekly - 2 sets - 10 reps    ASSESSMENT:   CLINICAL IMPRESSION: Ms Osoria presents to skilled PT with continued reports of increased pain and reports of feeling as if she is still losing weight, despite note trying to lose weight.  Pt continues to make gains towards improved strength and improved ambulation.  However, due to continued pain and weight loss, recommended pt follow up with her MD for further assessment.  Pt with increased distance noted on 3 min walk test.  Pt continues to require skilled PT and will have PT reassessment next visit.     OBJECTIVE IMPAIRMENTS difficulty walking, decreased strength, increased muscle spasms, and pain.    ACTIVITY LIMITATIONS lifting, standing, and locomotion level   PARTICIPATION LIMITATIONS: cleaning, driving, and shopping   PERSONAL FACTORS Age and 1-2 comorbidities: GERD, Squamous Cell Carcinoma  are also affecting patient's functional outcome.    REHAB POTENTIAL: Good   CLINICAL DECISION MAKING: Evolving/moderate complexity   EVALUATION COMPLEXITY: Moderate     GOALS: Goals reviewed with patient? Yes   SHORT TERM GOALS: Target date: 05/15/2022   Pt will be independent with initial HEP. Baseline: Goal status: Goal Met 05/23/22   2.  Pt will report at least a 40% improvement in symptoms. Baseline:  Goal status: Goal Met 06/01/22     LONG TERM GOALS: Target date: 08/09/2022   Pt will be independent with advanced HEP. Baseline:  Goal status: ONGOING   2.  Pt will improve modified oswestry to 30% or less to demonstrate improvements with functional mobility. Baseline: 60% Goal status: Ongoing (see above)   3.  Pt will increase right hip strength to at least 4+/5 to improve ability to transfer her husband. Baseline:  Goal  status: Ongoing (see above)   4.  Pt will report no increased pain when caring for her husband, including  transferring her husband. Baseline:  Goal status: Ongoing      PLAN: PT FREQUENCY: 1-2x/week   PT DURATION: 8 weeks   PLANNED INTERVENTIONS: Therapeutic exercises, Therapeutic activity, Neuromuscular re-education, Balance training, Gait training, Patient/Family education, Self Care, Joint mobilization, Joint manipulation, Stair training, Aquatic Therapy, Dry Needling, Electrical stimulation, Cryotherapy, Moist heat, Taping, Ultrasound, Ionotophoresis 23m/ml Dexamethasone, Manual therapy, and Re-evaluation.   PLAN FOR NEXT SESSION: assess and progress HEP as indicated, strengthening, flexibility, core stability      SJuel Burrow PT 08/02/22 10:58 AM  [facility] [address] [place] [address]Phone # [phone] [phone]

## 2022-08-06 ENCOUNTER — Ambulatory Visit: Payer: Medicare HMO | Admitting: Rehabilitative and Restorative Service Providers"

## 2022-08-06 ENCOUNTER — Encounter: Payer: Self-pay | Admitting: Rehabilitative and Restorative Service Providers"

## 2022-08-06 DIAGNOSIS — M6281 Muscle weakness (generalized): Secondary | ICD-10-CM | POA: Diagnosis not present

## 2022-08-06 DIAGNOSIS — R252 Cramp and spasm: Secondary | ICD-10-CM | POA: Diagnosis not present

## 2022-08-06 DIAGNOSIS — R262 Difficulty in walking, not elsewhere classified: Secondary | ICD-10-CM | POA: Diagnosis not present

## 2022-08-06 DIAGNOSIS — R2689 Other abnormalities of gait and mobility: Secondary | ICD-10-CM

## 2022-08-06 NOTE — Patient Instructions (Signed)

## 2022-08-06 NOTE — Therapy (Signed)
OUTPATIENT PHYSICAL THERAPY TREATMENT NOTE   Patient Name: Rachel Nichols MRN: [id number] DOB:[birth year], 88 y.o., female Today's Date: 08/06/2022  PCP: Rachel Nichols, Rachel Halsted, MD REFERRING PROVIDER: Isaac Nichols, Rachel Halsted, MD     END OF SESSION:   PT End of Session - 08/06/22 1020     Visit Number 28    Date for PT Re-Evaluation 08/10/22    Authorization Type [employer]    Progress Note Due on Visit 30    PT Start Time 1015    PT Stop Time 1055    PT Time Calculation (min) 40 min    Activity Tolerance Patient tolerated treatment well    Behavior During Therapy [facility] for tasks assessed/performed              Past Medical History:  Diagnosis Date   Adenomatous colon polyp 2002   Colonoscopy    Benign neoplasm of colon 2004   Colonoscopy   CAD (coronary artery disease)    echo 09/13/10- EF>55%, aortic valve is mildly sclerotic; myoview 02-20-12-no ischemia   Carotid bruit    carotid dopplers 05/16/04- normal   Diverticulosis 2002,2004,2007   Colonoscopy   Duodenitis    Fibrocystic breast disease    Gastritis    GERD (gastroesophageal reflux disease)    Heart attack ([place]) 1993   Cardiolyte stress---  stents 2012   Hiatal hernia    History of blood transfusion    pt denies   Hyperlipidemia    Peptic ulcer due to Helicobacter pylori    PUD (peptic ulcer disease)    SCC (squamous cell carcinoma) 04/24/2011   right jawline (CX35FU), left forearm (CX35FU)   Past Surgical History:  Procedure Laterality Date   [place]     From the nose   CATARACT EXTRACTION  10/26/09   Right eye   CATARACT EXTRACTION  11/09/09   Left eye   CHOLECYSTECTOMY  1984   CORONARY STENT PLACEMENT  01/10/11   hig grade ramus branch stenosis with mod LAD dz, PCI/stenting of ramus branch with a Resolute DES    DILATION AND CURETTAGE OF UTERUS     FINGER SURGERY  04/2011   Cyst removal, right hand forefinger   LEFT  OOPHORECTOMY  1957   SQUAMOUS CELL CARCINOMA EXCISION Right 07/24/2018   Posterior forearm shave & ED&C   Patient Active Problem List   Diagnosis Date Noted   IGT (impaired glucose tolerance) 01/12/2021   Hyperlipidemia LDL goal <70 01/13/2015   Murmur 01/13/2015   RUQ abdominal pain 01/22/2014   Pancreatic cyst 01/22/2014   Essential hypertension 03/06/2013   CAD S/P percutaneous coronary angioplasty 04/14/2012   GASTRITIS 10/19/2009   ABDOMINAL PAIN-EPIGASTRIC 10/18/2009   CHOLECYSTECTOMY, HX OF 10/18/2009   DIVERTICULOSIS, COLON 10/14/2009   COLONIC POLYPS, ADENOMATOUS, HX OF 10/14/2009   HIATAL HERNIA WITH REFLUX 09/19/2009   WEIGHT LOSS 09/19/2009   PUD, HX OF 09/19/2009   BACK PAIN 05/27/2008    REFERRING DIAG: M54.31 (ICD-10-CM) - Right sided sciatica  THERAPY DIAG:  Muscle weakness (generalized)  Other abnormalities of gait and mobility  Cramp and spasm  Difficulty in walking, not elsewhere classified  Rationale for Evaluation and Treatment Rehabilitation  PERTINENT HISTORY: None  PRECAUTIONS: none  SUBJECTIVE: Pt reports that she is having some soreness this morning, but as she is on the NuStep, her pain decreases.  PAIN:  Are you having pain? Yes: NPRS scale:  5-7/10 Pain location: back of leg Pain description: radiating Aggravating factors: standing  Relieving factors: rest, meds   OBJECTIVE: (objective measures completed at initial evaluation unless otherwise dated)  OBJECTIVE:    DIAGNOSTIC FINDINGS:  N/A   PATIENT SURVEYS:  04/24/22:  Modified Oswestry 30 / 50 = 60.0 %           06/01/22:  Modified Oswestry Low Back Pain Disability Questionnaire: 28 / 50 = 56.0 %  06/12/2022:  Modified Oswestry Low Back Pain Disability Questionnaire: 26 / 50 = 52.0 % 07/05/2022:  Modified Oswestry Low Back Pain Disability Questionnaire: 20 / 50 = 40.0 %            SENSATION: Reports numbness and tingling down right leg   MUSCLE LENGTH: Right hamstring  tightness noted.   POSTURE: rounded shoulders and forward head   PALPATION: Muscle spasms noted in lumbar multifidi   LUMBAR ROM: Limited at least 50% secondary to pain     LOWER EXTREMITY MMT:   Eval:  Right hip 4-/5 grossly throughout 06/12/2022:  R hip 4/5 grossly throughout    LUMBAR SPECIAL TESTS:  Slump test: Positive   FUNCTIONAL TESTS:  04/24/2022:  5 times sit to stand: 16.2 sec pushing up from thighs with 10/10 pain  05/15/2022:   3 min walk test: 189 ft with SPC.  Pt had to sit down at 2.5 minutes and sat for the last 30 sec of the assessment  05/25/2022: 3 min walk test: 284 ft with [facility].   06/01/22: 5 times sit to/from stand:  13.9 sec with UE pushing up from thighs and reports of 7/10 pain TUG: 12.4 sec with [facility]  06/12/2022: 3 min walk test: 438 ft with [facility]  06/28/2022: 3 min walk test: 480 ft with [facility]  08/02/2022: 3 min walk test: 589 ft without AD   GAIT: Distance walked: 75 ft Assistive device utilized: None Level of assistance: Complete Independence Comments: Antalgic gait pattern noted with decreased step length       TODAY'S TREATMENT: 08/06/2022: Nustep level 4 (seat at 8, blue machine), x8 min.  PT present to discuss status Trigger Point Dry-Needling  Treatment instructions: Expect mild to moderate muscle soreness. S/S of pneumothorax if dry needled over a lung field, and to seek immediate medical attention should they occur. Patient verbalized understanding of these instructions and education. Patient Consent Given: Yes Education handout provided: Yes Muscles treated: right lumbar multifidi, right glute, right piriformis Electrical stimulation performed: No Parameters: N/A Treatment response/outcome: Utilized skilled palpation to locate/identify trigger points.  Able to illicit muscle twitch response and muscle elongation following Supine lower trunk rotation 5x5 sec bilat Supine piriformis stretch 2x20 sec bilat Standing at step hamstring  stretch 2x20 sec bilat 4 way pelvic tilt seated on dynadisc x30 each Seated with 3#:  heel/toe raises, LAQ, marching, and hip abduction scissors. 2x10 bilat each Leg Press, seat at 6, 50# 2x10   08/02/2022: Nustep level 5 (seat at 8, green machine), x8 min.  PT present to discuss status Seated with 3#:  heel/toe raises, LAQ, marching, and hip abduction scissors. 2x10 bilat each Seated hamstring stretch 2x20 sec each bilat Seated piriformis stretch 2x20 sec each bilat Lying in prone position performing [place] to bilat lumbar, glutes/piriformis, IT band/TFL, hamstrings, and calves 3 minute walk test x589 ft without assistive device   07/30/2022: Nustep level 4 (seat at 8, blue machine), x8 min.  PT present to discuss status Seated hamstring stretch on table 2x20 sec  each bilat Seated piriformis stretch 2x20 sec each bilat 4 way pelvic tilt seated on dynadisc x30 each Seated with 3#:  heel/toe raises, LAQ, marching, and hip abduction scissors. 2x10 bilat each Seated with 6# on thigh with foot up and over small hurdle and back 2x10 bilat Seated hamstring curls with green tband 2x10 bilat Leg Press, seat at 6, 50# 2x10 Standing calf stretch with toes on 2" step 2x20 sec High marching on trampoline x1 min     PATIENT EDUCATION:  Education details: Issued HEP Person educated: Patient Education method: Explanation, Demonstration, and Handouts Education comprehension: verbalized understanding and returned demonstration     HOME EXERCISE PROGRAM: Access Code: [facility] URL: [email or web address] Date: 06/28/2022 Prepared by: Shelby Dubin Nathin Saran  Exercises - Seated Hamstring Stretch with Chair  - 1 x daily - 7 x weekly - 1 sets - 3 reps - 30 sec hold - Seated Sciatic Tensioner  - 1 x daily - 7 x weekly - 1 sets - 10 reps - 5 sec hold - Seated Long Arc Quad  - 1 x daily - 7 x weekly - 2 sets - 10 reps - Sit to Stand Without Arm Support  - 1 x daily - 7 x weekly - 2 sets - 5  reps - Seated Hip External Rotation AROM  - 1 x daily - 7 x weekly - 2 sets - 10 reps - Supine Posterior Pelvic Tilt  - 1 x daily - 7 x weekly - 2 sets - 10 reps - Supine March  - 1 x daily - 7 x weekly - 2 sets - 10 reps - Supine Lower Trunk Rotation  - 1 x daily - 7 x weekly - 2 sets - 10 reps - Supine Dead Bug with Leg Extension  - 1 x daily - 7 x weekly - 2 sets - 10 reps    ASSESSMENT:   CLINICAL IMPRESSION: Ms Salsbury presents to skilled PT with continued reports of pain and was agreeable to trying dry needling today.  Pt provided with handout for dry needling/trigger point release.  Pt reports some release of muscle tension following dry needling with great twitch response palpated.  Will reassess pt next visit to assess response to dry needling and if pt would benefit from continued PT vs discharge.     OBJECTIVE IMPAIRMENTS difficulty walking, decreased strength, increased muscle spasms, and pain.    ACTIVITY LIMITATIONS lifting, standing, and locomotion level   PARTICIPATION LIMITATIONS: cleaning, driving, and shopping   PERSONAL FACTORS Age and 1-2 comorbidities: GERD, Squamous Cell Carcinoma  are also affecting patient's functional outcome.    REHAB POTENTIAL: Good   CLINICAL DECISION MAKING: Evolving/moderate complexity   EVALUATION COMPLEXITY: Moderate     GOALS: Goals reviewed with patient? Yes   SHORT TERM GOALS: Target date: 05/15/2022   Pt will be independent with initial HEP. Baseline: Goal status: Goal Met 05/23/22   2.  Pt will report at least a 40% improvement in symptoms. Baseline:  Goal status: Goal Met 06/01/22     LONG TERM GOALS: Target date: 08/09/2022   Pt will be independent with advanced HEP. Baseline:  Goal status: ONGOING   2.  Pt will improve modified oswestry to 30% or less to demonstrate improvements with functional mobility. Baseline: 60% Goal status: Ongoing (see above)   3.  Pt will increase right hip strength to at least 4+/5 to  improve ability to transfer her husband. Baseline:  Goal status: Ongoing (see above)   4.  Pt will report no increased pain when caring for her husband, including transferring her husband. Baseline:  Goal status: Ongoing      PLAN: PT FREQUENCY: 1-2x/week   PT DURATION: 8 weeks   PLANNED INTERVENTIONS: Therapeutic exercises, Therapeutic activity, Neuromuscular re-education, Balance training, Gait training, Patient/Family education, Self Care, Joint mobilization, Joint manipulation, Stair training, Aquatic Therapy, Dry Needling, Electrical stimulation, Cryotherapy, Moist heat, Taping, Ultrasound, Ionotophoresis 24m/ml Dexamethasone, Manual therapy, and Re-evaluation.   PLAN FOR NEXT SESSION: assess and progress HEP as indicated, assess response to dry needling, strengthening, flexibility, core stability, reassessment      SJuel Burrow PT 08/06/22 10:59 AM  [facility] [address] [place] [address]Phone # [phone] [phone]

## 2022-08-08 NOTE — Progress Notes (Unsigned)
Cardiology Office Note:    Date:  08/09/2022   ID:  Rachel Nichols, DOB 1931-03-10, MRN [id number]  PCP:  Isaac Bliss, Rayford Halsted, MD   [place] Prov[facility] Cardiologist:  Shelva Majestic, MD Cardiology APP:  Ledora [place], [place] { Referring MD: Isaac Bliss, [place]*   Chief Complaint  Patient presents with   Follow-up    CAD    History of Present Illness:    Rachel Nichols is a 88 y.o. female with a hx of CAD, hyperlipidemia, heart murmur without echocardiogram evidence of valvular disease, prediabetes, and GERD.  Her CAD dates back to 1993 when she suffered an MI and underwent PTCA of her RCA.  In April 2012 a DES-proximal ramus 2.5 x 12 mm.  She had a 60% LAD stenosis after a diagonal vessel, 40-50% mid RCA, both treated medically.  Nuclear stress test May 2013 did not show any ischemia.  Echocardiogram 03/23/2019 showed an LVEF 60-65%, normal RV function, and aortic valve sclerosis without stenosis.  She was last seen by Dr. Claiborne Billings 05/2021 and she was doing well at that time.  She was participating in physical therapy.  She presents today for scheduled cardiology follow-up. She is in pain from sciatic nerve pain on right leg.  She takes care of her 90 year old husband who has late stage dementia.  She is active and takes care of her house.  She can walk the [place] store and carry groceries in from the car.  She is able to get up a flight of stairs on days that she has less pain in her right leg.  She denies chest pain, dyspnea, lower extremity swelling.  She does not have orthopnea or PND.  She is not sleeping well at night as her husband routinely wakes her up and makes her move to another room.  She needs refills on her cardiac medications. I suspect she may need cardiac evaluation prior to possible back injections versus surgery for her sciatic nerve pain.  Past Medical History:  Diagnosis Date   Adenomatous colon polyp 2002   Colonoscopy    Benign neoplasm  of colon 2004   Colonoscopy   CAD (coronary artery disease)    echo 09/13/10- EF>55%, aortic valve is mildly sclerotic; myoview 02-20-12-no ischemia   Carotid bruit    carotid dopplers 05/16/04- normal   Diverticulosis 2002,2004,2007   Colonoscopy   Duodenitis    Fibrocystic breast disease    Gastritis    GERD (gastroesophageal reflux disease)    Heart attack ([place]) 1993   Cardiolyte stress---  stents 2012   Hiatal hernia    History of blood transfusion    pt denies   Hyperlipidemia    Peptic ulcer due to Helicobacter pylori    PUD (peptic ulcer disease)    SCC (squamous cell carcinoma) 04/24/2011   right jawline (CX35FU), left forearm (CX35FU)    Past Surgical History:  Procedure Laterality Date   [place]   BASAL CELL CARCINOMA EXCISION     From the nose   CATARACT EXTRACTION  10/26/09   Right eye   CATARACT EXTRACTION  11/09/09   Left eye   CHOLECYSTECTOMY  1984   CORONARY STENT PLACEMENT  01/10/11   hig grade ramus branch stenosis with mod LAD dz, PCI/stenting of ramus branch with a Resolute DES    DILATION AND CURETTAGE OF UTERUS     FINGER SURGERY  04/2011   Cyst  removal, right hand forefinger   LEFT OOPHORECTOMY  1957   SQUAMOUS CELL CARCINOMA EXCISION Right 07/24/2018   Posterior forearm shave & ED&C    Current Medications: Current Meds  Medication Sig   aspirin 81 MG tablet Take 81 mg by mouth daily.   cholecalciferol (VITAMIN D) 1000 UNITS tablet Take 2,000 Units by mouth daily.   Coenzyme Q10 (CO Q10) 100 MG CAPS Take 200 mg by mouth daily.   Cyanocobalamin (VITAMIN B 12 PO) Take 500 mg by mouth daily.   meloxicam (MOBIC) 7.5 MG tablet Take 1 tablet (7.5 mg total) by mouth daily.   Multiple Vitamins-Minerals (CENTRUM SILVER ULTRA WOMENS) TABS Take 1 tablet by mouth daily.   omeprazole (PRILOSEC) 20 MG capsule Take 1 capsule (20 mg total) by mouth daily.   saccharomyces boulardii (FLORASTOR) 250 MG capsule Take 1 capsule (250 mg  total) by mouth 2 (two) times daily.   sucralfate (CARAFATE) 1 GM/10ML suspension Take 10 mLs (1 g total) by mouth 4 (four) times daily -  with meals and at bedtime.   [DISCONTINUED] atenolol (TENORMIN) 25 MG tablet    [DISCONTINUED] ezetimibe (ZETIA) 10 MG tablet TAKE 1 TABLET EVERY DAY   [DISCONTINUED] metoprolol succinate (TOPROL-XL) 25 MG 24 hr tablet TAKE 1/2 TABLET EVERY DAY NEED APPOINTMENT   [DISCONTINUED] rosuvastatin (CRESTOR) 10 MG tablet TAKE 1 TABLET EVERY DAY. MUST ATTEND UPCOMING APPOINTMENT FOR FUTURE REFILLS     Allergies:   Diltiazem hcl, Tape, and Tramadol hcl   Social History   Socioeconomic History   Marital status: Married    Spouse name: Not on file   Number of children: 2   Years of education: Not on file   Highest education level: Not on file  Occupational History   Occupation: retired Research scientist (physical sciences): RETIRED  Tobacco Use   Smoking status: Former    Packs/day: 2.00    Years: 15.00    Total pack years: 30.00    Types: Cigarettes    Quit date: 10/09/1975    Years since quitting: 46.8   Smokeless tobacco: Never  Vaping Use   Vaping Use: Never used  Substance and Sexual Activity   Alcohol use: No    Comment: none   Drug use: No   Sexual activity: Not Currently    Partners: Male  Other Topics Concern   Not on file  Social History Narrative   Exercise--  Gym 3 days a week,   2 days swim   Social Determinants of Health   Financial Resource Strain: Not on file  Food Insecurity: Not on file  Transportation Needs: Not on file  Physical Activity: Not on file  Stress: Not on file  Social Connections: Not on file     Family History: The patient's family history includes Breast cancer in her maternal aunt; Epilepsy in her daughter; Heart attack in her brother, father, paternal aunt, paternal grandfather, and paternal grandmother; Heart disease in her father and sister; Heart disease (age of onset: 61) in her brother; Hypertension in her  sister; Irritable bowel syndrome in her brother; Neuropathy in her sister; Stroke in her maternal grandfather and mother. There is no history of Colon cancer.  ROS:   Please see the history of present illness.     All other systems reviewed and are negative.  EKGs/Labs/Other Studies Reviewed:    The following studies were reviewed today:  Echo 2020  1. The left ventricle has normal systolic function with an ejection  fraction of 60-65%. The cavity size was normal. There is moderately  increased left ventricular wall thickness. Left ventricular diastolic  parameters were normal.   2. The right ventricle has normal systolic function. The cavity was  normal. There is no increase in right ventricular wall thickness.   3. Mild thickening of the mitral valve leaflet.   4. The tricuspid valve is grossly normal.   5. The aortic valve is tricuspid. Mild thickening of the aortic valve.  Mild calcification of the aortic valve.   EKG:  EKG is not ordered today.   Recent Labs: 05/02/2022: ALT 54; BUN 24; Creatinine, Ser 0.85; Hemoglobin 13.7; Platelets 188; Potassium 4.3; Sodium 136  Recent Lipid Panel    Component Value Date/Time   CHOL 131 01/12/2021 0907   CHOL 135 04/09/2019 1026   TRIG 131.0 01/12/2021 0907   HDL 48.60 01/12/2021 0907   HDL 48 04/09/2019 1026   CHOLHDL 3 01/12/2021 0907   VLDL 26.2 01/12/2021 0907   LDLCALC 56 01/12/2021 0907   LDLCALC 57 04/09/2019 1026     Risk Assessment/Calculations:                Physical Exam:    VS:  BP 132/72 (BP Location: Left Arm, Patient Position: Sitting, Cuff Size: Normal)   Pulse 63   Ht '5\' 4"'$  (1.626 m)   Wt 117 lb 9.6 oz (53.3 kg)   SpO2 97%   BMI 20.19 kg/m     Wt Readings from Last 3 Encounters:  08/09/22 117 lb 9.6 oz (53.3 kg)  05/07/22 121 lb 3.2 oz (55 kg)  04/30/22 127 lb (57.6 kg)     GEN: elderly female in NAD HEENT: Normal NECK: No JVD; No carotid bruits LYMPHATICS: No lymphadenopathy CARDIAC: RRR,  2/6 systolic murmur RESPIRATORY:  Clear to auscultation without rales, wheezing or rhonchi  ABDOMEN: Soft, non-tender, non-distended MUSCULOSKELETAL:  No edema; No deformity  SKIN: Warm and dry NEUROLOGIC:  Alert and oriented x 3 PSYCHIATRIC:  Normal affect   ASSESSMENT:    1. CAD S/P percutaneous coronary angioplasty   2. Right sided sciatica   3. CAD in native artery   4. Primary hypertension   5. Hyperlipidemia with target LDL less than 70   6. Aortic valve sclerosis   7. Preoperative cardiovascular examination   8. Hyperlipidemia    PLAN:    In order of problems listed above:  CAD s/p PCI 1993, 2012 Hypertension Hyperlipidemia Doing well, no angina BP controlled Continue BB, ASA, crestor, zetia I do not feel strongly about repeating a lipid panel in her   Aortic valve sclerosis without stenosis Last echo 2020 We will repeat an echocardiogram today   Preoperative risk evaluation for Mace prior to possible back injections In anticipation for possible back injections, I was able to discuss cardiac risk.  She is able to complete 4.0 METS without angina.  She does have a history of CAD dating back to 81.  However she does not have any unstable cardiac issues.  According to the RCRI she has approximately 1% risk of Mace.  Back injections are relatively low risk.  I will obtain an echocardiogram to evaluate her aortic valve.  I do not recommend a nuclear stress test prior to back injections.  However should she need more invasive procedure/surgery, we will need to reevaluate.  She may hold aspirin 5 to 7 days for back injections.      Follow up in 6 months.  Medication Adjustments/Labs and Tests Ordered: Current medicines are reviewed at length with the patient today.  Concerns regarding medicines are outlined above.  Orders Placed This Encounter  Procedures   ECHOCARDIOGRAM COMPLETE   Meds ordered this encounter  Medications   metoprolol succinate (TOPROL-XL)  25 MG 24 hr tablet    Sig: Take 0.5 tablets (12.5 mg total) by mouth daily.    Dispense:  90 tablet    Refill:  2    PLEASE KEEP SCHEDULED APPT FOR FUTURE REFILLS   atenolol (TENORMIN) 25 MG tablet    Sig: Take 1 tablet (25 mg total) by mouth daily.    Dispense:  90 tablet    Refill:  2   rosuvastatin (CRESTOR) 10 MG tablet    Sig: TAKE 1 TABLET EVERY DAY. MUST ATTEND UPCOMING APPOINTMENT FOR FUTURE REFILLS    Dispense:  90 tablet    Refill:  3   ezetimibe (ZETIA) 10 MG tablet    Sig: TAKE 1 TABLET EVERY DAY    Dispense:  90 tablet    Refill:  1    Patient Instructions  Medication Instructions:  No Changes *If you need a refill on your cardiac medications before your next appointment, please call your pharmacy*   Lab Work: No Labs If you have labs (blood work) drawn today and your tests are completely normal, you will receive your results only by: [place] (if you have MyChart) OR A paper copy in the mail If you have any lab test that is abnormal or we need to change your treatment, we will call you to review the results.   Testing/Procedures: [address], [address]. Your physician has requested that you have an echocardiogram. Echocardiography is a painless test that uses sound waves to create images of your heart. It provides your doctor with information about the size and shape of your heart and how well your heart's chambers and valves are working. This procedure takes approximately one hour. There are no restrictions for this procedure. Please do NOT wear cologne, perfume, aftershave, or lotions (deodorant is allowed). Please arrive 15 minutes prior to your appointment time.    Follow-Up: At [facility], you and your health needs are our priority.  As part of our continuing mission to provide you with exceptional heart care, we have created designated Provider Care Teams.  These Care Teams include your primary Cardiologist (physician) and  Advanced Practice Providers (APPs -  Physician Assistants and Nurse Practitioners) who all work together to provide you with the care you need, when you need it.  We recommend signing up for the patient portal called "MyChart".  Sign up information is provided on this After Visit Summary.  MyChart is used to connect with patients for Virtual Visits (Telemedicine).  Patients are able to view lab/test results, encounter notes, upcoming appointments, etc.  Non-urgent messages can be sent to your provider as well.   To learn more about what you can do with MyChart, go to [email or web address].    Your next appointment:   6 month(s)  The format for your next appointment:   In Person  Provider:   Shelva Majestic, MD     Signed, [place], [place]  08/09/2022 3:04 PM    [place]

## 2022-08-09 ENCOUNTER — Encounter: Payer: Self-pay | Admitting: Rehabilitative and Restorative Service Providers"

## 2022-08-09 ENCOUNTER — Ambulatory Visit: Payer: Medicare HMO | Attending: Internal Medicine | Admitting: Rehabilitative and Restorative Service Providers"

## 2022-08-09 ENCOUNTER — Encounter: Payer: Self-pay | Admitting: Physician Assistant

## 2022-08-09 ENCOUNTER — Ambulatory Visit: Payer: Medicare HMO | Attending: Physician Assistant | Admitting: Physician Assistant

## 2022-08-09 VITALS — BP 132/72 | HR 63 | Ht 64.0 in | Wt 117.6 lb

## 2022-08-09 DIAGNOSIS — R252 Cramp and spasm: Secondary | ICD-10-CM | POA: Insufficient documentation

## 2022-08-09 DIAGNOSIS — Z9861 Coronary angioplasty status: Secondary | ICD-10-CM

## 2022-08-09 DIAGNOSIS — I10 Essential (primary) hypertension: Secondary | ICD-10-CM | POA: Diagnosis not present

## 2022-08-09 DIAGNOSIS — E7800 Pure hypercholesterolemia, unspecified: Secondary | ICD-10-CM | POA: Diagnosis not present

## 2022-08-09 DIAGNOSIS — M6281 Muscle weakness (generalized): Secondary | ICD-10-CM | POA: Insufficient documentation

## 2022-08-09 DIAGNOSIS — R262 Difficulty in walking, not elsewhere classified: Secondary | ICD-10-CM | POA: Insufficient documentation

## 2022-08-09 DIAGNOSIS — R2689 Other abnormalities of gait and mobility: Secondary | ICD-10-CM | POA: Insufficient documentation

## 2022-08-09 DIAGNOSIS — E785 Hyperlipidemia, unspecified: Secondary | ICD-10-CM

## 2022-08-09 DIAGNOSIS — I358 Other nonrheumatic aortic valve disorders: Secondary | ICD-10-CM | POA: Diagnosis not present

## 2022-08-09 DIAGNOSIS — I2510 Atherosclerotic heart disease of native coronary artery without angina pectoris: Secondary | ICD-10-CM | POA: Diagnosis not present

## 2022-08-09 DIAGNOSIS — Z01810 Encounter for preprocedural cardiovascular examination: Secondary | ICD-10-CM | POA: Diagnosis not present

## 2022-08-09 DIAGNOSIS — M5431 Sciatica, right side: Secondary | ICD-10-CM | POA: Diagnosis not present

## 2022-08-09 MED ORDER — EZETIMIBE 10 MG PO TABS: ORAL_TABLET | ORAL | 1 refills | Status: DC

## 2022-08-09 MED ORDER — ROSUVASTATIN CALCIUM 10 MG PO TABS: ORAL_TABLET | ORAL | 3 refills | Status: DC

## 2022-08-09 MED ORDER — ATENOLOL 25 MG PO TABS: 25.0000 mg | ORAL_TABLET | Freq: Every day | ORAL | 2 refills | Status: DC

## 2022-08-09 MED ORDER — METOPROLOL SUCCINATE ER 25 MG PO TB24: 12.5000 mg | ORAL_TABLET | Freq: Every day | ORAL | 2 refills | Status: DC

## 2022-08-09 NOTE — Patient Instructions (Signed)
Medication Instructions:  No Changes *If you need a refill on your cardiac medications before your next appointment, please call your pharmacy*   Lab Work: No Labs If you have labs (blood work) drawn today and your tests are completely normal, you will receive your results only by: [place] (if you have MyChart) OR A paper copy in the mail If you have any lab test that is abnormal or we need to change your treatment, we will call you to review the results.   Testing/Procedures: [address], [address]. Your physician has requested that you have an echocardiogram. Echocardiography is a painless test that uses sound waves to create images of your heart. It provides your doctor with information about the size and shape of your heart and how well your heart's chambers and valves are working. This procedure takes approximately one hour. There are no restrictions for this procedure. Please do NOT wear cologne, perfume, aftershave, or lotions (deodorant is allowed). Please arrive 15 minutes prior to your appointment time.    Follow-Up: At [facility], you and your health needs are our priority.  As part of our continuing mission to provide you with exceptional heart care, we have created designated Provider Care Teams.  These Care Teams include your primary Cardiologist (physician) and Advanced Practice Providers (APPs -  Physician Assistants and Nurse Practitioners) who all work together to provide you with the care you need, when you need it.  We recommend signing up for the patient portal called "MyChart".  Sign up information is provided on this After Visit Summary.  MyChart is used to connect with patients for Virtual Visits (Telemedicine).  Patients are able to view lab/test results, encounter notes, upcoming appointments, etc.  Non-urgent messages can be sent to your provider as well.   To learn more about what you can do with MyChart, go to  [email or web address].    Your next appointment:   6 month(s)  The format for your next appointment:   In Person  Provider:   Shelva Majestic, MD

## 2022-08-09 NOTE — Therapy (Signed)
OUTPATIENT PHYSICAL THERAPY TREATMENT NOTE AND DISCHARGE SUMMARY   Patient Name: Rachel Nichols MRN: [id number] DOB:[birth year], 88 y.o., female Today's Date: 08/09/2022  PCP: Isaac Bliss, Rayford Halsted, MD REFERRING PROVIDER: Isaac Bliss, Rayford Halsted, MD     END OF SESSION:   PT End of Session - 08/09/22 1014     Visit Number 72    Date for PT Re-Evaluation 08/10/22    Authorization Type [employer]    Progress Note Due on Visit 22    PT Start Time 1012    PT Stop Time 1050    PT Time Calculation (min) 38 min    Activity Tolerance Patient tolerated treatment well    Behavior During Therapy [facility] for tasks assessed/performed              Past Medical History:  Diagnosis Date   Adenomatous colon polyp 2002   Colonoscopy    Benign neoplasm of colon 2004   Colonoscopy   CAD (coronary artery disease)    echo 09/13/10- EF>55%, aortic valve is mildly sclerotic; myoview 02-20-12-no ischemia   Carotid bruit    carotid dopplers 05/16/04- normal   Diverticulosis 2002,2004,2007   Colonoscopy   Duodenitis    Fibrocystic breast disease    Gastritis    GERD (gastroesophageal reflux disease)    Heart attack ([place]) 1993   Cardiolyte stress---  stents 2012   Hiatal hernia    History of blood transfusion    pt denies   Hyperlipidemia    Peptic ulcer due to Helicobacter pylori    PUD (peptic ulcer disease)    SCC (squamous cell carcinoma) 04/24/2011   right jawline (CX35FU), left forearm (CX35FU)   Past Surgical History:  Procedure Laterality Date   [place]     From the nose   CATARACT EXTRACTION  10/26/09   Right eye   CATARACT EXTRACTION  11/09/09   Left eye   CHOLECYSTECTOMY  1984   CORONARY STENT PLACEMENT  01/10/11   hig grade ramus branch stenosis with mod LAD dz, PCI/stenting of ramus branch with a Resolute DES    DILATION AND CURETTAGE OF UTERUS     FINGER SURGERY  04/2011   Cyst removal, right hand  forefinger   LEFT OOPHORECTOMY  1957   SQUAMOUS CELL CARCINOMA EXCISION Right 07/24/2018   Posterior forearm shave & ED&C   Patient Active Problem List   Diagnosis Date Noted   IGT (impaired glucose tolerance) 01/12/2021   Hyperlipidemia LDL goal <70 01/13/2015   Murmur 01/13/2015   RUQ abdominal pain 01/22/2014   Pancreatic cyst 01/22/2014   Essential hypertension 03/06/2013   CAD S/P percutaneous coronary angioplasty 04/14/2012   GASTRITIS 10/19/2009   ABDOMINAL PAIN-EPIGASTRIC 10/18/2009   CHOLECYSTECTOMY, HX OF 10/18/2009   DIVERTICULOSIS, COLON 10/14/2009   COLONIC POLYPS, ADENOMATOUS, HX OF 10/14/2009   HIATAL HERNIA WITH REFLUX 09/19/2009   WEIGHT LOSS 09/19/2009   PUD, HX OF 09/19/2009   BACK PAIN 05/27/2008    REFERRING DIAG: M54.31 (ICD-10-CM) - Right sided sciatica  THERAPY DIAG:  Muscle weakness (generalized)  Other abnormalities of gait and mobility  Cramp and spasm  Difficulty in walking, not elsewhere classified  Rationale for Evaluation and Treatment Rehabilitation  PERTINENT HISTORY: None  PRECAUTIONS: none  SUBJECTIVE: Pt reports that she is having some soreness this morning, but as she is on the NuStep, her pain decreases.  Pt states that she did not  have any relief in symptoms with using dry needling.  PAIN:  Are you having pain? Yes: NPRS scale: 5-8/10 Pain location: back of leg Pain description: radiating Aggravating factors: standing  Relieving factors: rest, meds   OBJECTIVE: (objective measures completed at initial evaluation unless otherwise dated)  OBJECTIVE:    DIAGNOSTIC FINDINGS:  N/A   PATIENT SURVEYS:  04/24/22:  Modified Oswestry 30 / 50 = 60.0 %           06/01/22:  Modified Oswestry Low Back Pain Disability Questionnaire: 28 / 50 = 56.0 %  06/12/2022:  Modified Oswestry Low Back Pain Disability Questionnaire: 26 / 50 = 52.0 % 07/05/2022:  Modified Oswestry Low Back Pain Disability Questionnaire: 20 / 50 = 40.0 % 08/09/2022:   Modified Oswestry Low Back Pain Disability Questionnaire: 15 / 50 = 36.0 %            SENSATION: Reports numbness and tingling down right leg   MUSCLE LENGTH: Right hamstring tightness noted.   POSTURE: rounded shoulders and forward head   PALPATION: Muscle spasms noted in lumbar multifidi   LUMBAR ROM: Limited at least 50% secondary to pain     LOWER EXTREMITY MMT:   Eval:  Right hip 4-/5 grossly throughout 06/12/2022:  R hip 4/5 grossly throughout    LUMBAR SPECIAL TESTS:  Slump test: Positive   FUNCTIONAL TESTS:  04/24/2022:  5 times sit to stand: 16.2 sec pushing up from thighs with 10/10 pain  05/15/2022:   3 min walk test: 189 ft with SPC.  Pt had to sit down at 2.5 minutes and sat for the last 30 sec of the assessment  05/25/2022: 3 min walk test: 284 ft with [facility].   06/01/22: 5 times sit to/from stand:  13.9 sec with UE pushing up from thighs and reports of 7/10 pain TUG: 12.4 sec with [facility]  06/12/2022: 3 min walk test: 438 ft with [facility]  06/28/2022: 3 min walk test: 480 ft with [facility]  08/02/2022: 3 min walk test: 589 ft without AD  08/09/2022: 5 times sit to/from stand:  8.3 sec with UE pushing up from thighs and reports of 6/10 pain TUG: 10.2 sec without assistive device   GAIT: Distance walked: 75 ft Assistive device utilized: None Level of assistance: Complete Independence Comments: Antalgic gait pattern noted with decreased step length       TODAY'S TREATMENT: 08/09/2022: Nustep level 4 (seat at 8, blue machine), x8 min.  PT present to discuss status Objective testing (see above) Seated piriformis stretch 2x20 sec bilat Standing hamstring stretch at step 2x20 sec bilat Manual Therapy:  Addaday with patient in left sidelying to right lumbar paraspinals, glute/piriformis, hamstring Sidelying clamshells 2x10 bilat Supine posterior pelvic tilt 2x10   08/06/2022: Nustep level 4 (seat at 8, blue machine), x8 min.  PT present to discuss status Trigger Point  Dry-Needling  Treatment instructions: Expect mild to moderate muscle soreness. S/S of pneumothorax if dry needled over a lung field, and to seek immediate medical attention should they occur. Patient verbalized understanding of these instructions and education. Patient Consent Given: Yes Education handout provided: Yes Muscles treated: right lumbar multifidi, right glute, right piriformis Electrical stimulation performed: No Parameters: N/A Treatment response/outcome: Utilized skilled palpation to locate/identify trigger points.  Able to illicit muscle twitch response and muscle elongation following Supine lower trunk rotation 5x5 sec bilat Supine piriformis stretch 2x20 sec bilat Standing at step hamstring stretch 2x20 sec bilat 4 way pelvic tilt seated on dynadisc  x30 each Seated with 3#:  heel/toe raises, LAQ, marching, and hip abduction scissors. 2x10 bilat each Leg Press, seat at 6, 50# 2x10   08/02/2022: Nustep level 5 (seat at 8, green machine), x8 min.  PT present to discuss status Seated with 3#:  heel/toe raises, LAQ, marching, and hip abduction scissors. 2x10 bilat each Seated hamstring stretch 2x20 sec each bilat Seated piriformis stretch 2x20 sec each bilat Lying in prone position performing Addaday to bilat lumbar, glutes/piriformis, IT band/TFL, hamstrings, and calves 3 minute walk test x589 ft without assistive device      PATIENT EDUCATION:  Education details: Issued HEP Person educated: Patient Education method: Explanation, Demonstration, and Handouts Education comprehension: verbalized understanding and returned demonstration     HOME EXERCISE PROGRAM: Access Code: [facility] URL: [email or web address] Date: 06/28/2022 Prepared by: Shelby Dubin Korver Graybeal  Exercises - Seated Hamstring Stretch with Chair  - 1 x daily - 7 x weekly - 1 sets - 3 reps - 30 sec hold - Seated Sciatic Tensioner  - 1 x daily - 7 x weekly - 1 sets - 10 reps - 5 sec hold - Seated  Long Arc Quad  - 1 x daily - 7 x weekly - 2 sets - 10 reps - Sit to Stand Without Arm Support  - 1 x daily - 7 x weekly - 2 sets - 5 reps - Seated Hip External Rotation AROM  - 1 x daily - 7 x weekly - 2 sets - 10 reps - Supine Posterior Pelvic Tilt  - 1 x daily - 7 x weekly - 2 sets - 10 reps - Supine March  - 1 x daily - 7 x weekly - 2 sets - 10 reps - Supine Lower Trunk Rotation  - 1 x daily - 7 x weekly - 2 sets - 10 reps - Supine Dead Bug with Leg Extension  - 1 x daily - 7 x weekly - 2 sets - 10 reps    ASSESSMENT:   CLINICAL IMPRESSION: Ms Peaden presents to skilled PT with continued reports of pain and states no significant change with dry needling.  Pt experiencing radiating pain down RLE and limited with pain during treatment.  Overall, pt has made progress and has met all goals except the modified oswestry goal.  Pt continues to report feeling like she is losing weight.  Pt reports that she is able to care for her husband and it does not increase her pain, but she just has pain.  Pt admits to not taking pain medication this morning and could be cause of some of her increased pain during session today.  Given the weight loss and the continued pain, recommend that pt follow back up with MD for further assessment.  Pt is discharged from skilled PT at this time due to her needing further MD work-up.       OBJECTIVE IMPAIRMENTS difficulty walking, decreased strength, increased muscle spasms, and pain.    ACTIVITY LIMITATIONS lifting, standing, and locomotion level   PARTICIPATION LIMITATIONS: cleaning, driving, and shopping   PERSONAL FACTORS Age and 1-2 comorbidities: GERD, Squamous Cell Carcinoma  are also affecting patient's functional outcome.    REHAB POTENTIAL: Good   CLINICAL DECISION MAKING: Evolving/moderate complexity   EVALUATION COMPLEXITY: Moderate     GOALS: Goals reviewed with patient? Yes   SHORT TERM GOALS: Target date: 05/15/2022   Pt will be independent with  initial HEP. Baseline: Goal status: Goal Met 05/23/22   2.  Pt will report  at least a 40% improvement in symptoms. Baseline:  Goal status: Goal Met 06/01/22     LONG TERM GOALS: Target date: 08/09/2022   Pt will be independent with advanced HEP. Baseline:  Goal status: MET on 08/09/2022   2.  Pt will improve modified oswestry to 30% or less to demonstrate improvements with functional mobility. Baseline: 60% Goal status: PARTIALLY MET (36% on 08/09/2022)   3.  Pt will increase right hip strength to at least 4+/5 to improve ability to transfer her husband. Baseline:  Goal status: MET on 08/09/2022   4.  Pt will report no increased pain when caring for her husband, including transferring her husband. Baseline:  Goal status: MET on 08/09/2022     PLAN: PT FREQUENCY: 1-2x/week   PT DURATION: 8 weeks   PLANNED INTERVENTIONS: Therapeutic exercises, Therapeutic activity, Neuromuscular re-education, Balance training, Gait training, Patient/Family education, Self Care, Joint mobilization, Joint manipulation, Stair training, Aquatic Therapy, Dry Needling, Electrical stimulation, Cryotherapy, Moist heat, Taping, Ultrasound, Ionotophoresis 61m/ml Dexamethasone, Manual therapy, and Re-evaluation.   PLAN FOR NEXT SESSION: Discharge completed today, recommend pt following up with MD     PHYSICAL THERAPY DISCHARGE SUMMARY   Patient agrees to discharge. Patient goals were partially met. Patient is being discharged due to a change in medical status.  Given continued weight loss and right LE radiating pain.    SJuel Burrow PT 08/09/22 10:15 AM  [facility] [address] [place] [address]Phone # [phone] [phone]

## 2022-08-10 ENCOUNTER — Encounter: Payer: Self-pay | Admitting: Family Medicine

## 2022-08-10 ENCOUNTER — Ambulatory Visit (INDEPENDENT_AMBULATORY_CARE_PROVIDER_SITE_OTHER): Payer: Medicare HMO | Admitting: Family Medicine

## 2022-08-10 VITALS — BP 118/70 | HR 60 | Ht 64.0 in | Wt 117.0 lb

## 2022-08-10 DIAGNOSIS — M5431 Sciatica, right side: Secondary | ICD-10-CM

## 2022-08-10 DIAGNOSIS — M5416 Radiculopathy, lumbar region: Secondary | ICD-10-CM | POA: Diagnosis not present

## 2022-08-10 MED ORDER — TRAMADOL HCL 50 MG PO TABS: 50.0000 mg | ORAL_TABLET | Freq: Three times a day (TID) | ORAL | 0 refills | Status: AC | PRN

## 2022-08-10 MED ORDER — MELOXICAM 7.5 MG PO TABS: 7.5000 mg | ORAL_TABLET | Freq: Every day | ORAL | 0 refills | Status: DC

## 2022-08-10 NOTE — Progress Notes (Signed)
Established Patient Office Visit  Subjective   Patient ID: Rachel Nichols, female    DOB: 07-11-1931  Age: 88 y.o. MRN: [id number]  Chief Complaint  Patient presents with   Sciatica    X several months, has done 2 exercise programs, was released yesterday. Unsure of what step is next.    Patient states she just completed physical therapy, states that the PT did not help much with her sciatica pain, states that the pain is very dull, like a soreness or a tooth ache. States her pain on average is around a 7/10. Has finished her physical therapy without any resolution of her pain. States her pain gets a little better when she sits down or lays down. Range of motion is "ok". No numbness or tingling, no focal weakness.   I have reviewed her PT notes, her lumbar ROM was decreased by 50% due to pain, her slump test was positive. 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       Review of Systems  All other systems reviewed and are negative.     Objective:     BP 118/70 (BP Location: Right Arm, Patient Position: Sitting, Cuff Size: Normal)   Pulse 60   Ht '5\' 4"'$  (1.626 m)   Wt 117 lb (53.1 kg)   SpO2 97%   BMI 20.08 kg/m    Physical Exam Vitals reviewed.  Constitutional:      Appearance: Normal appearance. She is well-groomed and normal weight.  Cardiovascular:     Rate and Rhythm: Normal rate and regular rhythm.     Pulses: Normal pulses.     Heart sounds: S1 normal and S2 normal.  Pulmonary:     Effort: Pulmonary effort is normal.     Breath sounds: Normal breath sounds and air entry.  Musculoskeletal:     Lumbar back: No deformity, spasms, tenderness or bony tenderness. Decreased range of motion.     Right lower leg: No edema.     Left lower leg: No edema.  Neurological:     Mental Status: She is alert and oriented to person, place, and time. Mental  status is at baseline.     Gait: Gait is intact.  Psychiatric:        Mood and Affect: Mood and affect normal.        Speech: Speech normal.        Behavior: Behavior normal.        Judgment: Judgment normal.      No results found for any visits on 08/10/22.    The ASCVD Risk score (Arnett DK, et al., 2019) failed to calculate for the following reasons:   The 2019 ASCVD risk score is only valid for ages 27 to 53    Assessment & Plan:   Problem List Items Addressed This Visit   None Visit Diagnoses     Right sided sciatica    -  Primary   Relevant Orders   Patient has failed 12 weeks of physical therapy. At this time given her persistent symptoms it would be appropriate to order an MRI of the lumbar spine at this time in order to proceed with referral to a specialist. I have refilled the meloxicam 7.5 mg daily and I gave the patient a small amount of tramadol 50 mg to use as  needed daily for severe pain. Patient reports that she has taken tramadol in the past and did fine with it, she is not sure why it is listed as an allergy.  MR Lumbar Spine Wo Contrast   Lumbar radiculopathy, right       Relevant Orders   MR Lumbar Spine Wo Contrast       No follow-ups on file.    Farrel Conners, MD

## 2022-08-28 ENCOUNTER — Telehealth: Payer: Self-pay | Admitting: Internal Medicine

## 2022-08-28 NOTE — Telephone Encounter (Signed)
Jenna with [facility] called and  - MR LUMBAR SPINE WO CONTRAST needs to be authorized by 12 noon on Wednesday if not they will cancel the appt. Pt has an appt on 09-01-2022 if need to callback ask for taron [phone]

## 2022-09-01 ENCOUNTER — Other Ambulatory Visit: Payer: Medicare HMO

## 2022-09-04 ENCOUNTER — Ambulatory Visit (HOSPITAL_COMMUNITY): Payer: Medicare HMO | Attending: Physician Assistant

## 2022-09-04 DIAGNOSIS — E785 Hyperlipidemia, unspecified: Secondary | ICD-10-CM | POA: Insufficient documentation

## 2022-09-04 DIAGNOSIS — Z01810 Encounter for preprocedural cardiovascular examination: Secondary | ICD-10-CM | POA: Insufficient documentation

## 2022-09-04 DIAGNOSIS — E7800 Pure hypercholesterolemia, unspecified: Secondary | ICD-10-CM | POA: Insufficient documentation

## 2022-09-04 DIAGNOSIS — I10 Essential (primary) hypertension: Secondary | ICD-10-CM | POA: Diagnosis not present

## 2022-09-04 DIAGNOSIS — I358 Other nonrheumatic aortic valve disorders: Secondary | ICD-10-CM | POA: Insufficient documentation

## 2022-09-04 DIAGNOSIS — I2510 Atherosclerotic heart disease of native coronary artery without angina pectoris: Secondary | ICD-10-CM | POA: Insufficient documentation

## 2022-09-04 DIAGNOSIS — Z9861 Coronary angioplasty status: Secondary | ICD-10-CM | POA: Diagnosis not present

## 2022-09-04 DIAGNOSIS — M5431 Sciatica, right side: Secondary | ICD-10-CM | POA: Diagnosis not present

## 2022-09-04 LAB — ECHOCARDIOGRAM COMPLETE
Area-P 1/2: 3.17 cm2
S' Lateral: 2 cm

## 2022-09-07 ENCOUNTER — Ambulatory Visit
Admission: RE | Admit: 2022-09-07 | Discharge: 2022-09-07 | Disposition: A | Discharge status: Home / Self Care | Payer: Medicare HMO | Source: Ambulatory Visit | Attending: Family Medicine | Admitting: Family Medicine

## 2022-09-07 DIAGNOSIS — M545 Low back pain, unspecified: Secondary | ICD-10-CM | POA: Diagnosis not present

## 2022-09-07 DIAGNOSIS — M48061 Spinal stenosis, lumbar region without neurogenic claudication: Secondary | ICD-10-CM | POA: Diagnosis not present

## 2022-09-07 DIAGNOSIS — M5431 Sciatica, right side: Secondary | ICD-10-CM

## 2022-09-07 DIAGNOSIS — M5416 Radiculopathy, lumbar region: Secondary | ICD-10-CM

## 2022-09-10 ENCOUNTER — Other Ambulatory Visit: Payer: Self-pay | Admitting: Family Medicine

## 2022-09-10 DIAGNOSIS — M5431 Sciatica, right side: Secondary | ICD-10-CM

## 2022-09-13 ENCOUNTER — Encounter: Payer: Self-pay | Admitting: *Deleted

## 2022-09-18 DIAGNOSIS — J4489 Other specified chronic obstructive pulmonary disease: Secondary | ICD-10-CM | POA: Diagnosis not present

## 2022-09-19 ENCOUNTER — Telehealth: Payer: Self-pay | Admitting: *Deleted

## 2022-09-19 NOTE — Patient Outreach (Signed)
  Care Coordination   09/19/2022 Name: NHI BUTRUM MRN: [id number] DOB: September 19, 1931   Care Coordination Outreach Attempts:  An unsuccessful telephone outreach was attempted today to offer the patient information about available care coordination services as a benefit of their health plan.   Follow Up Plan:  Additional outreach attempts will be made to offer the patient care coordination information and services.   Encounter Outcome:  No Answer   Care Coordination Interventions:  No, not indicated    Raina Mina, RN Care Management Coordinator [place] Office [phone]

## 2022-11-15 ENCOUNTER — Encounter: Payer: Self-pay | Admitting: Internal Medicine

## 2022-11-20 NOTE — Telephone Encounter (Signed)
error 

## 2023-03-25 DIAGNOSIS — Z20828 Contact with and (suspected) exposure to other viral communicable diseases: Secondary | ICD-10-CM | POA: Diagnosis not present

## 2023-03-25 DIAGNOSIS — Z1159 Encounter for screening for other viral diseases: Secondary | ICD-10-CM | POA: Diagnosis not present

## 2023-04-05 ENCOUNTER — Telehealth: Payer: Self-pay | Admitting: Internal Medicine

## 2023-04-05 NOTE — Telephone Encounter (Signed)
Requesting a FL2 form and a call when completed

## 2023-04-08 ENCOUNTER — Telehealth: Payer: Self-pay | Admitting: Internal Medicine

## 2023-04-08 NOTE — Telephone Encounter (Signed)
Prescription Request  04/08/2023  LOV: 05/07/2022  What is the name of the medication or equipment? atenolol  Have you contacted your pharmacy to request a refill? Yes   Which pharmacy would you like this sent to? [facility] [address] [place] - [address] [place] [address] Phone: [phone] Fax: [phone]   Patient notified that their request is being sent to the clinical staff for review and that they should receive a response within 2 business days.   Please advise at Mobile [phone] (mobile)

## 2023-04-08 NOTE — Telephone Encounter (Signed)
Pt is requesting FL2 form for [facility], [employer], and a blank one for another facility, unsure of the name. Pt is requesting to pick up the forms in person, call back number: [phone]. Pt is aware turn around time could be 5-7 business days.

## 2023-04-10 NOTE — Telephone Encounter (Signed)
Pt notified Rx need to come from [facility]. Pt verbalized understanding. No further action needed!

## 2023-04-10 NOTE — Telephone Encounter (Signed)
FYI

## 2023-04-12 NOTE — Telephone Encounter (Signed)
Spoke to pt and she stated that she does not remember why the forms were needed. Pt stated she will find out and return call.

## 2023-04-15 NOTE — Telephone Encounter (Signed)
Attempted to call the patient, but unable to leave a message. Will try again at a different time.

## 2023-04-16 NOTE — Telephone Encounter (Signed)
Left message on machine for patient to return our call 

## 2023-04-17 NOTE — Telephone Encounter (Signed)
Spoke to the daughter and the [facility] form is for the husband.

## 2023-04-23 DIAGNOSIS — Z20828 Contact with and (suspected) exposure to other viral communicable diseases: Secondary | ICD-10-CM | POA: Diagnosis not present

## 2023-04-23 DIAGNOSIS — Z1159 Encounter for screening for other viral diseases: Secondary | ICD-10-CM | POA: Diagnosis not present

## 2023-04-30 DIAGNOSIS — H5213 Myopia, bilateral: Secondary | ICD-10-CM | POA: Diagnosis not present

## 2023-04-30 DIAGNOSIS — H524 Presbyopia: Secondary | ICD-10-CM | POA: Diagnosis not present

## 2023-06-11 ENCOUNTER — Other Ambulatory Visit: Payer: Self-pay | Admitting: *Deleted

## 2023-06-11 DIAGNOSIS — E7800 Pure hypercholesterolemia, unspecified: Secondary | ICD-10-CM

## 2023-06-11 MED ORDER — EZETIMIBE 10 MG PO TABS: ORAL_TABLET | ORAL | 0 refills | Status: DC

## 2023-07-11 ENCOUNTER — Ambulatory Visit: Payer: Medicare HMO | Admitting: Family Medicine

## 2023-07-11 VITALS — Ht 64.0 in | Wt 117.0 lb

## 2023-07-11 DIAGNOSIS — Z0000 Encounter for general adult medical examination without abnormal findings: Secondary | ICD-10-CM | POA: Diagnosis not present

## 2023-07-11 NOTE — Progress Notes (Signed)
MEDICARE WELLNESS VISIT PATIENT CHECK-IN and HEALTH RISK ASSESSMENT QUESTIONNAIRE:  -completed by phone/video for upcoming Medicare Preventive Visit  Pre-Visit Check-in: 1)Vitals (height, wt, BP, etc) - record in vitals section for visit on day of visit 2)Review and Update Medications, Allergies PMH, Surgeries, Social history in Epic 3)Hospitalizations in the last year with date/reason? no  4)Review and Update Care Team (patient's specialists) in Epic 5) Complete PHQ9 in Epic  6) Complete Fall Screening in Epic 7)Review all Health Maintenance Due and order under PCP if not done.  8)Medicare Wellness Questionnaire: Answer theses question about your habits: Do you drink alcohol? No How many drinks do you have a day?n/a Have you ever smoked?yes Have you stopped smoking and date if applicable? 1977  How many packs a day do you smoke? 2 pack a day. Do you use smokeless tobacco?no Do you use illicit drugs?no Do you exercises? No If so, what type and how many days/minutes per week? [facility]. Havent went. But, not sitting, busy all day.  Are you sexually active? No Number of partners? What did you eat for breakfast today (or yesterday)? Typical breakfast, fried egg, sausage, toast, jelly, a cup of coffee What did you eat for lunch today (or yesterday)?  Typical lunch: usually go out to eat with daughter- spaghetti, fish, cold slaw What did you eat for diner today (or yesterday)? Typical dinner: peanut cracker, daughter cook dinner- mash potato, potato Typical snacks: fritos, potato chips, popsicle.  What beverages do you drink besides water: pepsi  Answer theses question about you: Can you perform most household chores?yes Do you find it hard to follow a conversation in a noisy room?no Do you often ask people to speak up or repeat themselves?no Do you feel that you have a problem with memory?no Do you balance your checkbook and or bank  acounts?yes Do you feel safe at home?yes Last dentist visit?goes every 6 months. Upcoming visit on 08/17/2023. Son in Social worker is dentist.  Do you need assistance with any of the following:  Driving?no  Feeding yourself?no  Getting from bed to chair?no  Getting to the toilet?no  Bathing or showering?no  Dressing yourself?no  Managing money?no  Climbing a flight of stairs?no  Preparing meals?no  Do you have Advanced Directives in place (Living Will, Healthcare Power or Attorney)? Yes.    Last eye Exam and location?had an eye exam a year ago. Previous provider is out of business. In [place] on [address]   Do you currently use prescribed or non-prescribed narcotic or opioid pain medications?no  Do you have a history or close family history of breast, ovarian, tubal or peritoneal cancer or a family member with BRCA 1/2 (breast cancer susceptibility 1 and 2) gene mutations? no  Nurse/Assistant Credentials/time stamp:Antonae Zbikowski M./CMA/3:19pm  Because this visit was a virtual/telehealth visit, some criteria may be missing or patient reported. Any vitals not documented were not able to be obtained and vitals that have been documented are patient reported.    MEDICARE ANNUAL PREVENTIVE VISIT WITH PROVIDER: (Welcome to Medicare, initial annual wellness or annual wellness exam)  Virtual Visit via Phone Note  I connected with Rachel Nichols on 07/11/23 by phone and verified that I am speaking with the correct person using two identifiers.  Location patient: home Location provider:work or home office Persons participating in the virtual visit: patient, provider  Concerns and/or follow up today: no concerns   See HM section in Epic for other  details of completed HM.    ROS: negative for report of fevers, unintentional weight loss, vision changes, vision loss, hearing loss or change, chest pain, sob, hemoptysis, melena, hematochezia, hematuria, falls, bleeding or bruising, thoughts of suicide or  self harm, memory loss  Patient-completed extensive health risk assessment - reviewed and discussed with the patient: See Health Risk Assessment completed with patient prior to the visit either above or in recent phone note. This was reviewed in detailed with the patient today and appropriate recommendations, orders and referrals were placed as needed per Summary below and patient instructions.   Review of Medical History: -PMH, PSH, Family History and current specialty and care providers reviewed and updated and listed below   Patient Care Team: Philip Aspen, Limmie Patricia, MD as PCP - General (Internal Medicine) Lennette Bihari, MD as PCP - Cardiology (Cardiology) Lennette Bihari, MD as Consulting Physician (Cardiology) Oletha Cruel, DDS as Consulting Physician (Dentistry) Jethro Bolus, MD as Consulting Physician (Ophthalmology) Donzetta Starch, MD as Consulting Physician (Dermatology) Marcelino Duster, [place] as Physician Assistant (Cardiology)   Past Medical History:  Diagnosis Date   Adenomatous colon polyp 2002   Colonoscopy    Benign neoplasm of colon 2004   Colonoscopy   CAD (coronary artery disease)    echo 09/13/10- EF>55%, aortic valve is mildly sclerotic; myoview 02-20-12-no ischemia   Carotid bruit    carotid dopplers 05/16/04- normal   Diverticulosis 2002,2004,2007   Colonoscopy   Duodenitis    Fibrocystic breast disease    Gastritis    GERD (gastroesophageal reflux disease)    Heart attack (HCC) 1993   Cardiolyte stress---  stents 2012   Hiatal hernia    History of blood transfusion    pt denies   Hyperlipidemia    Peptic ulcer due to Helicobacter pylori    PUD (peptic ulcer disease)    SCC (squamous cell carcinoma) 04/24/2011   right jawline (CX35FU), left forearm (CX35FU)    Past Surgical History:  Procedure Laterality Date   ANGIOPLASTY  1993   APPENDECTOMY  1957   BASAL CELL CARCINOMA EXCISION     From the nose   CATARACT EXTRACTION  10/26/09    Right eye   CATARACT EXTRACTION  11/09/09   Left eye   CHOLECYSTECTOMY  1984   CORONARY STENT PLACEMENT  01/10/11   hig grade ramus branch stenosis with mod LAD dz, PCI/stenting of ramus branch with a Resolute DES    DILATION AND CURETTAGE OF UTERUS     FINGER SURGERY  04/2011   Cyst removal, right hand forefinger   LEFT OOPHORECTOMY  1957   SQUAMOUS CELL CARCINOMA EXCISION Right 07/24/2018   Posterior forearm shave & ED&C    Social History   Socioeconomic History   Marital status: Married    Spouse name: Not on file   Number of children: 2   Years of education: Not on file   Highest education level: Not on file  Occupational History   Occupation: retired Designer, industrial/product: RETIRED  Tobacco Use   Smoking status: Former    Current packs/day: 0.00    Average packs/day: 2.0 packs/day for 15.0 years (30.0 ttl pk-yrs)    Types: Cigarettes    Start date: 10/08/1960    Quit date: 10/09/1975    Years since quitting: 47.7   Smokeless tobacco: Never  Vaping Use   Vaping status: Never Used  Substance and Sexual Activity   Alcohol use: No  Comment: none   Drug use: No   Sexual activity: Not Currently    Partners: Male  Other Topics Concern   Not on file  Social History Narrative   Exercise--  Gym 3 days a week,   2 days swim   Social Determinants of Health   Financial Resource Strain: Not on file  Food Insecurity: Not on file  Transportation Needs: Not on file  Physical Activity: Not on file  Stress: Not on file  Social Connections: Not on file  Intimate Partner Violence: Not on file    Family History  Problem Relation Age of Onset   Heart disease Brother 63       MI   Heart disease Sister        stents   Neuropathy Sister    Hypertension Sister    Stroke Mother    Heart disease Father    Heart attack Father    Stroke Maternal Grandfather    Heart attack Paternal Grandmother    Heart attack Paternal Grandfather    Breast cancer Maternal Aunt        x  2 aunts   Irritable bowel syndrome Brother    Heart attack Brother    Epilepsy Daughter    Heart attack Paternal Aunt        x 9   Colon cancer Neg Hx     Current Outpatient Medications on File Prior to Visit  Medication Sig Dispense Refill   aspirin 81 MG tablet Take 81 mg by mouth daily.     atenolol (TENORMIN) 25 MG tablet Take 1 tablet (25 mg total) by mouth daily. 90 tablet 2   cholecalciferol (VITAMIN D) 1000 UNITS tablet Take 2,000 Units by mouth daily.     Coenzyme Q10 (CO Q10) 100 MG CAPS Take 200 mg by mouth daily.     Cyanocobalamin (VITAMIN B 12 PO) Take 500 mg by mouth daily.     ezetimibe (ZETIA) 10 MG tablet TAKE 1 TABLET EVERY DAY 90 tablet 0   meloxicam (MOBIC) 7.5 MG tablet Take 1 tablet (7.5 mg total) by mouth daily. 30 tablet 0   metoprolol succinate (TOPROL-XL) 25 MG 24 hr tablet Take 0.5 tablets (12.5 mg total) by mouth daily. 90 tablet 2   Multiple Vitamins-Minerals (CENTRUM SILVER ULTRA WOMENS) TABS Take 1 tablet by mouth daily.     rosuvastatin (CRESTOR) 10 MG tablet TAKE 1 TABLET EVERY DAY. MUST ATTEND UPCOMING APPOINTMENT FOR FUTURE REFILLS 90 tablet 3   saccharomyces boulardii (FLORASTOR) 250 MG capsule Take 1 capsule (250 mg total) by mouth 2 (two) times daily. 180 capsule 1   sucralfate (CARAFATE) 1 GM/10ML suspension Take 10 mLs (1 g total) by mouth 4 (four) times daily -  with meals and at bedtime. 420 mL 0   omeprazole (PRILOSEC) 20 MG capsule Take 1 capsule (20 mg total) by mouth daily. (Patient not taking: Reported on 07/11/2023) 20 capsule 0   Probiotic Product (PROBIOTIC-10 PO) Take by mouth. (Patient not taking: Reported on 08/09/2022)     No current facility-administered medications on file prior to visit.    Allergies  Allergen Reactions   Diltiazem Hcl    Tape Other (See Comments)    It pulls the skin off.   Tramadol Hcl        Physical Exam Vitals requested from patient and listed below if patient had equipment and was able to obtain at  home for this virtual visit: There were no vitals filed  for this visit. Estimated body mass index is 20.08 kg/m as calculated from the following:   Height as of this encounter: 5\' 4"  (1.626 m).   Weight as of this encounter: 117 lb (53.1 kg).  EKG (optional): deferred due to virtual visit  GENERAL: alert, oriented, no acute distress detected, full vision exam deferred due to pandemic and/or virtual encounter  PSYCH/NEURO: pleasant and cooperative, no obvious depression or anxiety, speech and thought processing grossly intact, Cognitive function grossly intact  Flowsheet Row Office Visit from 05/07/2022 in [facility]  PHQ-9 Total Score 1           07/11/2023    3:07 PM 08/10/2022   11:01 AM 05/07/2022    8:47 AM 04/12/2022   11:42 AM 03/30/2022    2:52 PM  Depression screen PHQ 2/9  Decreased Interest 0 0 0 0 0  Down, Depressed, Hopeless 0 0 0 0 0  PHQ - 2 Score 0 0 0 0 0  Altered sleeping   0 0   Tired, decreased energy   1 1   Change in appetite   0 0   Feeling bad or failure about yourself    0 0   Trouble concentrating   0 0   Moving slowly or fidgety/restless   0 0   Suicidal thoughts   0 0   PHQ-9 Score   1 1   Difficult doing work/chores   Not difficult at all Not difficult at all        04/30/2022    6:05 PM 05/02/2022   12:27 PM 05/07/2022    8:48 AM 08/10/2022   11:01 AM 07/11/2023    3:04 PM  Fall Risk  Falls in the past year?   0 0 0  Was there an injury with Fall?   0 0 0  Fall Risk Category Calculator   0 0 0  Fall Risk Category (Retired)   Low Low   (RETIRED) Patient Fall Risk Level Low fall risk Low fall risk Low fall risk Low fall risk   Patient at Risk for Falls Due to   Impaired balance/gait Other (Comment) No Fall Risks  Fall risk Follow up   Falls evaluation completed Falls evaluation completed Falls evaluation completed     SUMMARY AND PLAN:  Encounter for Medicare annual wellness exam   Discussed applicable  health maintenance/preventive health measures and advised and referred or ordered per patient preferences: -discussed recs for updated covid vaccine and that she could get at the pharmacy Health Maintenance  Topic Date Due   COVID-19 Vaccine (4 - 2023-24 season) 07/27/2023 (Originally 06/09/2023)   Medicare Annual Wellness (AWV)  07/10/2024   DTaP/Tdap/Td (3 - Td or Tdap) 02/12/2031   Pneumonia Vaccine 79+ Years old  Completed   INFLUENZA VACCINE  Completed   DEXA SCAN  Completed   Zoster Vaccines- Shingrix  Completed   HPV VACCINES  Aged Out    Education and counseling on the following was provided based on the above review of health and a plan/checklist for the patient, along with additional information discussed, was provided for the patient in the patient instructions :    -Provided counseling and plan for increased risk of falling if applicable per above screening. Reviewed and discussed safe balance exercises that can be done at home to improve balance and discussed exercise guidelines for adults with include balance exercises at least 3 days per week.  -Advised and counseled on a  healthy lifestyle - including the importance of a healthy diet, regular physical activity, social connections and stress management. -Reviewed patient's current diet. Advised and counseled on a whole foods based healthy diet. A summary of a healthy diet was provided in the Patient Instructions.  -reviewed patient's current physical activity level and discussed exercise guidelines for adults. Discussed community resources and ideas for safe exercise at home to assist in meeting exercise guideline recommendations in a safe and healthy way.  -Advise yearly dental visits at minimum and regular eye exams   Follow up: see patient instructions     Patient Instructions  I really enjoyed getting to talk with you today! I am available on Tuesdays and Thursdays for virtual visits if you have any questions or  concerns, or if I can be of any further assistance.   CHECKLIST FROM ANNUAL WELLNESS VISIT:  -Follow up (please call to schedule if not scheduled after visit):   -yearly for annual wellness visit with primary care office  Here is a list of your preventive care/health maintenance measures and the plan for each if any are due:  PLAN For any measures below that may be due:   Health Maintenance  Topic Date Due   COVID-19 Vaccine (4 - 2023-24 season) 07/27/2023 (Originally 06/09/2023)   Medicare Annual Wellness (AWV)  07/10/2024   DTaP/Tdap/Td (3 - Td or Tdap) 02/12/2031   Pneumonia Vaccine 53+ Years old  Completed   INFLUENZA VACCINE  Completed   DEXA SCAN  Completed   Zoster Vaccines- Shingrix  Completed   HPV VACCINES  Aged Out    -See a dentist at least yearly  -Get your eyes checked and then per your eye specialist's recommendations  -Other issues addressed today:   -I have included below further information regarding a healthy whole foods based diet, physical activity guidelines for adults, stress management and opportunities for social connections. I hope you find this information useful.   -----------------------------------------------------------------------------------------------------------------------------------------------------------------------------------------------------------------------------------------------------------  NUTRITION: -eat real food: lots of colorful vegetables (half the plate) and fruits -5-7 servings of vegetables and fruits per day (fresh or steamed is best), exp. 2 servings of vegetables with lunch and dinner and 2 servings of fruit per day. Berries and greens such as kale and collards are great choices.  -consume on a regular basis: whole grains (make sure first ingredient on label contains the word "whole"), fresh fruits, fish, nuts, seeds, healthy oils (such as olive oil, avocado oil, grape seed oil) -may eat small amounts of dairy and  lean meat on occasion, but avoid processed meats such as ham, bacon, lunch meat, etc. -drink water -try to avoid fast food and pre-packaged foods, processed meat -most experts advise limiting sodium to < 2300mg  per day, should limit further is any chronic conditions such as high blood pressure, heart disease, diabetes, etc. The American Heart Association advised that < 1500mg  is is ideal -try to avoid foods that contain any ingredients with names you do not recognize  -try to avoid sugar/sweets (except for the natural sugar that occurs in fresh fruit) -try to avoid sweet drinks -try to avoid white rice, white bread, pasta (unless whole grain), white or yellow potatoes  EXERCISE GUIDELINES FOR ADULTS: -if you wish to increase your physical activity, do so gradually and with the approval of your doctor -STOP and seek medical care immediately if you have any chest pain, chest discomfort or trouble breathing when starting or increasing exercise  -move and stretch your body, legs, feet and arms when  sitting for long periods -Physical activity guidelines for optimal health in adults: -least 150 minutes per week of aerobic exercise (can talk, but not sing) once approved by your doctor, 20-30 minutes of sustained activity or two 10 minute episodes of sustained activity every day.  -resistance training at least 2 days per week if approved by your doctor -balance exercises 3+ days per week:   Stand somewhere where you have something sturdy to hold onto if you lose balance.    1) lift up on toes, start with 5x per day and work up to 20x   2) stand and lift on leg straight out to the side so that foot is a few inches of the floor, start with 5x each side and work up to 20x each side   3) stand on one foot, start with 5 seconds each side and work up to 20 seconds on each side  If you need ideas or help with getting more active:  -Silver sneakers https://tools.silversneakers.com  -Walk with a  Doc: http://www.duncan-williams.com/  -try to include resistance (weight lifting/strength building) and balance exercises twice per week: or the following link for ideas: http://castillo-powell.com/  BuyDucts.dk  STRESS MANAGEMENT: -can try meditating, or just sitting quietly with deep breathing while intentionally relaxing all parts of your body for 5 minutes daily -if you need further help with stress, anxiety or depression please follow up with your primary doctor or contact the wonderful folks at [facility]: [phone]  SOCIAL CONNECTIONS: -options in [place] if you wish to engage in more social and exercise related activities:  -Silver sneakers https://tools.silversneakers.com  -Walk with a Doc: http://www.duncan-williams.com/  -Check out the [facility] Active Adults 50+ section on the [place] of [employer] (hiking clubs, book clubs, cards and games, chess, exercise classes, aquatic classes and much more) - see the website for details: https://www.[place].gov/departments/parks-recreation/active-adults50  -YouTube has lots of exercise videos for different ages and abilities as well  -[facility] (a variety of indoor and outdoor inperson activities for adults). [phone]. [address]  -Virtual Online Classes (a variety of topics): see seniorplanet.org or call ([phone]) [phone]  -consider volunteering at a school, hospice center, church, senior center or elsewhere           Terressa Koyanagi, DO

## 2023-07-11 NOTE — Patient Instructions (Signed)
I really enjoyed getting to talk with you today! I am available on Tuesdays and Thursdays for virtual visits if you have any questions or concerns, or if I can be of any further assistance.   CHECKLIST FROM ANNUAL WELLNESS VISIT:  -Follow up (please call to schedule if not scheduled after visit):   -yearly for annual wellness visit with primary care office  Here is a list of your preventive care/health maintenance measures and the plan for each if any are due:  PLAN For any measures below that may be due:   Health Maintenance  Topic Date Due   COVID-19 Vaccine (4 - 2023-24 season) 07/27/2023 (Originally 06/09/2023)   Medicare Annual Wellness (AWV)  07/10/2024   DTaP/Tdap/Td (3 - Td or Tdap) 02/12/2031   Pneumonia Vaccine 37+ Years old  Completed   INFLUENZA VACCINE  Completed   DEXA SCAN  Completed   Zoster Vaccines- Shingrix  Completed   HPV VACCINES  Aged Out    -See a dentist at least yearly  -Get your eyes checked and then per your eye specialist's recommendations  -Other issues addressed today:   -I have included below further information regarding a healthy whole foods based diet, physical activity guidelines for adults, stress management and opportunities for social connections. I hope you find this information useful.   -----------------------------------------------------------------------------------------------------------------------------------------------------------------------------------------------------------------------------------------------------------  NUTRITION: -eat real food: lots of colorful vegetables (half the plate) and fruits -5-7 servings of vegetables and fruits per day (fresh or steamed is best), exp. 2 servings of vegetables with lunch and dinner and 2 servings of fruit per day. Berries and greens such as kale and collards are great choices.  -consume on a regular basis: whole grains (make sure first ingredient on label contains the word  "whole"), fresh fruits, fish, nuts, seeds, healthy oils (such as olive oil, avocado oil, grape seed oil) -may eat small amounts of dairy and lean meat on occasion, but avoid processed meats such as ham, bacon, lunch meat, etc. -drink water -try to avoid fast food and pre-packaged foods, processed meat -most experts advise limiting sodium to < 2300mg  per day, should limit further is any chronic conditions such as high blood pressure, heart disease, diabetes, etc. The American Heart Association advised that < 1500mg  is is ideal -try to avoid foods that contain any ingredients with names you do not recognize  -try to avoid sugar/sweets (except for the natural sugar that occurs in fresh fruit) -try to avoid sweet drinks -try to avoid white rice, white bread, pasta (unless whole grain), white or yellow potatoes  EXERCISE GUIDELINES FOR ADULTS: -if you wish to increase your physical activity, do so gradually and with the approval of your doctor -STOP and seek medical care immediately if you have any chest pain, chest discomfort or trouble breathing when starting or increasing exercise  -move and stretch your body, legs, feet and arms when sitting for long periods -Physical activity guidelines for optimal health in adults: -least 150 minutes per week of aerobic exercise (can talk, but not sing) once approved by your doctor, 20-30 minutes of sustained activity or two 10 minute episodes of sustained activity every day.  -resistance training at least 2 days per week if approved by your doctor -balance exercises 3+ days per week:   Stand somewhere where you have something sturdy to hold onto if you lose balance.    1) lift up on toes, start with 5x per day and work up to 20x   2) stand and lift on  leg straight out to the side so that foot is a few inches of the floor, start with 5x each side and work up to 20x each side   3) stand on one foot, start with 5 seconds each side and work up to 20 seconds on  each side  If you need ideas or help with getting more active:  -Silver sneakers https://tools.silversneakers.com  -Walk with a Doc: http://www.duncan-williams.com/  -try to include resistance (weight lifting/strength building) and balance exercises twice per week: or the following link for ideas: http://castillo-powell.com/  BuyDucts.dk  STRESS MANAGEMENT: -can try meditating, or just sitting quietly with deep breathing while intentionally relaxing all parts of your body for 5 minutes daily -if you need further help with stress, anxiety or depression please follow up with your primary doctor or contact the wonderful folks at [facility]: [phone]  SOCIAL CONNECTIONS: -options in [place] if you wish to engage in more social and exercise related activities:  -Silver sneakers https://tools.silversneakers.com  -Walk with a Doc: http://www.duncan-williams.com/  -Check out the [facility] Active Adults 50+ section on the [place] of [employer] (hiking clubs, book clubs, cards and games, chess, exercise classes, aquatic classes and much more) - see the website for details: https://www.[place].gov/departments/parks-recreation/active-adults50  -YouTube has lots of exercise videos for different ages and abilities as well  -[facility] (a variety of indoor and outdoor inperson activities for adults). [phone]. [address]  -Virtual Online Classes (a variety of topics): see seniorplanet.org or call 3510775817  -consider volunteering at a school, hospice center, church, senior center or elsewhere

## 2023-07-28 NOTE — Progress Notes (Deleted)
Patient ID: ANI PATENAUDE, female   DOB: Jul 22, 1931, 89 y.o.   MRN: [id number]       HPI: LATARCHA PREAST, is a 89 y.o. female who presents to the office for a 20 month cardiology evaluation.  Ms. Dundee is  suffered a myocardial infarction in January 1993 and underwent PTCA of her RCA. In April 2012, a 2.5x12 mm Resolute DES stent was inserted to the proximal portion of a ramus intermediate vessel. She  had 60% LAD stenosis after diagonal vessel as well as 40-50% mid right coronary artery stenosis for which she's been treated medically. A myocardial perfusion study in May 2013  remained normal. She has continued to remain stable on her current medical regimen. We had increased her Crestor to 10 mg to take in addition to this area there LDL particle concentration was still elevated at 1387. Subsequent blood work on 11/10/2012 showed marked improvement with her LDL now at 61 and LDL particle number at 1027 the total cholesterol was 126, HDL 43, triglycerides 108. Her insulin resistance score was still slightly elevated at 56.  WhenI saw her in April 2016 for 18 month follow-up evaluation, her cardiac murmur was more pronounced.  I scheduled her for an echo Doppler study.  This was done on 06/27/2015.  LV function was normal with an ejection fraction of 65-70%.  There was grade 1 diastolic dysfunction.  There were no wall motion abnormalities.  She had mild left atrial dilatation.  She was not found to have significant valvular pathology.   I saw her in October 2018 at which time she was continuing to do well. Specifically, she denied an any chest pain or palpitations.  She remained active and was swimming several days per week.  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  She was on metoprolol 12.5 mg daily and had reduced to rosuvastatin to only 5 mg but was continued to take Zetia.  She had noticed myalgias on higher dose and was also taking coenzyme Q  10.  When I last saw her, I recommended follow-up laboratory.  Lipid studies in May 2019  showed a total cholesterol of 133, triglycerides 158, HDL 46, and LDL 56.  Her triglycerides had improved from a previous high of 194.  Hemoglobin A1c was 6.4.    I saw her in December 2019 at which time she remained stable and felt well..  Since I saw her, she underwent a follow-up echo Doppler study in June 2020 which showed normal systolic and diastolic function with EF at 60 to 65%.  There was mild aortic sclerosis without stenosis.  She had a telemedicine evaluation with [facility] in follow-up of that echo.   She continues to go to a pool and walks labs.  She no longer swims.  She denies any chest pain or palpitations.  She is sleeping fairly well and does not have any daytime sleepiness although does take an occasional nap.  She is unaware of any anginal type symptoms.  She denies presyncope or syncope.  She has continued to be on Zetia and rosuvastatin 10 mg for hyperlipidemia.  Most recent lipid studies in July 2020 showed a total cholesterol 135 and LDL cholesterol of 57.  She has normal renal function with a creatinine of 0.9.  Hemoglobin A1c in 2019 was in the prediabetic range at 6.4.  She is now followed by Dr. Elizabeth Palau for primary care.  I last saw her on May 12, 2021 at which time she remained active and was without any chest pain or shortness of breath.  Burgess Estelle was her birthday and she turned 89 years old.  She remains fairly active.  She had presented to the emergency room over the past year with complaints of productive cough she continues to be on aspirin 81 mg daily.  She is on Zetia 10 mg and rosuvastatin 10 mg for hyperlipidemia.  She had undergone laboratory with her primary physician Dr. Fayne Mediate in April 2022.  Hemoglobin hematocrit were stable at 13.9 and 41.4, respectively.  TSH was 2.71.  Lipid studies were excellent with total cholesterol 131 LDL cholesterol 56  triglycerides 131 and HDL 48.6.  She is prediabetic and hemoglobin A1c was 6.4.   She was evaluated by Micah Flesher, on August 09, 2022.     Past Medical History:  Diagnosis Date   Adenomatous colon polyp 2002   Colonoscopy    Benign neoplasm of colon 2004   Colonoscopy   CAD (coronary artery disease)    echo 09/13/10- EF>55%, aortic valve is mildly sclerotic; myoview 02-20-12-no ischemia   Carotid bruit    carotid dopplers 05/16/04- normal   Diverticulosis 2002,2004,2007   Colonoscopy   Duodenitis    Fibrocystic breast disease    Gastritis    GERD (gastroesophageal reflux disease)    Heart attack (HCC) 1993   Cardiolyte stress---  stents 2012   Hiatal hernia    History of blood transfusion    pt denies   Hyperlipidemia    Peptic ulcer due to Helicobacter pylori    PUD (peptic ulcer disease)    SCC (squamous cell carcinoma) 04/24/2011   right jawline (CX35FU), left forearm (CX35FU)    Past Surgical History:  Procedure Laterality Date   ANGIOPLASTY  1993   APPENDECTOMY  1957   BASAL CELL CARCINOMA EXCISION     From the nose   CATARACT EXTRACTION  10/26/09   Right eye   CATARACT EXTRACTION  11/09/09   Left eye   CHOLECYSTECTOMY  1984   CORONARY STENT PLACEMENT  01/10/11   hig grade ramus branch stenosis with mod LAD dz, PCI/stenting of ramus branch with a Resolute DES    DILATION AND CURETTAGE OF UTERUS     FINGER SURGERY  04/2011   Cyst removal, right hand forefinger   LEFT OOPHORECTOMY  1957   SQUAMOUS CELL CARCINOMA EXCISION Right 07/24/2018   Posterior forearm shave & ED&C    Allergies  Allergen Reactions   Diltiazem Hcl    Tape Other (See Comments)    It pulls the skin off.   Tramadol Hcl     Current Outpatient Medications  Medication Sig Dispense Refill   aspirin 81 MG tablet Take 81 mg by mouth daily.     atenolol (TENORMIN) 25 MG tablet Take 1 tablet (25 mg total) by mouth daily. 90 tablet 2   cholecalciferol (VITAMIN D) 1000 UNITS tablet Take 2,000  Units by mouth daily.     Coenzyme Q10 (CO Q10) 100 MG CAPS Take 200 mg by mouth daily.     Cyanocobalamin (VITAMIN B 12 PO) Take 500 mg by mouth daily.     ezetimibe (ZETIA) 10 MG tablet TAKE 1 TABLET EVERY DAY 90 tablet 0   meloxicam (MOBIC) 7.5 MG tablet Take 1 tablet (7.5 mg total) by mouth daily. 30 tablet 0   metoprolol succinate (TOPROL-XL) 25 MG 24 hr tablet Take 0.5 tablets (12.5  mg total) by mouth daily. 90 tablet 2   Multiple Vitamins-Minerals (CENTRUM SILVER ULTRA WOMENS) TABS Take 1 tablet by mouth daily.     omeprazole (PRILOSEC) 20 MG capsule Take 1 capsule (20 mg total) by mouth daily. (Patient not taking: Reported on 07/11/2023) 20 capsule 0   Probiotic Product (PROBIOTIC-10 PO) Take by mouth. (Patient not taking: Reported on 08/09/2022)     rosuvastatin (CRESTOR) 10 MG tablet TAKE 1 TABLET EVERY DAY. MUST ATTEND UPCOMING APPOINTMENT FOR FUTURE REFILLS 90 tablet 3   saccharomyces boulardii (FLORASTOR) 250 MG capsule Take 1 capsule (250 mg total) by mouth 2 (two) times daily. 180 capsule 1   sucralfate (CARAFATE) 1 GM/10ML suspension Take 10 mLs (1 g total) by mouth 4 (four) times daily -  with meals and at bedtime. 420 mL 0   No current facility-administered medications for this visit.   Socially she is married. Has one child who is living and one child who is deceased. She has 3 grandchildren. There is no tobacco or alcohol use.  Her father died at age 104, her mother died at age 74.  She has a sister who is now 24 years old.  Her brother died at age 47.  ROS General: Negative; No fevers, chills, or night sweats HEENT: Negative; No changes in vision or hearing, sinus congestion, difficulty swallowing Pulmonary: Negative; No cough, wheezing, shortness of breath, hemoptysis Cardiovascular: See HPI GI: Negative; No nausea, vomiting, diarrhea, or abdominal pain GU: Negative; No dysuria, hematuria, or difficulty voiding Musculoskeletal: Intermittent cramps in her legs and  feet. Hematologic/Oncology: Negative; no easy bruising, bleeding Endocrine: Negative; no heat/cold intolerance; no diabetes Neuro: Negative; no changes in balance, headaches Skin: Negative; No rashes or skin lesions Psychiatric: Negative; No behavioral problems, depression Sleep: Negative; No snoring, daytime sleepiness, hypersomnolence, bruxism, restless legs, hypnogognic hallucinations, no cataplexy Other comprehensive 14 point system review is negative.   PE There were no vitals taken for this visit.   Repeat blood pressure by me was 114/62  Wt Readings from Last 3 Encounters:  07/11/23 117 lb (53.1 kg)  08/10/22 117 lb (53.1 kg)  08/09/22 117 lb 9.6 oz (53.3 kg)   General: Alert, oriented, no distress.  Skin: normal turgor, no rashes, warm and dry HEENT: Normocephalic, atraumatic. Pupils equal round and reactive to light; sclera anicteric; extraocular muscles intact;  Nose without nasal septal hypertrophy Mouth/Parynx benign; Mallinpatti scale 3 Neck: No JVD, no carotid bruits; normal carotid upstroke Lungs: clear to ausculatation and percussion; no wheezing or rales Chest wall: without tenderness to palpitation Heart: PMI not displaced, RRR, s1 s2 normal, 2/6 systolic murmur, no diastolic murmur, no rubs, gallops, thrills, or heaves Abdomen: soft, nontender; no hepatosplenomehaly, BS+; abdominal aorta nontender and not dilated by palpation. Back: no CVA tenderness Pulses 2+ Musculoskeletal: full range of motion, normal strength, no joint deformities Extremities: no clubbing cyanosis or edema, Homan's sign negative  Neurologic: grossly nonfocal; Cranial nerves grossly wnl Psychologic: Normal mood and affect  May 12, 2021 ECG (independently read by me):  NSR at 60, mild sinus arrhythmia , normal intervals  December 2020 ECG (independently read by me): Sinus bradycardia 59 bpm.  No ST segment changes.  No ectopy.  Normal intervals.  Mild left axis deviation.  December  2019 ECG (independently read by me): Normal sinus rhythm at 68 bpm.  No ectopy.  Normal intervals.  October 2018 ECG (independently read by me): Normal sinus rhythm at 74 bpm.  Left axis deviation.  QTc interval  472 ms.  October 2017 ECG (independently read by me): Sinus bradycardia 56 bpm.  Normal intervals.  No ectopy.  October 2016 ECG (independently read by me): Sinus bradycardia at 55 bpm.  No ectopy.  Normal intervals.  April 2016 ECG (independently read by me): Sinus bradycardia 59 bpm.  No ectopy.  Pior ECG: Sinus rhythm at 53 beats per minute. PR interval 166 ms QTc interval 426 ms.  LABS     Latest Ref Rng & Units 05/02/2022    4:16 PM 04/30/2022    7:06 PM 01/12/2021    9:07 AM  BMP  Glucose 70 - 99 mg/dL 161  096  99   BUN 8 - 23 mg/dL 24  26  15    Creatinine 0.44 - 1.00 mg/dL 0.45  4.09  8.11   Sodium 135 - 145 mmol/L 136  136  141   Potassium 3.5 - 5.1 mmol/L 4.3  4.3  4.4   Chloride 98 - 111 mmol/L 100  100  106   CO2 22 - 32 mmol/L 28  27  30    Calcium 8.9 - 10.3 mg/dL 91.4  78.2  9.8       Component Value Date/Time   PROT 6.6 05/02/2022 1616   PROT 7.1 04/09/2019 1026   ALBUMIN 4.2 05/02/2022 1616   ALBUMIN 4.7 (H) 04/09/2019 1026   AST 34 05/02/2022 1616   ALT 54 (H) 05/02/2022 1616   ALKPHOS 33 (L) 05/02/2022 1616   BILITOT 0.7 05/02/2022 1616   BILITOT 0.5 04/09/2019 1026   BILIDIR 0.1 01/11/2014 1507       Latest Ref Rng & Units 05/02/2022    4:16 PM 04/30/2022    7:06 PM 01/12/2021    9:07 AM  CBC  WBC 4.0 - 10.5 K/uL 7.3  10.0  5.2   Hemoglobin 12.0 - 15.0 g/dL 95.6  21.3  08.6   Hematocrit 36.0 - 46.0 % 40.8  41.2  41.4   Platelets 150 - 400 K/uL 188  210  143.0    Lab Results  Component Value Date   MCV 94.9 05/02/2022   MCV 94.5 04/30/2022   MCV 93.8 01/12/2021   Lab Results  Component Value Date   TSH 2.71 01/12/2021   Lab Results  Component Value Date   HGBA1C 6.4 01/12/2021   Lipid Panel     Component Value Date/Time   CHOL  131 01/12/2021 0907   CHOL 135 04/09/2019 1026   TRIG 131.0 01/12/2021 0907   HDL 48.60 01/12/2021 0907   HDL 48 04/09/2019 1026   CHOLHDL 3 01/12/2021 0907   VLDL 26.2 01/12/2021 0907   LDLCALC 56 01/12/2021 0907   LDLCALC 57 04/09/2019 1026     BNP    Component Value Date/Time   PROBNP 86.0 01/10/2011 1357    Lipid Panel     Component Value Date/Time   CHOL 131 01/12/2021 0907   CHOL 135 04/09/2019 1026   TRIG 131.0 01/12/2021 0907   HDL 48.60 01/12/2021 0907   HDL 48 04/09/2019 1026   CHOLHDL 3 01/12/2021 0907   VLDL 26.2 01/12/2021 0907   LDLCALC 56 01/12/2021 0907   LDLCALC 57 04/09/2019 1026     RADIOLOGY: No results found.  IMPRESSION: No diagnosis found.   ASSESSMENT AND PLAN: Ms. Safrit is a young appearing 89 year old Caucasian female who is 29 years status post her initial myocardial infarction which time she underwent PTCA of RCA in 1993.  In 2012 she underwent successful DES  stenting of the ramus intermediate vessel and had concomitant 60% LAD stenosis after diagonal vessel and 40-50% mid RCA stenoses/.  Over these many years, she has remained stable and has been free of recurrent anginal symptomatology.  She has been aggressively treated with lipid management and LDL lipid studies consistently in the 50s and most recently 56 on January 12, 2021 on her combination therapy with rosuvastatin and Zetia.  Her cardiac murmur is consistent with aortic valve sclerosis.  There is no stenosis noted on her echo study.  Her blood pressure today is excellent and a repeat by me was 114/62.  She continues to be active and remains asymptomatic.  She has a prescription for metoprolol to take 12.5 mg as needed.  She has not required use of this recently.  Most recent laboratory is stable.  I have recommended she continue current therapy.  I will see her in 1 year for reevaluation or sooner as needed.   Lennette Bihari, MD, [facility]  07/28/2023 6:17 PM

## 2023-07-29 ENCOUNTER — Ambulatory Visit: Payer: Medicare HMO | Attending: Cardiovascular Disease | Admitting: Cardiovascular Disease

## 2023-08-07 DIAGNOSIS — I10 Essential (primary) hypertension: Secondary | ICD-10-CM | POA: Diagnosis not present

## 2023-08-07 DIAGNOSIS — E119 Type 2 diabetes mellitus without complications: Secondary | ICD-10-CM | POA: Diagnosis not present

## 2023-08-07 DIAGNOSIS — R7989 Other specified abnormal findings of blood chemistry: Secondary | ICD-10-CM | POA: Diagnosis not present

## 2023-08-15 ENCOUNTER — Encounter: Payer: Self-pay | Admitting: Cardiovascular Disease

## 2023-08-15 ENCOUNTER — Ambulatory Visit: Payer: Medicare HMO | Attending: Cardiovascular Disease | Admitting: Cardiovascular Disease

## 2023-08-15 VITALS — BP 120/60 | HR 56 | Ht 64.0 in | Wt 127.0 lb

## 2023-08-15 DIAGNOSIS — I358 Other nonrheumatic aortic valve disorders: Secondary | ICD-10-CM | POA: Diagnosis not present

## 2023-08-15 DIAGNOSIS — I10 Essential (primary) hypertension: Secondary | ICD-10-CM | POA: Diagnosis not present

## 2023-08-15 DIAGNOSIS — I252 Old myocardial infarction: Secondary | ICD-10-CM | POA: Diagnosis not present

## 2023-08-15 DIAGNOSIS — E785 Hyperlipidemia, unspecified: Secondary | ICD-10-CM

## 2023-08-15 DIAGNOSIS — Z9861 Coronary angioplasty status: Secondary | ICD-10-CM

## 2023-08-15 DIAGNOSIS — I2510 Atherosclerotic heart disease of native coronary artery without angina pectoris: Secondary | ICD-10-CM | POA: Diagnosis not present

## 2023-08-15 MED ORDER — METOPROLOL SUCCINATE ER 25 MG PO TB24: 25.0000 mg | ORAL_TABLET | Freq: Every day | ORAL | 3 refills | Status: DC

## 2023-08-15 NOTE — Progress Notes (Signed)
Will 24 patient ID: Rachel Nichols, female   DOB: 03-24-1931, 89 y.o.   MRN: [id number]        HPI: Rachel Nichols, is a 89 y.o. female who presents to the office for a 73 month cardiology evaluation.  Rachel Nichols suffered a myocardial infarction in January 1993 and underwent PTCA of her RCA. In April 2012, a 2.5x12 mm Resolute DES stent was inserted to the proximal portion of a ramus intermediate vessel. She  had 60% LAD stenosis after diagonal vessel as well as 40-50% mid right coronary artery stenosis for which she's been treated medically. A myocardial perfusion study in May 2013  remained normal. She has continued to remain stable on her current medical regimen. We had increased her Crestor to 10 mg to take in addition to this area there LDL particle concentration was still elevated at 1387. Subsequent blood work on 11/10/2012 showed marked improvement with her LDL now at 61 and LDL particle number at 1027 the total cholesterol was 126, HDL 43, triglycerides 108. Her insulin resistance score was still slightly elevated at 56.  When I saw her in April 2016 for 18 month follow-up evaluation, her cardiac murmur was more pronounced.  I scheduled her for an echo Doppler study.  This was done on 06/27/2015.  LV function was normal with an ejection fraction of 65-70%.  There was grade 1 diastolic dysfunction.  There were no wall motion abnormalities.  She had mild left atrial dilatation.  She was not found to have significant valvular pathology.   I saw her in October 2018 at which time she was continuing to do well. Specifically, she denied an any chest pain or palpitations.  She remained active and was swimming several days per week.  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  She was on metoprolol 12.5 mg daily and had reduced to rosuvastatin to only 5 mg but was continued to take Zetia.  She had noticed myalgias on higher dose and was also taking  coenzyme Q 10.  When I last saw her, I recommended follow-up laboratory.  Lipid studies in May 2019  showed a total cholesterol of 133, triglycerides 158, HDL 46, and LDL 56.  Her triglycerides had improved from a previous high of 194.  Hemoglobin A1c was 6.4.    I saw her in December 2019 at which time she remained stable and felt well..  Since I saw her, she underwent a follow-up echo Doppler study in June 2020 which showed normal systolic and diastolic function with EF at 60 to 65%.  There was mild aortic sclerosis without stenosis.  She had a telemedicine evaluation with Rachel Nichols in follow-up of that echo.   She continues to go to a pool and walks labs.  She no longer swims.  She denies any chest pain or palpitations.  She is sleeping fairly well and does not have any daytime sleepiness although does take an occasional nap.  She is unaware of any anginal type symptoms.  She denies presyncope or syncope.  She has continued to be on Zetia and rosuvastatin 10 mg for hyperlipidemia.  Most recent lipid studies in July 2020 showed a total cholesterol 135 and LDL cholesterol of 57.  She has normal renal function with a creatinine of 0.9.  Hemoglobin A1c in 2019 was in the prediabetic range at 6.4.  She is now followed by Rachel Nichols for primary care.  I last saw her on May 12, 2021. She remained active and denied any chest pain or shortness of breath.  Rachel Nichols was her birthday and she turned 89 years old.  She remains fairly active.  She had presented to the emergency room over the past year with complaints of productive cough she continues to be on aspirin 81 mg daily.  She is on Zetia 10 mg and rosuvastatin 10 mg for hyperlipidemia.  She had undergone laboratory with her primary physician Rachel Nichols in April 2022.  Hemoglobin hematocrit were stable at 13.9 and 41.4, respectively.  TSH was 2.71.  Lipid studies were excellent with total cholesterol 131 LDL cholesterol 56  triglycerides 131 and HDL 48.6.  She is prediabetic and hemoglobin A1c was 6.4.    She was seen by Rachel Nichols for her last evaluation on August 09, 2022.  She needed refills on her medications.  She remained active and was taking care of her house and was walking the [place] of a grocery store and still carrying groceries to the car.  She denied any recurrent chest pain or change in symptomatology.  Presently, Rachel Nichols continues to feel well.  Her husband is now in a nursing home.  It appears that she has continued to be on atenolol 25 mg and also is taking metoprolol succinate 12.5 mg daily.  She is on rosuvastatin 10 mg daily as well as Zetia 10 mg.  There she takes Prilosec for GERD.  She is unaware of palpitations chest pain or shortness of breath.  She presents for evaluation.  Past Medical History:  Diagnosis Date   Adenomatous colon polyp 2002   Colonoscopy    Benign neoplasm of colon 2004   Colonoscopy   CAD (coronary artery disease)    echo 09/13/10- EF>55%, aortic valve is mildly sclerotic; myoview 02-20-12-no ischemia   Carotid bruit    carotid dopplers 05/16/04- normal   Diverticulosis 2002,2004,2007   Colonoscopy   Duodenitis    Fibrocystic breast disease    Gastritis    GERD (gastroesophageal reflux disease)    Heart attack (HCC) 1993   Cardiolyte stress---  stents 2012   Hiatal hernia    History of blood transfusion    pt denies   Hyperlipidemia    Peptic ulcer due to Helicobacter pylori    PUD (peptic ulcer disease)    SCC (squamous cell carcinoma) 04/24/2011   right jawline (CX35FU), left forearm (CX35FU)    Past Surgical History:  Procedure Laterality Date   ANGIOPLASTY  1993   APPENDECTOMY  1957   BASAL CELL CARCINOMA EXCISION     From the nose   CATARACT EXTRACTION  10/26/09   Right eye   CATARACT EXTRACTION  11/09/09   Left eye   CHOLECYSTECTOMY  1984   CORONARY STENT PLACEMENT  01/10/11   hig grade ramus branch stenosis with mod LAD dz, PCI/stenting of ramus  branch with a Resolute DES    DILATION AND CURETTAGE OF UTERUS     FINGER SURGERY  04/2011   Cyst removal, right hand forefinger   LEFT OOPHORECTOMY  1957   SQUAMOUS CELL CARCINOMA EXCISION Right 07/24/2018   Posterior forearm shave & ED&C    Allergies  Allergen Reactions   Diltiazem Hcl    Tape Other (See Comments)    It pulls the skin off.   Tramadol Hcl     Current Outpatient Medications  Medication Sig Dispense Refill   aspirin 81 MG tablet Take 81 mg  by mouth daily.     cholecalciferol (VITAMIN D) 1000 UNITS tablet Take 2,000 Units by mouth daily.     Coenzyme Q10 (CO Q10) 100 MG CAPS Take 200 mg by mouth daily.     Cyanocobalamin (VITAMIN B 12 PO) Take 500 mg by mouth daily.     ezetimibe (ZETIA) 10 MG tablet TAKE 1 TABLET EVERY DAY 90 tablet 0   meloxicam (MOBIC) 7.5 MG tablet Take 1 tablet (7.5 mg total) by mouth daily. 30 tablet 0   metoprolol succinate (TOPROL XL) 25 MG 24 hr tablet Take 1 tablet (25 mg total) by mouth daily. 90 tablet 3   Multiple Vitamins-Minerals (CENTRUM SILVER ULTRA WOMENS) TABS Take 1 tablet by mouth daily.     omeprazole (PRILOSEC) 20 MG capsule Take 1 capsule (20 mg total) by mouth daily. 20 capsule 0   Probiotic Product (PROBIOTIC-10 PO) Take by mouth.     rosuvastatin (CRESTOR) 10 MG tablet TAKE 1 TABLET EVERY DAY. MUST ATTEND UPCOMING APPOINTMENT FOR FUTURE REFILLS 90 tablet 3   saccharomyces boulardii (FLORASTOR) 250 MG capsule Take 1 capsule (250 mg total) by mouth 2 (two) times daily. 180 capsule 1   sucralfate (CARAFATE) 1 GM/10ML suspension Take 10 mLs (1 g total) by mouth 4 (four) times daily -  with meals and at bedtime. 420 mL 0   No current facility-administered medications for this visit.   Socially she is married. Has one child who is living and one child who is deceased. She has 3 grandchildren. There is no tobacco or alcohol use.  Her father died at age 68, her mother died at age 54.  She has a sister who is now 30 years old.  Her  brother died at age 59.  ROS General: Negative; No fevers, chills, or night sweats HEENT: Negative; No changes in vision or hearing, sinus congestion, difficulty swallowing Pulmonary: Negative; No cough, wheezing, shortness of breath, hemoptysis Cardiovascular: See HPI GI: Negative; No nausea, vomiting, diarrhea, or abdominal pain GU: Negative; No dysuria, hematuria, or difficulty voiding Musculoskeletal: Intermittent cramps in her legs and feet. Hematologic/Oncology: Negative; no easy bruising, bleeding Endocrine: Negative; no heat/cold intolerance; no diabetes Neuro: Negative; no changes in balance, headaches Skin: Negative; No rashes or skin lesions Psychiatric: Negative; No behavioral problems, depression Sleep: Negative; No snoring, daytime sleepiness, hypersomnolence, bruxism, restless legs, hypnogognic hallucinations, no cataplexy Other comprehensive 14 point system review is negative.   PE BP 120/60   Pulse (!) 56   Ht 5\' 4"  (1.626 m)   Wt 127 lb (57.6 kg)   SpO2 96%   BMI 21.80 kg/m    Repeat blood pressure by me was 116/60  Wt Readings from Last 3 Encounters:  08/15/23 127 lb (57.6 kg)  07/11/23 117 lb (53.1 kg)  08/10/22 117 lb (53.1 kg)   General: Alert, oriented, no distress.  Skin: normal turgor, no rashes, warm and dry HEENT: Normocephalic, atraumatic. Pupils equal round and reactive to light; sclera anicteric; extraocular muscles intact;  Nose without nasal septal hypertrophy Mouth/Parynx benign; Mallinpatti scale 3 Neck: No JVD, no carotid bruits; normal carotid upstroke Lungs: clear to ausculatation and percussion; no wheezing or rales Chest wall: without tenderness to palpitation Heart: PMI not displaced, RRR, s1 s2 normal, 2/6 systolic murmur, no diastolic murmur, no rubs, gallops, thrills, or heaves Abdomen: soft, nontender; no hepatosplenomehaly, BS+; abdominal aorta nontender and not dilated by palpation. Back: no CVA tenderness Pulses  2+ Musculoskeletal: full range of motion, normal strength, no joint  deformities Extremities: no clubbing cyanosis or edema, Homan's sign negative  Neurologic: grossly nonfocal; Cranial nerves grossly wnl Psychologic: Normal mood and affect   EKG Interpretation Date/Time:  Thursday August 15 2023 10:28:37 EST Ventricular Rate:  56 PR Interval:  180 QRS Duration:  82 QT Interval:  454 QTC Calculation: 438 R Axis:   -39  Text Interpretation: Sinus bradycardia Left axis deviation When compared with ECG of 30-Apr-2022 18:10, No significant change was found Confirmed by Nicki Guadalajara ([id number]) on 08/15/2023 11:16:50 AM    May 12, 2021 ECG (independently read by me):  NSR at 60, mild sinus arrhythmia , normal intervals  December 2020 ECG (independently read by me): Sinus bradycardia 59 bpm.  No ST segment changes.  No ectopy.  Normal intervals.  Mild left axis deviation.  December 2019 ECG (independently read by me): Normal sinus rhythm at 68 bpm.  No ectopy.  Normal intervals.  October 2018 ECG (independently read by me): Normal sinus rhythm at 74 bpm.  Left axis deviation.  QTc interval 472 ms.  October 2017 ECG (independently read by me): Sinus bradycardia 56 bpm.  Normal intervals.  No ectopy.  October 2016 ECG (independently read by me): Sinus bradycardia at 55 bpm.  No ectopy.  Normal intervals.  April 2016 ECG (independently read by me): Sinus bradycardia 59 bpm.  No ectopy.  Pior ECG: Sinus rhythm at 53 beats per minute. PR interval 166 ms QTc interval 426 ms.  LABS     Latest Ref Rng & Units 08/21/2023    8:15 AM 05/02/2022    4:16 PM 04/30/2022    7:06 PM  BMP  Glucose 70 - 99 mg/dL 604  540  981   BUN 10 - 36 mg/dL 17  24  26    Creatinine 0.57 - 1.00 mg/dL 1.91  4.78  2.95   BUN/Creat Ratio 12 - 28 20     Sodium 134 - 144 mmol/L 142  136  136   Potassium 3.5 - 5.2 mmol/L 4.9  4.3  4.3   Chloride 96 - 106 mmol/L 105  100  100   CO2 20 - 29 mmol/L 26  28  27     Calcium 8.7 - 10.3 mg/dL 62.1  30.8  65.7       Component Value Date/Time   PROT 7.1 08/21/2023 0815   ALBUMIN 4.5 08/21/2023 0815   AST 20 08/21/2023 0815   ALT 18 08/21/2023 0815   ALKPHOS 54 08/21/2023 0815   BILITOT 0.4 08/21/2023 0815   BILIDIR 0.1 01/11/2014 1507       Latest Ref Rng & Units 08/21/2023    8:15 AM 05/02/2022    4:16 PM 04/30/2022    7:06 PM  CBC  WBC 3.4 - 10.8 x10E3/uL 5.6  7.3  10.0   Hemoglobin 11.1 - 15.9 g/dL 84.6  96.2  95.2   Hematocrit 34.0 - 46.6 % 43.9  40.8  41.2   Platelets 150 - 450 x10E3/uL 148  188  210    Lab Results  Component Value Date   MCV 97 08/21/2023   MCV 94.9 05/02/2022   MCV 94.5 04/30/2022   Lab Results  Component Value Date   TSH 3.610 08/21/2023   Lab Results  Component Value Date   HGBA1C 6.4 01/12/2021   Lipid Panel     Component Value Date/Time   CHOL 165 08/21/2023 0815   TRIG 119 08/21/2023 0815   HDL 57 08/21/2023 0815   CHOLHDL 2.9 08/21/2023  0815   CHOLHDL 3 01/12/2021 0907   VLDL 26.2 01/12/2021 0907   LDLCALC 87 08/21/2023 0815     BNP    Component Value Date/Time   PROBNP 86.0 01/10/2011 1357    Lipid Panel     Component Value Date/Time   CHOL 165 08/21/2023 0815   TRIG 119 08/21/2023 0815   HDL 57 08/21/2023 0815   CHOLHDL 2.9 08/21/2023 0815   CHOLHDL 3 01/12/2021 0907   VLDL 26.2 01/12/2021 0907   LDLCALC 87 08/21/2023 0815     RADIOLOGY: No results found.  IMPRESSION:  1. Primary hypertension   2. Old MI (myocardial infarction): 1993, PTCA RCA   3. CAD S/P percutaneous coronary angioplasty: DES stent Ramus, 2012   4. Aortic valve sclerosis   5. Hyperlipidemia with target LDL less than 70    ASSESSMENT AND PLAN: Rachel Nichols is a young appearing 89 year old Caucasian female who is 31 years status post her initial myocardial infarction which time she underwent PTCA of RCA in 1993.  In 2012 she underwent successful DES stenting of the ramus intermediate vessel and had  concomitant 60% LAD stenosis after diagonal vessel and 40-50% mid RCA stenoses/.  Over these many years, she has remained stable and has been free of recurrent anginal symptomatology.  She has been aggressively treated with lipid management and LDL lipid studies consistently in the 50s and most recently 56 on January 12, 2021 on her combination therapy with rosuvastatin and Zetia.  Her cardiac murmur is consistent with aortic valve sclerosis.  There is no stenosis noted on her echo study.  Presently, her blood pressure is stable.  She is bradycardic with a pulse of 56, but apparently has been taking both atenolol 50 mg daily in addition to metoprolol succinate 12.5 mg.  I have recommended she discontinue atenolol and will change her metoprolol succinate to 25 mg daily.  I have recommended follow-up laboratory with a comprehensive metabolic panel, CBC, TSH, and fasting lipid studies.  She will be undergoing a follow-up echo Doppler study for reassessment of her LV function and aortic valve.  I will see her in 6 months for follow-up evaluation or sooner as needed.   Lennette Bihari, MD, [facility]  08/24/2023 10:40 AM

## 2023-08-15 NOTE — Patient Instructions (Addendum)
Medication Instructions:  STOP taking Atenolol START taking Metoprolol Succinate 25 MG by mouth daily.  *If you need a refill on your cardiac medications before your next appointment, please call your pharmacy*  Lab Work: CBC, TSH, Lipid, CMET  Testing/Procedures: None Ordered  Follow-Up: At [facility], you and your health needs are our priority.  As part of our continuing mission to provide you with exceptional heart care, we have created designated Provider Care Teams.  These Care Teams include your primary Cardiologist (physician) and Advanced Practice Providers (APPs -  Physician Assistants and Nurse Practitioners) who all work together to provide you with the care you need, when you need it.   Your next appointment:   6 month(s)  Provider:   Nicki Guadalajara, MD

## 2023-08-21 DIAGNOSIS — I10 Essential (primary) hypertension: Secondary | ICD-10-CM | POA: Diagnosis not present

## 2023-08-22 LAB — COMPREHENSIVE METABOLIC PANEL WITH GFR
ALT: 18 [IU]/L (ref 0–32)
AST: 20 [IU]/L (ref 0–40)
Albumin: 4.5 g/dL (ref 3.6–4.6)
Alkaline Phosphatase: 54 [IU]/L (ref 44–121)
BUN/Creatinine Ratio: 20 (ref 12–28)
BUN: 17 mg/dL (ref 10–36)
Bilirubin Total: 0.4 mg/dL (ref 0.0–1.2)
CO2: 26 mmol/L (ref 20–29)
Calcium: 10.3 mg/dL (ref 8.7–10.3)
Chloride: 105 mmol/L (ref 96–106)
Creatinine, Ser: 0.84 mg/dL (ref 0.57–1.00)
Globulin, Total: 2.6 g/dL (ref 1.5–4.5)
Glucose: 102 mg/dL — ABNORMAL HIGH (ref 70–99)
Potassium: 4.9 mmol/L (ref 3.5–5.2)
Sodium: 142 mmol/L (ref 134–144)
Total Protein: 7.1 g/dL (ref 6.0–8.5)
eGFR: 66 mL/min/{1.73_m2}

## 2023-08-22 LAB — LIPID PANEL
Chol/HDL Ratio: 2.9 ratio (ref 0.0–4.4)
Cholesterol, Total: 165 mg/dL (ref 100–199)
HDL: 57 mg/dL (ref >39)
LDL Chol Calc (NIH): 87 mg/dL (ref 0–99)
Triglycerides: 119 mg/dL (ref 0–149)
VLDL Cholesterol Cal: 21 mg/dL (ref 5–40)

## 2023-08-22 LAB — CBC
Hematocrit: 43.9 % (ref 34.0–46.6)
Hemoglobin: 14.5 g/dL (ref 11.1–15.9)
MCH: 31.9 pg (ref 26.6–33.0)
MCHC: 33 g/dL (ref 31.5–35.7)
MCV: 97 fL (ref 79–97)
Platelets: 148 10*3/uL — ABNORMAL LOW (ref 150–450)
RBC: 4.55 x10E6/uL (ref 3.77–5.28)
RDW: 12.4 % (ref 11.7–15.4)
WBC: 5.6 10*3/uL (ref 3.4–10.8)

## 2023-08-22 LAB — TSH: TSH: 3.61 u[IU]/mL (ref 0.450–4.500)

## 2023-08-23 DIAGNOSIS — M25532 Pain in left wrist: Secondary | ICD-10-CM | POA: Diagnosis not present

## 2023-08-23 DIAGNOSIS — M25531 Pain in right wrist: Secondary | ICD-10-CM | POA: Diagnosis not present

## 2023-08-23 DIAGNOSIS — M1712 Unilateral primary osteoarthritis, left knee: Secondary | ICD-10-CM | POA: Diagnosis not present

## 2023-08-23 DIAGNOSIS — M1711 Unilateral primary osteoarthritis, right knee: Secondary | ICD-10-CM | POA: Diagnosis not present

## 2023-08-24 ENCOUNTER — Encounter: Payer: Self-pay | Admitting: Cardiovascular Disease

## 2023-09-11 DIAGNOSIS — L400 Psoriasis vulgaris: Secondary | ICD-10-CM | POA: Diagnosis not present

## 2023-09-14 DIAGNOSIS — R7989 Other specified abnormal findings of blood chemistry: Secondary | ICD-10-CM | POA: Diagnosis not present

## 2023-09-14 DIAGNOSIS — E119 Type 2 diabetes mellitus without complications: Secondary | ICD-10-CM | POA: Diagnosis not present

## 2023-09-14 DIAGNOSIS — I10 Essential (primary) hypertension: Secondary | ICD-10-CM | POA: Diagnosis not present

## 2023-10-06 DIAGNOSIS — E119 Type 2 diabetes mellitus without complications: Secondary | ICD-10-CM | POA: Diagnosis not present

## 2023-10-06 DIAGNOSIS — R7989 Other specified abnormal findings of blood chemistry: Secondary | ICD-10-CM | POA: Diagnosis not present

## 2023-10-06 DIAGNOSIS — I10 Essential (primary) hypertension: Secondary | ICD-10-CM | POA: Diagnosis not present

## 2023-10-28 ENCOUNTER — Other Ambulatory Visit: Payer: Self-pay

## 2023-10-28 DIAGNOSIS — E7800 Pure hypercholesterolemia, unspecified: Secondary | ICD-10-CM

## 2023-10-28 MED ORDER — EZETIMIBE 10 MG PO TABS: 10.0000 mg | ORAL_TABLET | Freq: Every day | ORAL | 3 refills | Status: DC

## 2023-12-11 ENCOUNTER — Other Ambulatory Visit: Payer: Self-pay

## 2023-12-11 MED ORDER — ROSUVASTATIN CALCIUM 10 MG PO TABS: 10.0000 mg | ORAL_TABLET | Freq: Every day | ORAL | 2 refills | Status: DC

## 2023-12-23 DIAGNOSIS — G35 Multiple sclerosis: Secondary | ICD-10-CM | POA: Diagnosis not present

## 2023-12-23 DIAGNOSIS — Z1379 Encounter for other screening for genetic and chromosomal anomalies: Secondary | ICD-10-CM | POA: Diagnosis not present

## 2024-03-26 ENCOUNTER — Ambulatory Visit: Attending: Cardiovascular Disease | Admitting: Cardiovascular Disease

## 2024-06-03 DIAGNOSIS — G35 Multiple sclerosis: Secondary | ICD-10-CM | POA: Diagnosis not present

## 2024-06-15 DIAGNOSIS — Z79891 Long term (current) use of opiate analgesic: Secondary | ICD-10-CM | POA: Diagnosis not present

## 2024-07-05 DIAGNOSIS — H109 Unspecified conjunctivitis: Secondary | ICD-10-CM | POA: Diagnosis not present

## 2024-10-28 ENCOUNTER — Ambulatory Visit (INDEPENDENT_AMBULATORY_CARE_PROVIDER_SITE_OTHER)

## 2024-10-28 ENCOUNTER — Ambulatory Visit: Admitting: Internal Medicine

## 2024-10-28 ENCOUNTER — Encounter: Payer: Self-pay | Admitting: Internal Medicine

## 2024-10-28 ENCOUNTER — Ambulatory Visit: Payer: Self-pay | Admitting: Internal Medicine

## 2024-10-28 VITALS — BP 127/88 | HR 72 | Temp 98.4°F | Wt 144.6 lb

## 2024-10-28 DIAGNOSIS — R600 Localized edema: Secondary | ICD-10-CM

## 2024-10-28 DIAGNOSIS — Z9861 Coronary angioplasty status: Secondary | ICD-10-CM | POA: Diagnosis not present

## 2024-10-28 DIAGNOSIS — I4891 Unspecified atrial fibrillation: Secondary | ICD-10-CM

## 2024-10-28 DIAGNOSIS — E7800 Pure hypercholesterolemia, unspecified: Secondary | ICD-10-CM

## 2024-10-28 DIAGNOSIS — R0609 Other forms of dyspnea: Secondary | ICD-10-CM

## 2024-10-28 DIAGNOSIS — E785 Hyperlipidemia, unspecified: Secondary | ICD-10-CM

## 2024-10-28 DIAGNOSIS — R7302 Impaired glucose tolerance (oral): Secondary | ICD-10-CM | POA: Diagnosis not present

## 2024-10-28 DIAGNOSIS — I2510 Atherosclerotic heart disease of native coronary artery without angina pectoris: Secondary | ICD-10-CM

## 2024-10-28 DIAGNOSIS — I10 Essential (primary) hypertension: Secondary | ICD-10-CM

## 2024-10-28 LAB — COMPREHENSIVE METABOLIC PANEL WITH GFR
ALT: 18 U/L (ref 3–35)
AST: 21 U/L (ref 5–37)
Albumin: 4.2 g/dL (ref 3.5–5.2)
Alkaline Phosphatase: 83 U/L (ref 39–117)
BUN: 16 mg/dL (ref 6–23)
CO2: 26 meq/L (ref 19–32)
Calcium: 9.9 mg/dL (ref 8.4–10.5)
Chloride: 102 meq/L (ref 96–112)
Creatinine, Ser: 0.85 mg/dL (ref 0.40–1.20)
GFR: 59.87 mL/min — ABNORMAL LOW
Glucose, Bld: 119 mg/dL — ABNORMAL HIGH (ref 70–99)
Potassium: 3.6 meq/L (ref 3.5–5.1)
Sodium: 136 meq/L (ref 135–145)
Total Bilirubin: 0.9 mg/dL (ref 0.2–1.2)
Total Protein: 7.8 g/dL (ref 6.0–8.3)

## 2024-10-28 LAB — CBC WITH DIFFERENTIAL/PLATELET
Basophils Absolute: 0 K/uL (ref 0.0–0.1)
Basophils Relative: 0.3 % (ref 0.0–3.0)
Eosinophils Absolute: 0.1 K/uL (ref 0.0–0.7)
Eosinophils Relative: 0.7 % (ref 0.0–5.0)
HCT: 42.5 % (ref 36.0–46.0)
Hemoglobin: 14 g/dL (ref 12.0–15.0)
Lymphocytes Relative: 14.4 % (ref 12.0–46.0)
Lymphs Abs: 1.6 K/uL (ref 0.7–4.0)
MCHC: 33 g/dL (ref 30.0–36.0)
MCV: 94.8 fl (ref 78.0–100.0)
Monocytes Absolute: 1.4 K/uL — ABNORMAL HIGH (ref 0.1–1.0)
Monocytes Relative: 12.9 % — ABNORMAL HIGH (ref 3.0–12.0)
Neutro Abs: 7.9 K/uL — ABNORMAL HIGH (ref 1.4–7.7)
Neutrophils Relative %: 71.7 % (ref 43.0–77.0)
Platelets: 149 K/uL — ABNORMAL LOW (ref 150.0–400.0)
RBC: 4.48 Mil/uL (ref 3.87–5.11)
RDW: 13.9 % (ref 11.5–15.5)
WBC: 11 K/uL — ABNORMAL HIGH (ref 4.0–10.5)

## 2024-10-28 LAB — LIPID PANEL
Cholesterol: 270 mg/dL — ABNORMAL HIGH (ref 28–200)
HDL: 51.2 mg/dL
LDL Cholesterol: 193 mg/dL — ABNORMAL HIGH (ref 10–99)
NonHDL: 218.47
Total CHOL/HDL Ratio: 5
Triglycerides: 127 mg/dL (ref 10.0–149.0)
VLDL: 25.4 mg/dL (ref 0.0–40.0)

## 2024-10-28 LAB — HEMOGLOBIN A1C: Hgb A1c MFr Bld: 6.3 % (ref 4.6–6.5)

## 2024-10-28 LAB — TSH: TSH: 2.19 u[IU]/mL (ref 0.35–5.50)

## 2024-10-28 MED ORDER — FUROSEMIDE 20 MG PO TABS: 40.0000 mg | ORAL_TABLET | Freq: Every day | ORAL | 1 refills | Status: DC

## 2024-10-28 MED ORDER — ROSUVASTATIN CALCIUM 10 MG PO TABS: 10.0000 mg | ORAL_TABLET | Freq: Every day | ORAL | 1 refills | Status: DC

## 2024-10-28 MED ORDER — METOPROLOL SUCCINATE ER 25 MG PO TB24: 25.0000 mg | ORAL_TABLET | Freq: Every day | ORAL | 1 refills | Status: AC

## 2024-10-28 MED ORDER — EZETIMIBE 10 MG PO TABS: 10.0000 mg | ORAL_TABLET | Freq: Every day | ORAL | 1 refills | Status: AC

## 2024-10-28 NOTE — Progress Notes (Signed)
 "    Established Patient Office Visit     CC/Reason for Visit: Tremors, shortness of breath, significant lower extremity edema  HPI: Rachel Nichols is a 89 y.o. female who is coming in today for the above mentioned reasons. Past Medical History is significant for: Coronary artery disease status post remote MI, hyperlipidemia, hypertension, impaired glucose tolerance, history of peptic ulcer disease/GERD.  I have not seen her since 2023.  Her husband unfortunately passed away over the last few months.  She is brought in today by her son who has concerns about her health.  She has had more falls lately.  She has significant bilateral lower extremity edema as well as dyspnea with minimal exertion.  She has not seen cardiology since 2024.  Significant tremors.  On exam vital signs are relatively normal with the exception of an O2 sat of 91% on room air.  She has not been taking Zetia , metoprolol , omeprazole  or rosuvastatin  as prescribed.  She denies any chest pain or dizziness.   Past Medical/Surgical History: Past Medical History:  Diagnosis Date   Adenomatous colon polyp 2002   Colonoscopy    Benign neoplasm of colon 2004   Colonoscopy   CAD (coronary artery disease)    echo 09/13/10- EF>55%, aortic valve is mildly sclerotic; myoview  02-20-12-no ischemia   Carotid bruit    carotid dopplers 05/16/04- normal   Diverticulosis 2002,2004,2007   Colonoscopy   Duodenitis    Fibrocystic breast disease    Gastritis    GERD (gastroesophageal reflux disease)    Heart attack (HCC) 1993   Cardiolyte stress---  stents 2012   Hiatal hernia    History of blood transfusion    pt denies   Hyperlipidemia    Peptic ulcer due to Helicobacter pylori    PUD (peptic ulcer disease)    SCC (squamous cell carcinoma) 04/24/2011   right jawline (CX35FU), left forearm (CX35FU)    Past Surgical History:  Procedure Laterality Date   ANGIOPLASTY  1993   APPENDECTOMY  1957   BASAL CELL CARCINOMA EXCISION      From the nose   CATARACT EXTRACTION  10/26/09   Right eye   CATARACT EXTRACTION  11/09/09   Left eye   CHOLECYSTECTOMY  1984   CORONARY STENT PLACEMENT  01/10/11   hig grade ramus branch stenosis with mod LAD dz, PCI/stenting of ramus branch with a Resolute DES    DILATION AND CURETTAGE OF UTERUS     FINGER SURGERY  04/2011   Cyst removal, right hand forefinger   LEFT OOPHORECTOMY  1957   SQUAMOUS CELL CARCINOMA EXCISION Right 07/24/2018   Posterior forearm shave & ED&C    Social History:  reports that she quit smoking about 49 years ago. Her smoking use included cigarettes. She started smoking about 64 years ago. She has a 30 pack-year smoking history. She has never used smokeless tobacco. She reports that she does not drink alcohol and does not use drugs.  Allergies: Allergies[1]  Family History:  Family History  Problem Relation Age of Onset   Heart disease Brother 59       MI   Heart disease Sister        stents   Neuropathy Sister    Hypertension Sister    Stroke Mother    Heart disease Father    Heart attack Father    Stroke Maternal Grandfather    Heart attack Paternal Grandmother    Heart attack Paternal Grandfather  Breast cancer Maternal Aunt        x 2 aunts   Irritable bowel syndrome Brother    Heart attack Brother    Epilepsy Daughter    Heart attack Paternal Aunt        x 9   Colon cancer Neg Hx     Current Medications[2]  Review of Systems:  Negative unless indicated in HPI.   Physical Exam: Vitals:   10/28/24 0912 10/28/24 0916  BP: (!) 140/80 127/88  Pulse: 72   Temp: 98.4 F (36.9 C)   TempSrc: Oral   SpO2: 91%   Weight: 144 lb 9.6 oz (65.6 kg)     Body mass index is 24.82 kg/m.   Physical Exam Vitals reviewed.  Constitutional:      Appearance: Normal appearance.  HENT:     Head: Normocephalic and atraumatic.  Eyes:     Conjunctiva/sclera: Conjunctivae normal.     Pupils: Pupils are equal, round, and reactive to light.   Cardiovascular:     Rate and Rhythm: Normal rate. Rhythm irregular.     Heart sounds: No murmur heard. Pulmonary:     Effort: Pulmonary effort is normal. Tachypnea present.     Breath sounds: Examination of the right-lower field reveals rhonchi. Examination of the left-lower field reveals rhonchi. Rhonchi present.  Musculoskeletal:     Right lower leg: Edema present.     Left lower leg: Edema present.  Skin:    General: Skin is warm and dry.  Neurological:     General: No focal deficit present.     Mental Status: She is alert and oriented to person, place, and time.  Psychiatric:        Mood and Affect: Mood normal.        Behavior: Behavior normal.        Thought Content: Thought content normal.        Judgment: Judgment normal.      Impression and Plan:  CAD S/P percutaneous coronary angioplasty -     ECHOCARDIOGRAM COMPLETE; Future -     Ambulatory referral to Cardiology  IGT (impaired glucose tolerance) -     Hemoglobin A1c; Future  Essential hypertension -     Metoprolol  Succinate ER; Take 1 tablet (25 mg total) by mouth daily.  Dispense: 90 tablet; Refill: 1 -     CBC with Differential/Platelet; Future -     Comprehensive metabolic panel with GFR; Future  Hyperlipidemia LDL goal <70 -     Ezetimibe ; Take 1 tablet (10 mg total) by mouth daily.  Dispense: 90 tablet; Refill: 1 -     Rosuvastatin  Calcium ; Take 1 tablet (10 mg total) by mouth daily.  Dispense: 90 tablet; Refill: 1 -     Lipid panel; Future  DOE (dyspnea on exertion) -     EKG 12-Lead -     Furosemide ; Take 2 tablets (40 mg total) by mouth daily.  Dispense: 60 tablet; Refill: 1 -     TSH; Future -     DG Chest 2 View; Future -     ECHOCARDIOGRAM COMPLETE; Future -     Ambulatory referral to Cardiology  Bilateral edema of lower extremity -     Furosemide ; Take 2 tablets (40 mg total) by mouth daily.  Dispense: 60 tablet; Refill: 1 -     ECHOCARDIOGRAM COMPLETE; Future -     Ambulatory referral to  Cardiology  Pure hypercholesterolemia -     Ezetimibe ; Take 1  tablet (10 mg total) by mouth daily.  Dispense: 90 tablet; Refill: 1  New onset atrial fibrillation (HCC)   - Concerned about her presentation today.  Suspect she has pulmonary edema, question her recent ejection fraction. - EKG done in office today shows: New onset atrial fibrillation with a rate of 74, no acute ST or T wave changes. - Start Lasix  40 mg twice daily, resume metoprolol  rosuvastatin  and Crestor , sent for stat labs, chest x-ray as well as 2D echo.  Will also place an urgent referral back to cardiology for evaluation.  She qualifies and should be on anticoagulation but will let her discuss this with cardiology. - I will give her a trial at outpatient management and will schedule her a follow-up in 1 week.  We have discussed reasons to go to the emergency department over the weekend would include increased shortness of breath, difficulty lying down flat, elevated heart rate with palpitations or chest pain.  Time spent:44 minutes reviewing chart, interviewing and examining patient and formulating plan of care.     Tully Theophilus Andrews, MD [facility]     [1]  Allergies Allergen Reactions   Diltiazem Hcl    Tape Other (See Comments)    It pulls the skin off.   Tramadol  Hcl   [2]  Current Outpatient Medications:    aspirin 81 MG tablet, Take 81 mg by mouth daily., Disp: , Rfl:    furosemide  (LASIX ) 20 MG tablet, Take 2 tablets (40 mg total) by mouth daily., Disp: 60 tablet, Rfl: 1   Multiple Vitamins-Minerals (CENTRUM SILVER ULTRA WOMENS) TABS, Take 1 tablet by mouth daily., Disp: , Rfl:    ezetimibe  (ZETIA ) 10 MG tablet, Take 1 tablet (10 mg total) by mouth daily., Disp: 90 tablet, Rfl: 1   metoprolol  succinate (TOPROL  XL) 25 MG 24 hr tablet, Take 1 tablet (25 mg total) by mouth daily., Disp: 90 tablet, Rfl: 1   rosuvastatin  (CRESTOR ) 10 MG tablet, Take 1 tablet (10 mg total) by  mouth daily., Disp: 90 tablet, Rfl: 1  "

## 2024-10-28 NOTE — Progress Notes (Unsigned)
"   °  °  Cardiology Office Note Date:  10/28/2024  ID:  Rachel Nichols, Rachel Nichols 1931-05-25, MRN [id number] PCP:  Theophilus Andrews, [name], MD  Cardiologist:  [name], MD  No chief complaint on file.    Problems CAD S/p RCA PCI 1993 S/p prox RI 2012 Medically treated 60% mid-LAD, 40-50% mid RCA MPI 2013: Negative TTE 11/23: 60-65%, GIIDD, severe LAE, mild RAE M: ASA81, EE10, FE40, XL25, RN10  Visits  1/26: LDL 193 1/26, BA + lifestyle, LP in 3 months    Discussed the use of AI scribe software for clinical note transcription with the patient, who gave verbal consent to proceed.  History of Present Illness     ROS: Please see the history of present illness. All other systems are reviewed and negative.   PHYSICAL EXAM: VS:  There were no vitals taken for this visit. , BMI There is no height or weight on file to calculate BMI. GEN: Well nourished, well developed, in no acute distress HEENT: normal Neck: no JVD, carotid bruits, or masses Cardiac: ***RRR; no murmurs, rubs, or gallops,no edema  Respiratory:  CTAB bilaterally, normal work of breathing GI: soft, nontender, nondistended, + BS Extremities: No LE edema Skin: warm and dry, no rash Neuro:  Strength and sensation are intact  EKG: ***  Recent Labs: Reviewed  Studies: Reviewed  Assessment and Plan Assessment & Plan      Signed, [name], MD  10/28/2024 7:12 PM    [facility] "

## 2024-10-29 ENCOUNTER — Ambulatory Visit

## 2024-11-03 NOTE — Telephone Encounter (Signed)
 3rd attempt   the call could not be completed at this time   Letter sent.

## 2024-11-04 ENCOUNTER — Ambulatory Visit: Admitting: Internal Medicine

## 2024-11-05 ENCOUNTER — Ambulatory Visit (INDEPENDENT_AMBULATORY_CARE_PROVIDER_SITE_OTHER): Admitting: Internal Medicine

## 2024-11-05 ENCOUNTER — Encounter: Payer: Self-pay | Admitting: Internal Medicine

## 2024-11-05 VITALS — BP 120/80 | HR 65 | Temp 98.1°F | Wt 140.3 lb

## 2024-11-05 DIAGNOSIS — E785 Hyperlipidemia, unspecified: Secondary | ICD-10-CM | POA: Diagnosis not present

## 2024-11-05 DIAGNOSIS — I10 Essential (primary) hypertension: Secondary | ICD-10-CM | POA: Diagnosis not present

## 2024-11-05 DIAGNOSIS — R7302 Impaired glucose tolerance (oral): Secondary | ICD-10-CM

## 2024-11-05 DIAGNOSIS — I5033 Acute on chronic diastolic (congestive) heart failure: Secondary | ICD-10-CM | POA: Diagnosis not present

## 2024-11-05 DIAGNOSIS — I4891 Unspecified atrial fibrillation: Secondary | ICD-10-CM | POA: Diagnosis not present

## 2024-11-05 LAB — BASIC METABOLIC PANEL WITH GFR
BUN: 19 mg/dL (ref 6–23)
CO2: 35 meq/L — ABNORMAL HIGH (ref 19–32)
Calcium: 10.7 mg/dL — ABNORMAL HIGH (ref 8.4–10.5)
Chloride: 95 meq/L — ABNORMAL LOW (ref 96–112)
Creatinine, Ser: 1.05 mg/dL (ref 0.40–1.20)
GFR: 46.46 mL/min — ABNORMAL LOW
Glucose, Bld: 136 mg/dL — ABNORMAL HIGH (ref 70–99)
Potassium: 3 meq/L — ABNORMAL LOW (ref 3.5–5.1)
Sodium: 139 meq/L (ref 135–145)

## 2024-11-05 NOTE — Progress Notes (Signed)
 "    Established Patient Office Visit     CC/Reason for Visit: 1 week follow-up  HPI: Rachel Nichols is a 89 y.o. female who is coming in today for the above mentioned reasons. Past Medical History is significant for: New onset A-fib, acute on chronic diastolic heart failure with significant lower extremity edema, history of coronary artery disease, hypertension, hyperlipidemia, impaired glucose tolerance as well as peptic ulcer disease and GERD.  She was seen 1 week ago as an acute visit with tremors, shortness of breath and significant lower extremity edema.  She was mildly hypoxic, had significant bilateral lower extremity edema up to her thighs, she was found to be in new onset A-fib.  She had not been taking medications or had medical care since 2023.  We put her on twice daily Lasix , metoprolol , her statin.  Placed a stat referral to cardiology and for an echocardiogram.  It appears that she canceled that visit as she was not feeling well.  She has not yet had her echocardiogram.   Past Medical/Surgical History: Past Medical History:  Diagnosis Date   Adenomatous colon polyp 2002   Colonoscopy    Benign neoplasm of colon 2004   Colonoscopy   CAD (coronary artery disease)    echo 09/13/10- EF>55%, aortic valve is mildly sclerotic; myoview  02-20-12-no ischemia   Carotid bruit    carotid dopplers 05/16/04- normal   Diverticulosis 2002,2004,2007   Colonoscopy   Duodenitis    Fibrocystic breast disease    Gastritis    GERD (gastroesophageal reflux disease)    Heart attack (HCC) 1993   Cardiolyte stress---  stents 2012   Hiatal hernia    History of blood transfusion    pt denies   Hyperlipidemia    Peptic ulcer due to Helicobacter pylori    PUD (peptic ulcer disease)    SCC (squamous cell carcinoma) 04/24/2011   right jawline (CX35FU), left forearm (CX35FU)    Past Surgical History:  Procedure Laterality Date   ANGIOPLASTY  1993   APPENDECTOMY  1957   BASAL CELL CARCINOMA  EXCISION     From the nose   CATARACT EXTRACTION  10/26/09   Right eye   CATARACT EXTRACTION  11/09/09   Left eye   CHOLECYSTECTOMY  1984   CORONARY STENT PLACEMENT  01/10/11   hig grade ramus branch stenosis with mod LAD dz, PCI/stenting of ramus branch with a Resolute DES    DILATION AND CURETTAGE OF UTERUS     FINGER SURGERY  04/2011   Cyst removal, right hand forefinger   LEFT OOPHORECTOMY  1957   SQUAMOUS CELL CARCINOMA EXCISION Right 07/24/2018   Posterior forearm shave & ED&C    Social History:  reports that she quit smoking about 49 years ago. Her smoking use included cigarettes. She started smoking about 64 years ago. She has a 30 pack-year smoking history. She has never used smokeless tobacco. She reports that she does not drink alcohol and does not use drugs.  Allergies: Allergies[1]  Family History:  Family History  Problem Relation Age of Onset   Heart disease Brother 40       MI   Heart disease Sister        stents   Neuropathy Sister    Hypertension Sister    Stroke Mother    Heart disease Father    Heart attack Father    Stroke Maternal Grandfather    Heart attack Paternal Grandmother    Heart attack Paternal  Grandfather    Breast cancer Maternal Aunt        x 2 aunts   Irritable bowel syndrome Brother    Heart attack Brother    Epilepsy Daughter    Heart attack Paternal Aunt        x 9   Colon cancer Neg Hx     Current Medications[2]  Review of Systems:  Negative unless indicated in HPI.   Physical Exam: Vitals:   11/05/24 0927  BP: 120/80  Pulse: 65  Temp: 98.1 F (36.7 C)  TempSrc: Oral  SpO2: 95%  Weight: 140 lb 4.8 oz (63.6 kg)    Body mass index is 24.08 kg/m.   Physical Exam Vitals reviewed.  Constitutional:      Appearance: Normal appearance.  HENT:     Head: Normocephalic and atraumatic.  Eyes:     Conjunctiva/sclera: Conjunctivae normal.  Cardiovascular:     Rate and Rhythm: Normal rate. Rhythm irregular.  Pulmonary:      Effort: Pulmonary effort is normal.     Breath sounds: Normal breath sounds.  Musculoskeletal:     Right lower leg: Edema present.     Left lower leg: Edema present.  Skin:    General: Skin is warm and dry.  Neurological:     General: No focal deficit present.     Mental Status: She is alert and oriented to person, place, and time.  Psychiatric:        Mood and Affect: Mood normal.        Behavior: Behavior normal.        Thought Content: Thought content normal.        Judgment: Judgment normal.      Impression and Plan:  New onset atrial fibrillation (HCC) -     Basic metabolic panel with GFR; Future  Essential hypertension  Hyperlipidemia LDL goal <70  IGT (impaired glucose tolerance)  Acute on chronic diastolic CHF (congestive heart failure) (HCC)   - Seems a little improved from last week, tremors have subsided and she is no longer hypoxic.  While still has significant lower extremity edema this seems to have improved as well.  We have given her the number for cardiology to schedule the urgent appointment as well as for her echocardiogram.  She certainly qualifies for anticoagulation but again will let cardiology weigh in on this decision given her age and propensity for falls..  Blood pressure is well-controlled.  Time spent:31 minutes reviewing chart, interviewing and examining patient and formulating plan of care.     Tully Theophilus Andrews, MD [facility]     [1]  Allergies Allergen Reactions   Diltiazem Hcl    Tape Other (See Comments)    It pulls the skin off.   Tramadol  Hcl   [2]  Current Outpatient Medications:    aspirin 81 MG tablet, Take 81 mg by mouth daily., Disp: , Rfl:    ezetimibe  (ZETIA ) 10 MG tablet, Take 1 tablet (10 mg total) by mouth daily., Disp: 90 tablet, Rfl: 1   furosemide  (LASIX ) 20 MG tablet, Take 2 tablets (40 mg total) by mouth daily., Disp: 60 tablet, Rfl: 1   metoprolol  succinate (TOPROL  XL) 25 MG  24 hr tablet, Take 1 tablet (25 mg total) by mouth daily., Disp: 90 tablet, Rfl: 1   Multiple Vitamins-Minerals (CENTRUM SILVER ULTRA WOMENS) TABS, Take 1 tablet by mouth daily., Disp: , Rfl:    rosuvastatin  (CRESTOR ) 10 MG tablet, Take 1  tablet (10 mg total) by mouth daily., Disp: 90 tablet, Rfl: 1  "

## 2024-11-09 NOTE — Progress Notes (Unsigned)
" °   °  °  Cardiology Office Note Date:  11/09/2024  ID:  Rachel, Nichols 15-Dec-1930, MRN [id number] PCP:  Rachel Nichols, [name], MD  Cardiologist:  Rachel VEAR Ren Donley, MD  No chief complaint on file.     Problems CAD S/p RCA PCI 1993 S/p prox RI 2012 Medically treated 60% mid-LAD, 40-50% mid RCA MPI 2013: Negative TTE 11/23: 60-65%, GIIDD, severe LAE, mild RAE New onset Afib New onset HFpEF Former tobacco use- 30 pack year M: ASA81, EE10, FE40, XL25, RN10  Visits 1/26 PCP: new dyspnea, LE edema to thighs, and new Afib; had not been taking meds since 2023 --> started on BID lasix , metop, and home statin  1/26: LDL 193 1/26, increase RN or BA, LP in 3 months, TTE (ordered); PET stress     ROS: Otherwise negative Discussed the use of AI scribe software for clinical note transcription with the patient, who gave verbal consent to proceed.  History of Present Illness     Physical Exam VS:  There were no vitals taken for this visit. , BMI There is no height or weight on file to calculate BMI. GEN: Well nourished, well developed, in no acute distress HEENT: normal Neck: no JVD, carotid bruits, or masses Cardiac: ***RRR; no murmurs, rubs, or gallops,no edema  Respiratory:  CTAB bilaterally, normal work of breathing GI: soft, nontender, nondistended, + BS Extremities: No LE edema Skin: warm and dry, no rash Neuro:  Strength and sensation are intact  Recent Labs: Reviewed  Assessment and Plan Assessment & Plan      Signed, Rachel VEAR Ren Donley, MD  11/09/2024 8:53 AM    [facility] "

## 2024-11-10 ENCOUNTER — Ambulatory Visit: Payer: Self-pay | Admitting: Internal Medicine

## 2024-11-10 DIAGNOSIS — R0609 Other forms of dyspnea: Secondary | ICD-10-CM

## 2024-11-10 DIAGNOSIS — E876 Hypokalemia: Secondary | ICD-10-CM

## 2024-11-10 DIAGNOSIS — R600 Localized edema: Secondary | ICD-10-CM

## 2024-11-10 MED ORDER — POTASSIUM CHLORIDE CRYS ER 20 MEQ PO TBCR: 20.0000 meq | EXTENDED_RELEASE_TABLET | Freq: Two times a day (BID) | ORAL | 0 refills | Status: AC

## 2024-11-10 MED ORDER — FUROSEMIDE 20 MG PO TABS: 20.0000 mg | ORAL_TABLET | Freq: Every day | ORAL | 1 refills | Status: AC

## 2024-11-11 ENCOUNTER — Telehealth: Payer: Self-pay | Admitting: *Deleted

## 2024-11-11 MED ORDER — ROSUVASTATIN CALCIUM 10 MG PO TABS: 10.0000 mg | ORAL_TABLET | Freq: Every day | ORAL | 1 refills | Status: AC

## 2024-11-11 NOTE — Telephone Encounter (Signed)
 Spoke to son and reviewed lab results.  See lab result note.

## 2024-11-11 NOTE — Addendum Note (Signed)
 Addended by: KATHRYNE MILLMAN B on: 11/11/2024 02:45 PM   Modules accepted: Orders

## 2024-11-11 NOTE — Telephone Encounter (Signed)
 Copied from CRM #[id number]. Topic: Clinical - Lab/Test Results >> Nov 11, 2024 12:26 PM Alfonso HERO wrote: Reason for CRM: patient son in law Lyndy calling about a letter rcvd that the office hasn't been able to reach the patient to review her xray results. Asking for a callback at [phone]

## 2024-11-12 ENCOUNTER — Ambulatory Visit

## 2024-11-12 DIAGNOSIS — I252 Old myocardial infarction: Secondary | ICD-10-CM

## 2024-11-12 DIAGNOSIS — I358 Other nonrheumatic aortic valve disorders: Secondary | ICD-10-CM

## 2024-11-12 DIAGNOSIS — E7800 Pure hypercholesterolemia, unspecified: Secondary | ICD-10-CM

## 2024-11-12 DIAGNOSIS — I2510 Atherosclerotic heart disease of native coronary artery without angina pectoris: Secondary | ICD-10-CM

## 2024-11-12 DIAGNOSIS — I5030 Unspecified diastolic (congestive) heart failure: Secondary | ICD-10-CM

## 2024-11-12 DIAGNOSIS — E785 Hyperlipidemia, unspecified: Secondary | ICD-10-CM

## 2024-11-12 DIAGNOSIS — I10 Essential (primary) hypertension: Secondary | ICD-10-CM

## 2024-11-24 ENCOUNTER — Other Ambulatory Visit

## 2025-02-08 ENCOUNTER — Other Ambulatory Visit
